# Patient Record
Sex: Female | Born: 1935 | Race: White | Hispanic: No | Marital: Single | Smoking: Never smoker
Health system: Southern US, Community
[De-identification: ages and names within clinical notes are randomized; demographics above are authoritative.]

## PROBLEM LIST (undated history)

## (undated) DIAGNOSIS — M810 Age-related osteoporosis without current pathological fracture: Secondary | ICD-10-CM

## (undated) HISTORY — PX: OTHER SURGICAL HISTORY: SHX169

## (undated) HISTORY — PX: ABDOMINAL SURGERY: SHX537

---

## 2017-06-25 ENCOUNTER — Inpatient Hospital Stay (HOSPITAL_COMMUNITY): Payer: Medicare Other

## 2017-06-25 ENCOUNTER — Inpatient Hospital Stay (HOSPITAL_COMMUNITY)
Admission: EM | Admit: 2017-06-25 | Discharge: 2017-06-28 | DRG: 480 | Disposition: A | Discharge status: Skilled Nursing Facility | Payer: Medicare Other | Attending: Internal Medicine | Admitting: Internal Medicine

## 2017-06-25 ENCOUNTER — Encounter (HOSPITAL_COMMUNITY): Payer: Self-pay

## 2017-06-25 ENCOUNTER — Other Ambulatory Visit: Payer: Self-pay

## 2017-06-25 ENCOUNTER — Emergency Department (HOSPITAL_COMMUNITY): Payer: Medicare Other

## 2017-06-25 ENCOUNTER — Inpatient Hospital Stay (HOSPITAL_COMMUNITY): Payer: Medicare Other | Admitting: Certified Registered"

## 2017-06-25 ENCOUNTER — Encounter (HOSPITAL_COMMUNITY)
Admission: EM | Disposition: A | Discharge status: Skilled Nursing Facility | Payer: Self-pay | Source: Home / Self Care | Attending: Internal Medicine

## 2017-06-25 DIAGNOSIS — D696 Thrombocytopenia, unspecified: Secondary | ICD-10-CM | POA: Diagnosis present

## 2017-06-25 DIAGNOSIS — W010XXA Fall on same level from slipping, tripping and stumbling without subsequent striking against object, initial encounter: Secondary | ICD-10-CM | POA: Diagnosis present

## 2017-06-25 DIAGNOSIS — D72829 Elevated white blood cell count, unspecified: Secondary | ICD-10-CM | POA: Diagnosis present

## 2017-06-25 DIAGNOSIS — E43 Unspecified severe protein-calorie malnutrition: Secondary | ICD-10-CM | POA: Diagnosis present

## 2017-06-25 DIAGNOSIS — S72009A Fracture of unspecified part of neck of unspecified femur, initial encounter for closed fracture: Secondary | ICD-10-CM | POA: Diagnosis present

## 2017-06-25 DIAGNOSIS — S72001A Fracture of unspecified part of neck of right femur, initial encounter for closed fracture: Secondary | ICD-10-CM | POA: Diagnosis not present

## 2017-06-25 DIAGNOSIS — S72141A Displaced intertrochanteric fracture of right femur, initial encounter for closed fracture: Principal | ICD-10-CM | POA: Diagnosis present

## 2017-06-25 DIAGNOSIS — Z79899 Other long term (current) drug therapy: Secondary | ICD-10-CM

## 2017-06-25 DIAGNOSIS — Z09 Encounter for follow-up examination after completed treatment for conditions other than malignant neoplasm: Secondary | ICD-10-CM

## 2017-06-25 DIAGNOSIS — D62 Acute posthemorrhagic anemia: Secondary | ICD-10-CM | POA: Diagnosis present

## 2017-06-25 DIAGNOSIS — Y92008 Other place in unspecified non-institutional (private) residence as the place of occurrence of the external cause: Secondary | ICD-10-CM | POA: Diagnosis not present

## 2017-06-25 DIAGNOSIS — Z681 Body mass index (BMI) 19 or less, adult: Secondary | ICD-10-CM

## 2017-06-25 DIAGNOSIS — Z8781 Personal history of (healed) traumatic fracture: Secondary | ICD-10-CM

## 2017-06-25 HISTORY — PX: INTRAMEDULLARY (IM) NAIL INTERTROCHANTERIC: SHX5875

## 2017-06-25 LAB — COMPREHENSIVE METABOLIC PANEL
ALBUMIN: 3.8 g/dL (ref 3.5–5.0)
ALT: 21 U/L (ref 14–54)
AST: 28 U/L (ref 15–41)
Alkaline Phosphatase: 49 U/L (ref 38–126)
Anion gap: 8 (ref 5–15)
BILIRUBIN TOTAL: 0.9 mg/dL (ref 0.3–1.2)
BUN: 30 mg/dL — ABNORMAL HIGH (ref 6–20)
CHLORIDE: 104 mmol/L (ref 101–111)
CO2: 26 mmol/L (ref 22–32)
Calcium: 8.9 mg/dL (ref 8.9–10.3)
Creatinine, Ser: 0.66 mg/dL (ref 0.44–1.00)
GFR calc Af Amer: >60 mL/min (ref >60)
GLUCOSE: 150 mg/dL — AB (ref 65–99)
POTASSIUM: 3.7 mmol/L (ref 3.5–5.1)
Sodium: 138 mmol/L (ref 135–145)
TOTAL PROTEIN: 6.1 g/dL — AB (ref 6.5–8.1)

## 2017-06-25 LAB — CBC WITH DIFFERENTIAL/PLATELET
BASOS ABS: 0 10*3/uL (ref 0.0–0.1)
Basophils Relative: 0 %
Eosinophils Absolute: 0 10*3/uL (ref 0.0–0.7)
Eosinophils Relative: 0 %
HEMATOCRIT: 35 % — AB (ref 36.0–46.0)
HEMOGLOBIN: 11.6 g/dL — AB (ref 12.0–15.0)
LYMPHS PCT: 4 %
Lymphs Abs: 0.6 10*3/uL — ABNORMAL LOW (ref 0.7–4.0)
MCH: 31.1 pg (ref 26.0–34.0)
MCHC: 33.1 g/dL (ref 30.0–36.0)
MCV: 93.8 fL (ref 78.0–100.0)
Monocytes Absolute: 0.7 10*3/uL (ref 0.1–1.0)
Monocytes Relative: 5 %
NEUTROS ABS: 13.4 10*3/uL — AB (ref 1.7–7.7)
Neutrophils Relative %: 91 %
Platelets: 137 10*3/uL — ABNORMAL LOW (ref 150–400)
RBC: 3.73 MIL/uL — AB (ref 3.87–5.11)
RDW: 12.3 % (ref 11.5–15.5)
WBC: 14.8 10*3/uL — AB (ref 4.0–10.5)

## 2017-06-25 LAB — IRON AND TIBC
IRON: 79 ug/dL (ref 28–170)
Saturation Ratios: 23 % (ref 10.4–31.8)
TIBC: 349 ug/dL (ref 250–450)
UIBC: 270 ug/dL

## 2017-06-25 LAB — FERRITIN: FERRITIN: 148 ng/mL (ref 11–307)

## 2017-06-25 SURGERY — FIXATION, FRACTURE, INTERTROCHANTERIC, WITH INTRAMEDULLARY ROD
Anesthesia: Spinal | Laterality: Right

## 2017-06-25 MED ORDER — ONDANSETRON HCL 4 MG/2ML IJ SOLN
INTRAMUSCULAR | Status: AC
  Filled 2017-06-25: qty 2

## 2017-06-25 MED ORDER — CEFAZOLIN SODIUM-DEXTROSE 2-4 GM/100ML-% IV SOLN
2.0000 g | Freq: Four times a day (QID) | INTRAVENOUS | Status: AC
  Administered 2017-06-26 (×2): 2 g via INTRAVENOUS
  Filled 2017-06-25 (×2): qty 100

## 2017-06-25 MED ORDER — ENOXAPARIN SODIUM 40 MG/0.4ML ~~LOC~~ SOLN: 40.0000 mg | SUBCUTANEOUS | Status: DC

## 2017-06-25 MED ORDER — FENTANYL CITRATE (PF) 100 MCG/2ML IJ SOLN
INTRAMUSCULAR | Status: DC | PRN
  Administered 2017-06-25 (×2): 25 ug via INTRAVENOUS
  Administered 2017-06-25: 50 ug via INTRAVENOUS

## 2017-06-25 MED ORDER — ONDANSETRON HCL 4 MG/2ML IJ SOLN: 4.0000 mg | Freq: Once | INTRAMUSCULAR | Status: DC | PRN

## 2017-06-25 MED ORDER — SODIUM CHLORIDE 0.9 % IR SOLN
Status: DC | PRN
  Administered 2017-06-25: 1000 mL

## 2017-06-25 MED ORDER — ENOXAPARIN SODIUM 40 MG/0.4ML ~~LOC~~ SOLN
40.0000 mg | SUBCUTANEOUS | Status: DC
  Administered 2017-06-26 – 2017-06-28 (×3): 40 mg via SUBCUTANEOUS
  Filled 2017-06-25 (×3): qty 0.4

## 2017-06-25 MED ORDER — DOCUSATE SODIUM 100 MG PO CAPS
100.0000 mg | ORAL_CAPSULE | Freq: Two times a day (BID) | ORAL | Status: DC
  Administered 2017-06-26 – 2017-06-27 (×3): 100 mg via ORAL
  Filled 2017-06-25 (×5): qty 1

## 2017-06-25 MED ORDER — CEFAZOLIN SODIUM-DEXTROSE 2-4 GM/100ML-% IV SOLN
2.0000 g | INTRAVENOUS | Status: AC
  Administered 2017-06-25: 2 g via INTRAVENOUS

## 2017-06-25 MED ORDER — MENTHOL 3 MG MT LOZG: 1.0000 | LOZENGE | OROMUCOSAL | Status: DC | PRN

## 2017-06-25 MED ORDER — POVIDONE-IODINE 10 % EX SWAB: 2.0000 "application " | Freq: Once | CUTANEOUS | Status: DC

## 2017-06-25 MED ORDER — MEPERIDINE HCL 50 MG/ML IJ SOLN: 6.2500 mg | INTRAMUSCULAR | Status: DC | PRN

## 2017-06-25 MED ORDER — SODIUM CHLORIDE 0.9 % IV BOLUS
500.0000 mL | Freq: Once | INTRAVENOUS | Status: AC
  Administered 2017-06-25: 500 mL via INTRAVENOUS

## 2017-06-25 MED ORDER — FENTANYL CITRATE (PF) 100 MCG/2ML IJ SOLN
50.0000 ug | Freq: Once | INTRAMUSCULAR | Status: AC
  Administered 2017-06-25: 50 ug via INTRAVENOUS
  Filled 2017-06-25: qty 2

## 2017-06-25 MED ORDER — HYDROCODONE-ACETAMINOPHEN 5-325 MG PO TABS
1.0000 | ORAL_TABLET | Freq: Four times a day (QID) | ORAL | Status: DC | PRN
  Administered 2017-06-26: 1 via ORAL
  Filled 2017-06-25: qty 1

## 2017-06-25 MED ORDER — ACETAMINOPHEN 325 MG PO TABS
325.0000 mg | ORAL_TABLET | Freq: Four times a day (QID) | ORAL | Status: DC | PRN
  Administered 2017-06-28 (×2): 650 mg via ORAL
  Filled 2017-06-25 (×2): qty 2

## 2017-06-25 MED ORDER — CHLORHEXIDINE GLUCONATE 4 % EX LIQD
60.0000 mL | Freq: Once | CUTANEOUS | Status: DC
  Filled 2017-06-25: qty 60

## 2017-06-25 MED ORDER — METOCLOPRAMIDE HCL 5 MG PO TABS: 5.0000 mg | ORAL_TABLET | Freq: Three times a day (TID) | ORAL | Status: DC | PRN

## 2017-06-25 MED ORDER — SODIUM CHLORIDE 0.9 % IV SOLN
INTRAVENOUS | Status: DC | PRN
  Administered 2017-06-25: 20:00:00 via INTRAVENOUS

## 2017-06-25 MED ORDER — CEFAZOLIN SODIUM-DEXTROSE 2-4 GM/100ML-% IV SOLN: 2.0000 g | Freq: Four times a day (QID) | INTRAVENOUS | Status: DC

## 2017-06-25 MED ORDER — FENTANYL CITRATE (PF) 100 MCG/2ML IJ SOLN
25.0000 ug | INTRAMUSCULAR | Status: DC | PRN
  Administered 2017-06-25 (×2): 25 ug via INTRAVENOUS

## 2017-06-25 MED ORDER — ONDANSETRON HCL 4 MG/2ML IJ SOLN: 4.0000 mg | Freq: Four times a day (QID) | INTRAMUSCULAR | Status: DC | PRN

## 2017-06-25 MED ORDER — PROPOFOL 10 MG/ML IV BOLUS
INTRAVENOUS | Status: AC
  Filled 2017-06-25: qty 20

## 2017-06-25 MED ORDER — PROPOFOL 500 MG/50ML IV EMUL
INTRAVENOUS | Status: DC | PRN
  Administered 2017-06-25: 50 ug/kg/min via INTRAVENOUS

## 2017-06-25 MED ORDER — PHENYLEPHRINE 40 MCG/ML (10ML) SYRINGE FOR IV PUSH (FOR BLOOD PRESSURE SUPPORT)
PREFILLED_SYRINGE | INTRAVENOUS | Status: DC | PRN
  Administered 2017-06-25: 40 ug via INTRAVENOUS
  Administered 2017-06-25: 80 ug via INTRAVENOUS
  Administered 2017-06-25: 40 ug via INTRAVENOUS

## 2017-06-25 MED ORDER — LIDOCAINE 2% (20 MG/ML) 5 ML SYRINGE
INTRAMUSCULAR | Status: DC | PRN
  Administered 2017-06-25: 100 mg via INTRAVENOUS

## 2017-06-25 MED ORDER — POLYETHYLENE GLYCOL 3350 17 G PO PACK: 17.0000 g | PACK | Freq: Every day | ORAL | Status: DC | PRN

## 2017-06-25 MED ORDER — FENTANYL CITRATE (PF) 100 MCG/2ML IJ SOLN
INTRAMUSCULAR | Status: AC
Start: 2017-06-25 — End: ?
  Filled 2017-06-25: qty 2

## 2017-06-25 MED ORDER — CEFAZOLIN SODIUM-DEXTROSE 2-4 GM/100ML-% IV SOLN
INTRAVENOUS | Status: AC
  Filled 2017-06-25: qty 100

## 2017-06-25 MED ORDER — ONDANSETRON HCL 4 MG/2ML IJ SOLN
INTRAMUSCULAR | Status: DC | PRN
  Administered 2017-06-25: 4 mg via INTRAVENOUS

## 2017-06-25 MED ORDER — PHENOL 1.4 % MT LIQD
1.0000 | OROMUCOSAL | Status: DC | PRN
  Filled 2017-06-25: qty 177

## 2017-06-25 MED ORDER — METOCLOPRAMIDE HCL 5 MG/ML IJ SOLN: 5.0000 mg | Freq: Three times a day (TID) | INTRAMUSCULAR | Status: DC | PRN

## 2017-06-25 MED ORDER — BUPIVACAINE IN DEXTROSE 0.75-8.25 % IT SOLN
INTRATHECAL | Status: DC | PRN
  Administered 2017-06-25: 1.5 mL via INTRATHECAL

## 2017-06-25 MED ORDER — FENTANYL CITRATE (PF) 100 MCG/2ML IJ SOLN
INTRAMUSCULAR | Status: AC
  Filled 2017-06-25: qty 2

## 2017-06-25 MED ORDER — PROPOFOL 10 MG/ML IV BOLUS
INTRAVENOUS | Status: AC
Start: 2017-06-25 — End: ?
  Filled 2017-06-25: qty 20

## 2017-06-25 MED ORDER — ONDANSETRON HCL 4 MG PO TABS: 4.0000 mg | ORAL_TABLET | Freq: Four times a day (QID) | ORAL | Status: DC | PRN

## 2017-06-25 MED ORDER — MORPHINE SULFATE (PF) 2 MG/ML IV SOLN
0.5000 mg | INTRAVENOUS | Status: DC | PRN
Start: 2017-06-25 — End: 2017-06-28

## 2017-06-25 MED ORDER — PROPOFOL 10 MG/ML IV BOLUS
INTRAVENOUS | Status: DC | PRN
  Administered 2017-06-25: 10 mg via INTRAVENOUS
  Administered 2017-06-25: 30 mg via INTRAVENOUS

## 2017-06-25 MED ORDER — SODIUM CHLORIDE 0.9 % IV SOLN
INTRAVENOUS | Status: DC | PRN
  Administered 2017-06-25: 100 mL via INTRAVENOUS

## 2017-06-25 SURGICAL SUPPLY — 34 items
BAG ZIPLOCK 12X15 (MISCELLANEOUS) IMPLANT
BIT DRILL CANN LG 4.3MM (BIT) ×1 IMPLANT
CHLORAPREP W/TINT 26ML (MISCELLANEOUS) ×2 IMPLANT
COVER PERINEAL POST (MISCELLANEOUS) ×2 IMPLANT
COVER SURGICAL LIGHT HANDLE (MISCELLANEOUS) ×2 IMPLANT
DERMABOND ADVANCED (GAUZE/BANDAGES/DRESSINGS) ×1
DERMABOND ADVANCED .7 DNX12 (GAUZE/BANDAGES/DRESSINGS) ×1 IMPLANT
DRAPE C-ARM 42X120 X-RAY (DRAPES) ×2 IMPLANT
DRAPE C-ARMOR (DRAPES) ×2 IMPLANT
DRAPE SHEET LG 3/4 BI-LAMINATE (DRAPES) ×2 IMPLANT
DRAPE STERI IOBAN 125X83 (DRAPES) ×2 IMPLANT
DRAPE U-SHAPE 47X51 STRL (DRAPES) ×4 IMPLANT
DRILL BIT CANN LG 4.3MM (BIT) ×2
DRSG MEPILEX BORDER 4X4 (GAUZE/BANDAGES/DRESSINGS) ×2 IMPLANT
DRSG MEPILEX BORDER 4X8 (GAUZE/BANDAGES/DRESSINGS) ×2 IMPLANT
ELECT BLADE TIP CTD 4 INCH (ELECTRODE) IMPLANT
FACESHIELD WRAPAROUND (MASK) ×2 IMPLANT
GAUZE SPONGE 4X4 12PLY STRL (GAUZE/BANDAGES/DRESSINGS) ×2 IMPLANT
GLOVE BIO SURGEON STRL SZ8.5 (GLOVE) ×4 IMPLANT
GLOVE BIOGEL PI IND STRL 8.5 (GLOVE) ×1 IMPLANT
GLOVE BIOGEL PI INDICATOR 8.5 (GLOVE) ×1
GOWN SPEC L3 XXLG W/TWL (GOWN DISPOSABLE) ×2 IMPLANT
GUIDEPIN 3.2X17.5 THRD DISP (PIN) ×2 IMPLANT
HIP FRA NAIL LAG SCREW 10.5X90 (Orthopedic Implant) ×2 IMPLANT
KIT BASIN OR (CUSTOM PROCEDURE TRAY) ×2 IMPLANT
MANIFOLD NEPTUNE II (INSTRUMENTS) ×2 IMPLANT
MARKER SKIN DUAL TIP RULER LAB (MISCELLANEOUS) ×2 IMPLANT
NAIL HIP FRACT 130D 11X180 (Screw) ×2 IMPLANT
PACK TOTAL JOINT WL LF (CUSTOM PROCEDURE TRAY) ×2 IMPLANT
SCREW BONE CORTICAL 5.0X3 (Screw) ×2 IMPLANT
SCREW LAG HIP FRA NAIL 10.5X90 (Orthopedic Implant) ×1 IMPLANT
SUT MNCRL AB 3-0 PS2 18 (SUTURE) ×2 IMPLANT
SUT VIC AB 1 CT1 36 (SUTURE) ×2 IMPLANT
YANKAUER SUCT BULB TIP NO VENT (SUCTIONS) IMPLANT

## 2017-06-25 NOTE — ED Notes (Signed)
Orthopedic MD at bedside with patient and family. 

## 2017-06-25 NOTE — Anesthesia Postprocedure Evaluation (Signed)
Anesthesia Post Note  Patient: Toya Smothersileen Mae Needles  Procedure(s) Performed: INTRAMEDULLARY (IM) NAIL INTERTROCHANTRIC (Right )     Patient location during evaluation: PACU Anesthesia Type: Spinal Level of consciousness: oriented and awake and alert Pain management: pain level controlled Vital Signs Assessment: post-procedure vital signs reviewed and stable Respiratory status: spontaneous breathing, respiratory function stable and nonlabored ventilation Cardiovascular status: blood pressure returned to baseline and stable Postop Assessment: no headache, no backache, no apparent nausea or vomiting, patient able to bend at knees and spinal receding Anesthetic complications: no    Last Vitals:  Vitals:   06/25/17 2200 06/25/17 2218  BP: (!) 129/54 134/60  Pulse: 68 72  Resp:  16  Temp: 36.7 C (!) 36.3 C  SpO2: 99% 100%    Last Pain:  Vitals:   06/25/17 2200  TempSrc:   PainSc: Asleep                 Jesseka Drinkard A.

## 2017-06-25 NOTE — Op Note (Signed)
OPERATIVE REPORT  SURGEON: Samson FredericBrian Kerryn Tennant, MD   ASSISTANT: Staff.  PREOPERATIVE DIAGNOSIS: Right intertrochanteric femur fracture.   POSTOPERATIVE DIAGNOSIS: Right intertrochanteric femur fracture.   PROCEDURE: Intramedullary fixation, Right femur.   IMPLANTS: Biomet Affixus Hip Fracture Nail, 11 by 180 mm, 130 degrees. 10.5 x 90 mm Hip Fracture Nail Lag Screw. 5 x 34 mm distal interlocking screw 1.  ANESTHESIA:  Spinal  ESTIMATED BLOOD LOSS:-50 mL    ANTIBIOTICS: 2 g Ancef.  DRAINS: None.  COMPLICATIONS: None.   CONDITION: PACU - hemodynamically stable.Marland Kitchen.   BRIEF CLINICAL NOTE: Christine Jennings is a 84 y.o. female who presented with an intertrochanteric femur fracture. The patient was admitted to the hospitalist service and underwent perioperative risk stratification and medical optimization. The risks, benefits, and alternatives to the procedure were explained, and the patient elected to proceed.  PROCEDURE IN DETAIL: Surgical site was marked by myself. The patient was taken to the operating room and anesthesia was induced on the bed. [facility] and the nonoperative lower extremity was scissored underneath the operative side. The fracture was reduced with traction, internal rotation, and adduction. The hip was prepped and draped in the normal sterile surgical fashion. Timeout was called verifying side and site of surgery. Preop antibiotics were given with 60 minutes of beginning the procedure.  Fluoroscopy was used to define the patient's anatomy. A 4 cm incision was made just proximal to the tip of the greater trochanter. The awl was used to obtain the standard starting point for a trochanteric entry nail under fluoroscopic control. The guidepin was placed. The entry reamer was used to open the proximal femur.  On the back table, the nail was assembled onto the jig. The nail was placed into the femur without any difficulty. Through a  separate stab incision, the cannula was placed down to the bone in preparation for the cephalomedullary device. A guidepin was placed into the femoral head using AP and lateral fluoroscopy views. The pin was measured, and then reaming was performed to the appropriate depth. The lag screw was inserted to the appropriate depth. The fracture was compressed through the jig. The setscrew was tightened and then loosened one quarter turn. A separate stab incision was created, and the distal interlocking screw was placed using standard AO technique. The jig was removed. Final AP and lateral fluoroscopy views were obtained to confirm fracture reduction and hardware placement. Tip apex distance was appropriate. There was no chondral penetration.  The wounds were copiously irrigated with saline. The wound was closed in layers with #1 Vicryl for the fascia, 2-0 Monocryl for the deep dermal layer, and 3-0 Monocryl subcuticular stitch. Glue was applied to the skin. Once the glue was fully hardened, sterile dressing was applied. The patient was then awakened from anesthesia and taken to the PACU in stable condition. Sponge needle and instrument counts were correct at the end of the case 2. There were no known complications.  We will readmit the patient to the hospitalist. Weightbearing status will be weightbearing as tolerated with a walker. We will begin Lovenox for DVT prophylaxis. The patient will work with physical therapy and undergo disposition planning.

## 2017-06-25 NOTE — Consult Note (Signed)
ORTHOPAEDIC CONSULTATION  REQUESTING PHYSICIAN: Park, Bartholome Billarolyn J, MD  PCP:  Patient, No Pcp Per  Chief Complaint: Right hip pain, inability to weight-bear.  HPI: Christine Jennings is a 84 y.o. female who complains of right hip pain and inability to weight-bear following a ground-level fall in her driveway earlier today.  She does live alone.  She uses no assistive devices.  She was [facility], where x-rays revealed a displaced right intertrochanteric femur fracture.  Orthopedic consultation was placed for management of the fracture.  She was seen by the hospitalist for preoperative risk stratification and medical optimization, admission.  She denies other injuries.  Of note, she has grown sons that live out of state.  History reviewed. No pertinent past medical history. Past Surgical History:  Procedure Laterality Date  . ABDOMINAL SURGERY     Cyst Removal   . Left Breast Surgery Left    Social History   Socioeconomic History  . Marital status: Single    Spouse name: Not on file  . Number of children: Not on file  . Years of education: Not on file  . Highest education level: Not on file  Occupational History  . Not on file  Social Needs  . Financial resource strain: Not on file  . Food insecurity:    Worry: Not on file    Inability: Not on file  . Transportation needs:    Medical: Not on file    Non-medical: Not on file  Tobacco Use  . Smoking status: Never Smoker  . Smokeless tobacco: Never Used  Substance and Sexual Activity  . Alcohol use: Never    Frequency: Never  . Drug use: Never  . Sexual activity: Not Currently  Lifestyle  . Physical activity:    Days per week: Not on file    Minutes per session: Not on file  . Stress: Not on file  Relationships  . Social connections:    Talks on phone: Not on file    Gets together: Not on file    Attends religious service: Not on file    Active member of club or  organization: Not on file    Attends meetings of clubs or organizations: Not on file    Relationship status: Not on file  Other Topics Concern  . Not on file  Social History Narrative  . Not on file   History reviewed. No pertinent family history. No Known Allergies Prior to Admission medications   Medication Sig Start Date End Date Taking? Authorizing Provider  Ascorbic Acid (VITAMIN C PO) Take 1 tablet by mouth daily.   Yes [provider]  Cholecalciferol (VITAMIN D PO) Take 1 tablet by mouth daily.   Yes [provider]   Dg Chest 1 View  Result Date: 06/25/2017 CLINICAL DATA:  Fall today. EXAM: CHEST  1 VIEW COMPARISON:  None. FINDINGS: The heart size and mediastinal contours are within normal limits. Both lungs are clear. No pneumothorax or pleural effusion is noted. The visualized skeletal structures are unremarkable. IMPRESSION: No acute cardiopulmonary abnormality seen. Electronically Signed   By: Lupita RaiderJames  Green Jr, M.D.   On: 06/25/2017 17:52   Dg Knee Right Port  Result Date: 06/25/2017 CLINICAL DATA:  Right knee pain following a fall today. EXAM: PORTABLE RIGHT KNEE - 1-2 VIEW COMPARISON:  None. FINDINGS: Similar oblique portable AP and lateral views of the right knee demonstrate diffuse osteopenia. No fracture, dislocation or effusion  seen. IMPRESSION: No fracture or effusion. Electronically Signed   By: Beckie Salts M.D.   On: 06/25/2017 19:03   Dg Hip Unilat W Or Wo Pelvis 2-3 Views Right  Result Date: 06/25/2017 CLINICAL DATA:  Right hip pain after fall. EXAM: DG HIP (WITH OR WITHOUT PELVIS) 2-3V RIGHT COMPARISON:  None. FINDINGS: Moderately angulated fracture is seen involving the intertrochanteric region of the proximal right femur. Femoral head is situated within the acetabulum. IMPRESSION: Moderately angulated intertrochanteric fracture of the proximal right femur. Electronically Signed   By: Lupita Raider, M.D.   On: 06/25/2017 17:53    Positive ROS:  All other systems have been reviewed and were otherwise negative with the exception of those mentioned in the HPI and as above.  Physical Exam: General: Alert, no acute distress Cardiovascular: No pedal edema Respiratory: No cyanosis, no use of accessory musculature GI: No organomegaly, abdomen is soft and non-tender Skin: No lesions in the area of chief complaint Neurologic: Sensation intact distally Psychiatric: Patient is competent for consent with normal mood and affect Lymphatic: No axillary or cervical lymphadenopathy  MUSCULOSKELETAL: Examination of the right hip reveals no skin wounds or lesions.  She is shortened and externally rotated.  She has hypertrophic nails on her feet.  She has palpable pulses.  She has intact motor function dorsiflexion, plantarflexion, and great toe extension.  Strength is limited by hip pain.  Assessment: Displaced right intertrochanteric femur fracture  Plan: I discussed the findings with the patient and her neighbor.  I recommended intramedullary fixation of her right intertrochanteric femur fracture.  We discussed the risks, benefits, and alternatives.  Please see statement of risk.  We will plan for surgery tonight.  All questions solicited and answered.  The risks, benefits, and alternatives were discussed with the patient. There are risks associated with the surgery including, but not limited to, problems with anesthesia (death), infection, differences in leg length/angulation/rotation, fracture of bones, loosening or failure of implants, malunion, nonunion, hematoma (blood accumulation) which may require surgical drainage, blood clots, pulmonary embolism, nerve injury (foot drop), and blood vessel injury. The patient understands these risks and elects to proceed.   Jonette Pesa, MD Cell [phone]    06/25/2017 7:23 PM

## 2017-06-25 NOTE — ED Notes (Signed)
Bed: WA10 Expected date: 06/25/17 Expected time: 3:57 PM Means of arrival: Ambulance Comments: Fall

## 2017-06-25 NOTE — ED Notes (Signed)
Called OR and talked to helen RN

## 2017-06-25 NOTE — Transfer of Care (Signed)
Immediate Anesthesia Transfer of Care Note  Patient: Christine Jennings  Procedure(s) Performed: INTRAMEDULLARY (IM) NAIL INTERTROCHANTRIC (Right )  Patient Location: PACU  Anesthesia Type:Spinal  Level of Consciousness: awake, alert  and oriented  Airway & Oxygen Therapy: Patient Spontanous Breathing and Patient connected to face mask oxygen  Post-op Assessment: Report given to RN and Post -op Vital signs reviewed and stable  Post vital signs: Reviewed and stable  Last Vitals:  Vitals Value Taken Time  BP 128/64 06/25/2017  9:30 PM  Temp 36.8 C 06/25/2017  9:30 PM  Pulse 76 06/25/2017  9:30 PM  Resp 25 06/25/2017  9:30 PM  SpO2 100 % 06/25/2017  9:30 PM    Last Pain:  Vitals:   06/25/17 1620  TempSrc: Oral         Complications: No apparent anesthesia complications

## 2017-06-25 NOTE — ED Notes (Signed)
ED Provider at bedside. 

## 2017-06-25 NOTE — H&P (Signed)
History and Physical  Christine Jennings ZOX:[id number] DOB: 03-19-1927 DOA: 06/25/2017 1604   PCP: none  Chief Complaint: mechanical fall and hip fracture  HPI: Christine Jennings is a 84 y.o. female with no significant medical history other than remote abdominal cyst removal presented after falling in her driveway. She tripped over a large hickory nut and fell on her left side -- was found by her neighbor after about 2 hours. Patient reports she was not able to stand due to R leg weakness. No syncope or seizure activity or LOC. Denies head injury. Neighbor reports that patient was found lying on the driveway in a "twisted" position. Since the fallhas pain at R hip with movement. Of note, patient lives alone and has two adult sons that are out-of-state.   Review of Systems:  - no fevers/chills - no cough - no chest pain, dyspnea on exertion - no edema, PND, orthopnea - no nausea/vomiting; no tarry, melanotic or bloody stools - no dysuria, increased urinary frequency - no weight changes  Rest of systems reviewed are negative, except as per above history.   ED course:  HR 93; T 36.7; BP 149/72; RR 18; SO2 99% on RA; Received NS 500 cc bolus x 1. R lower extremity XR revealed closed fracture of proximal R femur.   History reviewed. No pertinent past medical history. Past Surgical History:  Procedure Laterality Date  . ABDOMINAL SURGERY     Cyst Removal   . Left Breast Surgery Left    Social History:  reports that she has never smoked. She has never used smokeless tobacco. She reports that she does not drink alcohol or use drugs.  No Known Allergies  History reviewed. No pertinent family history.   Prior to Admission medications   Not on File    Temp:  [98 F (36.7 C)] 98 F (36.7 C) (06/22 1620) Pulse Rate:  [90-97] 90 (06/22 1830) Resp:  [14-21] 21 (06/22 1830) BP: (132-149)/(66-81) 132/72 (06/22 1830) SpO2:  [97 %-99 %] 99 % (06/22 1830) Weight:  [49.9 kg (110 lb)]  49.9 kg (110 lb) (06/22 1616)  PHYSICAL EXAM:  BP 132/72   Pulse 90   Temp 98 F (36.7 C) (Oral)   Resp (!) 21   Ht 5\' 4"  (1.626 m)   Wt 49.9 kg (110 lb)   SpO2 99%   BMI 18.88 kg/m   GEN thin elderly caucasian female; lying flat in bed comfortably  HEENT NCAT EOM PERRL; clear oropharynx, no cervical LAD; moist mucus membranes  JVP estimated 5 cm H2O above RA; no HJR ; no carotid bruits b/l ;  CV regular normal rate; normal S1 and S2; no m/r/g or S3/S4; PMI non displaced; no parasternal heave  RESP CTA b/l; breathing unlabored and symmetric  ABD soft NT ND +normoactive BS  EXT warm throughout b/l; no peripheral edema b/l  PULSES  DP and radials intact b/l  SKIN/MSK no rashes or lesions R lower extremity: foot is everted and limb is shortened compared to L NEURO/PSYCH no focal deficits   LABS ON ADMISSION:  Basic Metabolic Panel: Recent Labs  Lab 06/25/17 1633  NA 138  K 3.7  CL 104  CO2 26  GLUCOSE 150*  BUN 30*  CREATININE 0.66  CALCIUM 8.9   CBC: Recent Labs  Lab 06/25/17 1633  WBC 14.8*  NEUTROABS 13.4*  HGB 11.6*  HCT 35.0*  MCV 93.8  PLT 137*   Liver Function Tests: Recent Labs  Lab 06/25/17 1633  AST 28  ALT 21  ALKPHOS 49  BILITOT 0.9  PROT 6.1*  ALBUMIN 3.8   No results for input(s): LIPASE, AMYLASE in the last 168 hours. No results for input(s): AMMONIA in the last 168 hours. Coagulation:  No results found for: INR, PROTIME No results found for: PTT Lactic Acid, Venous:  No results found for: LATICACIDVEN Cardiac Enzymes: No results for input(s): CKTOTAL, CKMB, CKMBINDEX, TROPONINI in the last 168 hours.  BNP (last 3 results) No results for input(s): PROBNP in the last 8760 hours. CBG: No results for input(s): GLUCAP in the last 168 hours.  Radiological Exams on Admission: Dg Chest 1 View  Result Date: 06/25/2017 CLINICAL DATA:  Fall today. EXAM: CHEST  1 VIEW COMPARISON:  None. FINDINGS: The heart size and mediastinal contours  are within normal limits. Both lungs are clear. No pneumothorax or pleural effusion is noted. The visualized skeletal structures are unremarkable. IMPRESSION: No acute cardiopulmonary abnormality seen. Electronically Signed   By: Lupita Raider, M.D.   On: 06/25/2017 17:52   Dg Hip Unilat W Or Wo Pelvis 2-3 Views Right  Result Date: 06/25/2017 CLINICAL DATA:  Right hip pain after fall. EXAM: DG HIP (WITH OR WITHOUT PELVIS) 2-3V RIGHT COMPARISON:  None. FINDINGS: Moderately angulated fracture is seen involving the intertrochanteric region of the proximal right femur. Femoral head is situated within the acetabulum. IMPRESSION: Moderately angulated intertrochanteric fracture of the proximal right femur. Electronically Signed   By: Lupita Raider, M.D.   On: 06/25/2017 17:53    EKG: Independently reviewed. NSR with normal ST baseline  Assessment/Plan [name] is a 84 y.o. female with no significant past medical history who presents after mechanical fall -- sustained R proximal femur fracture. Plan for OR 6/22 per orthopedics.    Active Problems:   Hip fracture (HCC)   # R hip fracture - OR on 6/22 as per orthopedics - pain control prn PO hydrocodone - PT/OT ordered for next day POD #1 - admit to orthopedics unit  # Anemia normocytic. Unknown baseline. Likely anemia of inflammation vs IDA - iron studies pending  # Leukocytosis- likely inflammatory in setting of hip fracture. Normal CXR and afebrile - monitor daily CBC - pre-op prophylactic cefazolin x 2 doses  DVT Prophylaxis:  lovenox Code Status:  Full Code Family Communication: neighbor Cherene Julian) at bedside; patient has not yet called her adult sons who live out of state  Disposition Plan: admit to med/surg floor post op  Extended Emergency Contact Information Primary Emergency Contact: Cherene Julian Mobile Phone: [phone] Relation: Other  Time spent: > 35 minutes  Ike Bene, MD [facility] Pager [phone]  If 7PM-7AM, please contact night-coverage [email or web address] Password [email or web address] 06/25/2017, 6:49 PM

## 2017-06-25 NOTE — Anesthesia Preprocedure Evaluation (Signed)
Anesthesia Evaluation  Patient identified by MRN, date of birth, ID band Patient awake    Reviewed: Allergy & Precautions, NPO status , Patient's Chart, lab work & pertinent test results  Airway Mallampati: II  TM Distance: >3 FB Neck ROM: Full    Dental  (+) Teeth Intact   Pulmonary neg pulmonary ROS,    Pulmonary exam normal breath sounds clear to auscultation       Cardiovascular negative cardio ROS Normal cardiovascular exam Rhythm:Regular Rate:Normal     Neuro/Psych negative neurological ROS  negative psych ROS   GI/Hepatic negative GI ROS, Neg liver ROS,   Endo/Other  Hyperglycemia  Renal/GU negative Renal ROS  negative genitourinary   Musculoskeletal Intertrochanteric right hip Fx   Abdominal   Peds  Hematology negative hematology ROS (+)   Anesthesia Other Findings   Reproductive/Obstetrics                             Anesthesia Physical Anesthesia Plan  ASA: II  Anesthesia Plan: Spinal   Post-op Pain Management:    Induction:   PONV Risk Score and Plan: 3 and Ondansetron, Propofol infusion and Treatment may vary due to age or medical condition  Airway Management Planned: Natural Airway, Nasal Cannula and Simple Face Mask  Additional Equipment:   Intra-op Plan:   Post-operative Plan:   Informed Consent: I have reviewed the patients History and Physical, chart, labs and discussed the procedure including the risks, benefits and alternatives for the proposed anesthesia with the patient or authorized representative who has indicated his/her understanding and acceptance.   Dental advisory given  Plan Discussed with: CRNA and Surgeon  Anesthesia Plan Comments:         Anesthesia Quick Evaluation

## 2017-06-25 NOTE — Discharge Instructions (Signed)
 Dr. Evella Kasal Adult Hip & Knee Specialist [facility] [address] [place], [place] [address] [phone]   POSTOPERATIVE DIRECTIONS    Hip Rehabilitation, Guidelines Following Surgery   WEIGHT BEARING Weight bearing as tolerated with assist device (walker, cane, etc) as directed, use it as long as suggested by your surgeon or therapist, typically at least 4-6 weeks.   HOME CARE INSTRUCTIONS  Remove items at home which could result in a fall. This includes throw rugs or furniture in walking pathways.  Continue medications as instructed at time of discharge.  You may have some home medications which will be placed on hold until you complete the course of blood thinner medication.  4 days after discharge, you may start showering. No tub baths or soaking your incisions. Do not put on socks or shoes without following the instructions of your caregivers.   Sit on chairs with arms. Use the chair arms to help push yourself up when arising.  Arrange for the use of a toilet seat elevator so you are not sitting low.   Walk with walker as instructed.  You may resume a sexual relationship in one month or when given the OK by your caregiver.  Use walker as long as suggested by your caregivers.  Avoid periods of inactivity such as sitting longer than an hour when not asleep. This helps prevent blood clots.  You may return to work once you are cleared by your surgeon.  Do not drive a car for 6 weeks or until released by your surgeon.  Do not drive while taking narcotics.  Wear elastic stockings for two weeks following surgery during the day but you may remove then at night.  Make sure you keep all of your appointments after your operation with all of your doctors and caregivers. You should call the office at the above phone number and make an appointment for approximately two weeks after the date of your surgery. Please pick up a stool softener and laxative  for home use as long as you are requiring pain medications.  ICE to the affected hip every three hours for 30 minutes at a time and then as needed for pain and swelling. Continue to use ice on the hip for pain and swelling from surgery. You may notice swelling that will progress down to the foot and ankle.  This is normal after surgery.  Elevate the leg when you are not up walking on it.   It is important for you to complete the blood thinner medication as prescribed by your doctor.  Continue to use the breathing machine which will help keep your temperature down.  It is common for your temperature to cycle up and down following surgery, especially at night when you are not up moving around and exerting yourself.  The breathing machine keeps your lungs expanded and your temperature down.  RANGE OF MOTION AND STRENGTHENING EXERCISES  These exercises are designed to help you keep full movement of your hip joint. Follow your caregiver's or physical therapist's instructions. Perform all exercises about fifteen times, three times per day or as directed. Exercise both hips, even if you have had only one joint replacement. These exercises can be done on a training (exercise) mat, on the floor, on a table or on a bed. Use whatever works the best and is most comfortable for you. Use music or television while you are exercising so that the exercises are a pleasant break in your day. This   will make your life better with the exercises acting as a break in routine you can look forward to.  Lying on your back, slowly slide your foot toward your buttocks, raising your knee up off the floor. Then slowly slide your foot back down until your leg is straight again.  Lying on your back spread your legs as far apart as you can without causing discomfort.  Lying on your side, raise your upper leg and foot straight up from the floor as far as is comfortable. Slowly lower the leg and repeat.  Lying on your back, tighten up the  muscle in the front of your thigh (quadriceps muscles). You can do this by keeping your leg straight and trying to raise your heel off the floor. This helps strengthen the largest muscle supporting your knee.  Lying on your back, tighten up the muscles of your buttocks both with the legs straight and with the knee bent at a comfortable angle while keeping your heel on the floor.   SKILLED REHAB INSTRUCTIONS: If the patient is transferred to a skilled rehab facility following release from the hospital, a list of the current medications will be sent to the facility for the patient to continue.  When discharged from the skilled rehab facility, please have the facility set up the patient's Home Health Physical Therapy prior to being released. Also, the skilled facility will be responsible for providing the patient with their medications at time of release from the facility to include their pain medication and their blood thinner medication. If the patient is still at the rehab facility at time of the two week follow up appointment, the skilled rehab facility will also need to assist the patient in arranging follow up appointment in our office and any transportation needs.  MAKE SURE YOU:  Understand these instructions.  Will watch your condition.  Will get help right away if you are not doing well or get worse.  Pick up stool softner and laxative for home use following surgery while on pain medications. Daily dry dressing changes as needed. In 4 days, you may remove your dressings and begin taking showers - no tub baths or soaking the incisions. Continue to use ice for pain and swelling after surgery. Do not use any lotions or creams on the incision until instructed by your surgeon.   

## 2017-06-25 NOTE — ED Triage Notes (Signed)
Pt comes from home. Pt arrived via [facility]. Pt fell in driveway and was in driveway for about 45 minutes and is complaining of right thigh pain. Pt did not hit head and has no other complaints. Pt did not allow EMS to start IV for pain. Pt is AOx4 and Ambulatory at baseline.

## 2017-06-25 NOTE — ED Notes (Signed)
Patient had pantyhose on, we removed them, but needed to cut them. She said it was okay for me to throw them away since we had to cut them from the hip down.

## 2017-06-25 NOTE — ED Provider Notes (Signed)
[facility]-EMERGENCY DEPT Provider Note   CSN: [id number] Arrival date & time: 06/25/17  1558     History   Chief Complaint Chief Complaint  Patient presents with  . Fall    HPI Daniqua Campoy Barsamian is a 84 y.o. female.  The history is provided by the patient. No language interpreter was used.  Fall    Lucile Hillmann Schumm is a 84 y.o. female who presents to the Emergency Department complaining of fall. She was walking in the driveway and slipped on a hickory nut and fell onto her left hip. She reports severe right hip pain. She did not hit her head or lose consciousness. She laid in the yard for two hours until a neighbor came by for assistance. She denies any medical problems and takes no medications. She has not been seen by a doctor for many years. Symptoms are severe and constant. History reviewed. No pertinent past medical history.  Patient Active Problem List   Diagnosis Date Noted  . Hip fracture (HCC) 06/25/2017  . Closed displaced intertrochanteric fracture of right femur (HCC) 06/25/2017    Past Surgical History:  Procedure Laterality Date  . ABDOMINAL SURGERY     Cyst Removal   . Left Breast Surgery Left      OB History   None      Home Medications    Prior to Admission medications   Medication Sig Start Date End Date Taking? Authorizing Provider  Ascorbic Acid (VITAMIN C PO) Take 1 tablet by mouth daily.   Yes [provider]  Cholecalciferol (VITAMIN D PO) Take 1 tablet by mouth daily.   Yes [provider]    Family History History reviewed. No pertinent family history.  Social History Social History   Tobacco Use  . Smoking status: Never Smoker  . Smokeless tobacco: Never Used  Substance Use Topics  . Alcohol use: Never    Frequency: Never  . Drug use: Never     Allergies   Patient has no known allergies.   Review of Systems Review of Systems  All other systems reviewed and are  negative.    Physical Exam Updated Vital Signs BP 132/69 (BP Location: Left Arm)   Pulse 73   Temp 98 F (36.7 C) (Oral)   Resp 15   Ht 5\' 4"  (1.626 m)   Wt 49.9 kg (110 lb)   SpO2 100%   BMI 18.88 kg/m   Physical Exam  Constitutional: She is oriented to person, place, and time. She appears well-developed.  thin  HENT:  Head: Normocephalic and atraumatic.  Cardiovascular: Normal rate and regular rhythm.  No murmur heard. Pulmonary/Chest: Effort normal and breath sounds normal. No respiratory distress.  Abdominal: Soft. There is no tenderness. There is no rebound and no guarding.  Musculoskeletal:  2+ DP pulses bilaterally. There is tenderness to palpation over the right upper thigh and hip. Right lower extremity is externally rotated and shortened.  Neurological: She is alert and oriented to person, place, and time.  Skin: Skin is warm and dry.  Psychiatric: She has a normal mood and affect. Her behavior is normal.  Nursing note and vitals reviewed.    ED Treatments / Results  Labs (all labs ordered are listed, but only abnormal results are displayed) Labs Reviewed  COMPREHENSIVE METABOLIC PANEL - Abnormal; Notable for the following components:      Result Value   Glucose, Bld 150 (*)    BUN 30 (*)  Total Protein 6.1 (*)    All other components within normal limits  CBC WITH DIFFERENTIAL/PLATELET - Abnormal; Notable for the following components:   WBC 14.8 (*)    RBC 3.73 (*)    Hemoglobin 11.6 (*)    HCT 35.0 (*)    Platelets 137 (*)    Neutro Abs 13.4 (*)    Lymphs Abs 0.6 (*)    All other components within normal limits  IRON AND TIBC  FERRITIN  URINALYSIS, ROUTINE W REFLEX MICROSCOPIC  CBC  BASIC METABOLIC PANEL  TYPE AND SCREEN    EKG EKG Interpretation  Date/Time:  Saturday June 25 2017 17:05:37 EDT Ventricular Rate:  93 PR Interval:    QRS Duration: 77 QT Interval:  366 QTC Calculation: 456 R Axis:   18 Text Interpretation:  Sinus  rhythm Low voltage, extremity leads Confirmed by Tilden Fossaees, Jayveon Convey [phone]([id number]) on 06/25/2017 5:14:39 PM   Radiology Dg Chest 1 View  Result Date: 06/25/2017 CLINICAL DATA:  Fall today. EXAM: CHEST  1 VIEW COMPARISON:  None. FINDINGS: The heart size and mediastinal contours are within normal limits. Both lungs are clear. No pneumothorax or pleural effusion is noted. The visualized skeletal structures are unremarkable. IMPRESSION: No acute cardiopulmonary abnormality seen. Electronically Signed   By: Lupita RaiderJames  Green Jr, M.D.   On: 06/25/2017 17:52   Dg Knee Right Port  Result Date: 06/25/2017 CLINICAL DATA:  Right knee pain following a fall today. EXAM: PORTABLE RIGHT KNEE - 1-2 VIEW COMPARISON:  None. FINDINGS: Similar oblique portable AP and lateral views of the right knee demonstrate diffuse osteopenia. No fracture, dislocation or effusion seen. IMPRESSION: No fracture or effusion. Electronically Signed   By: Beckie SaltsSteven  Reid M.D.   On: 06/25/2017 19:03   Dg C-arm 1-60 Min-no Report  Result Date: 06/25/2017 Fluoroscopy was utilized by the requesting physician.  No radiographic interpretation.   Dg Hip Unilat W Or Wo Pelvis 2-3 Views Right  Result Date: 06/25/2017 CLINICAL DATA:  Right hip pain after fall. EXAM: DG HIP (WITH OR WITHOUT PELVIS) 2-3V RIGHT COMPARISON:  None. FINDINGS: Moderately angulated fracture is seen involving the intertrochanteric region of the proximal right femur. Femoral head is situated within the acetabulum. IMPRESSION: Moderately angulated intertrochanteric fracture of the proximal right femur. Electronically Signed   By: Lupita RaiderJames  Green Jr, M.D.   On: 06/25/2017 17:53    Procedures Procedures (including critical care time)  Medications Ordered in ED Medications  HYDROcodone-acetaminophen (NORCO/VICODIN) 5-325 MG per tablet 1-2 tablet ( Oral MAR Unhold 06/25/17 2215)  morphine 2 MG/ML injection 0.5 mg ( Intravenous MAR Unhold 06/25/17 2215)  polyethylene glycol (MIRALAX /  GLYCOLAX) packet 17 g ( Oral MAR Unhold 06/25/17 2215)  docusate sodium (COLACE) capsule 100 mg (100 mg Oral Not Given 06/26/17 0001)  ondansetron (ZOFRAN) tablet 4 mg (has no administration in time range)    Or  ondansetron (ZOFRAN) injection 4 mg (has no administration in time range)  metoCLOPramide (REGLAN) tablet 5-10 mg (has no administration in time range)    Or  metoCLOPramide (REGLAN) injection 5-10 mg (has no administration in time range)  menthol-cetylpyridinium (CEPACOL) lozenge 3 mg (has no administration in time range)    Or  phenol (CHLORASEPTIC) mouth spray 1 spray (has no administration in time range)  enoxaparin (LOVENOX) injection 40 mg (has no administration in time range)  ceFAZolin (ANCEF) IVPB 2g/100 mL premix (has no administration in time range)  acetaminophen (TYLENOL) tablet 325-650 mg (has no administration in time range)  fentaNYL (SUBLIMAZE) 100 MCG/2ML injection (has no administration in time range)  0.9 %  sodium chloride infusion (has no administration in time range)  sodium chloride 0.9 % bolus 500 mL (0 mLs Intravenous Stopped 06/25/17 1827)  fentaNYL (SUBLIMAZE) injection 50 mcg (50 mcg Intravenous Given 06/25/17 1707)  ceFAZolin (ANCEF) IVPB 2g/100 mL premix (2 g Intravenous Given 06/25/17 2000)  ceFAZolin (ANCEF) 2-4 GM/100ML-% IVPB (  Override pull for Anesthesia 06/25/17 2000)     Initial Impression / Assessment and Plan / ED Course  I have reviewed the triage vital signs and the nursing notes.  Pertinent labs & imaging results that were available during my care of the patient were reviewed by me and considered in my medical decision making (see chart for details).    Patient from home for evaluation of injuries following a mechanical fall. She has a right injured stroke femur fracture. She is neurovascular intact on examination. Discussed with Dr. Linna Caprice with orthopedics who will see the patient and consult. Hospitalist consulted for admission. Patient  and friend updated of findings of studies recommendation for admission and they are in agreement with treatment plan.  Final Clinical Impressions(s) / ED Diagnoses   Final diagnoses:  Closed comminuted intertrochanteric fracture of proximal end of right femur (HCC)  S/P right hip fracture  Postop check    ED Discharge Orders    None       Tilden Fossa, MD 06/26/17 0041

## 2017-06-25 NOTE — Anesthesia Procedure Notes (Addendum)
Spinal  Patient location during procedure: OR End time: 06/25/2017 7:57 PM Staffing Anesthesiologist: Josephine Igo, MD Resident/CRNA: Noralyn Pick D, CRNA Performed: anesthesiologist and resident/CRNA  Preanesthetic Checklist Completed: patient identified, site marked, surgical consent, pre-op evaluation, timeout performed, IV checked, risks and benefits discussed and monitors and equipment checked Spinal Block Patient position: right lateral decubitus Prep: Betadine Patient monitoring: heart rate, continuous pulse ox, blood pressure and cardiac monitor Approach: midline Location: L3-4 Injection technique: single-shot Needle Needle type: Spinocan  Needle gauge: 22 G Needle length: 9 cm Additional Notes Expiration date of kit checked and confirmed. Patient tolerated procedure well, without complications.

## 2017-06-26 DIAGNOSIS — D72829 Elevated white blood cell count, unspecified: Secondary | ICD-10-CM

## 2017-06-26 DIAGNOSIS — S72141A Displaced intertrochanteric fracture of right femur, initial encounter for closed fracture: Principal | ICD-10-CM

## 2017-06-26 DIAGNOSIS — D696 Thrombocytopenia, unspecified: Secondary | ICD-10-CM

## 2017-06-26 DIAGNOSIS — D62 Acute posthemorrhagic anemia: Secondary | ICD-10-CM

## 2017-06-26 LAB — BASIC METABOLIC PANEL
Anion gap: 6 (ref 5–15)
BUN: 26 mg/dL — AB (ref 6–20)
CALCIUM: 8.1 mg/dL — AB (ref 8.9–10.3)
CO2: 28 mmol/L (ref 22–32)
Chloride: 106 mmol/L (ref 101–111)
Creatinine, Ser: 0.78 mg/dL (ref 0.44–1.00)
GFR calc Af Amer: >60 mL/min (ref >60)
GLUCOSE: 147 mg/dL — AB (ref 65–99)
POTASSIUM: 4.6 mmol/L (ref 3.5–5.1)
Sodium: 140 mmol/L (ref 135–145)

## 2017-06-26 LAB — CBC
HCT: 25.4 % — ABNORMAL LOW (ref 36.0–46.0)
Hemoglobin: 8.5 g/dL — ABNORMAL LOW (ref 12.0–15.0)
MCH: 31.5 pg (ref 26.0–34.0)
MCHC: 33.5 g/dL (ref 30.0–36.0)
MCV: 94.1 fL (ref 78.0–100.0)
PLATELETS: 118 10*3/uL — AB (ref 150–400)
RBC: 2.7 MIL/uL — ABNORMAL LOW (ref 3.87–5.11)
RDW: 12.5 % (ref 11.5–15.5)
WBC: 8.6 10*3/uL (ref 4.0–10.5)

## 2017-06-26 LAB — ABO/RH: ABO/RH(D): O NEG

## 2017-06-26 NOTE — Progress Notes (Signed)
   Subjective:  Patient reports pain as mild.  No complaints this am.  States she just woke up, but doing well.  Objective:   VITALS:   Vitals:   06/25/17 2218 06/25/17 2330 06/26/17 0224 06/26/17 0544  BP: 134/60 132/69 123/66 104/61  Pulse: 72 73 85 (!) 147  Resp: 16 15 16 15   Temp: (!) 97.4 F (36.3 C) 98 F (36.7 C) 98.2 F (36.8 C) 97.8 F (36.6 C)  TempSrc:  Oral Oral Oral  SpO2: 100% 100% 100% 100%  Weight:      Height:        Neurologically intact Neurovascular intact Sensation intact distally Intact pulses distally Dorsiflexion/Plantar flexion intact Incision: dressing C/D/I a   Lab Results  Component Value Date   WBC 8.6 06/26/2017   HGB 8.5 (L) 06/26/2017   HCT 25.4 (L) 06/26/2017   MCV 94.1 06/26/2017   PLT 118 (L) 06/26/2017   BMET    Component Value Date/Time   NA 140 06/26/2017 0508   K 4.6 06/26/2017 0508   CL 106 06/26/2017 0508   CO2 28 06/26/2017 0508   GLUCOSE 147 (H) 06/26/2017 0508   BUN 26 (H) 06/26/2017 0508   CREATININE 0.78 06/26/2017 0508   CALCIUM 8.1 (L) 06/26/2017 0508   GFRNONAA >60 06/26/2017 0508   GFRAA >60 06/26/2017 0508     Assessment/Plan: 1 Day Post-Op   Active Problems:   Hip fracture (HCC)   Closed displaced intertrochanteric fracture of right femur (HCC)   Advance diet Up with therapy WBAT RLE,  lovenox for DVT pps D/C planning pnding   Yolonda KidaJason Patrick Rogers 06/26/2017, 8:26 AM   Maryan RuedJason P Rogers, MD [phone]

## 2017-06-26 NOTE — Evaluation (Signed)
Physical Therapy Evaluation Patient Details Name: Christine Jennings MRN: [id number] DOB: [birth year] Today's Date: 06/26/2017   History of Present Illness  84 y.o. female s/p R femur intramedullary fixation after a fall in her driveway causinga  R intertrochanteric femur fx.   Clinical Impression  Pt is s/p R hip fracture with ORIF  resulting in the deficits listed below (see PT Problem List).  Pt will benefit from acute PT to increase their independence and safety with mobility to allow discharge to the venue listed below. Pt was very independent and lives alone at home PTA. Feel she would benefit from ST-SNF to help with that transition to home safely.      Follow Up Recommendations SNF    Equipment Recommendations  Rolling walker with 5" wheels    Recommendations for Other Services       Precautions / Restrictions Precautions Precautions: Fall Restrictions Weight Bearing Restrictions: Yes RLE Weight Bearing: Weight bearing as tolerated      Mobility  Bed Mobility Overal bed mobility: Needs Assistance Bed Mobility: Supine to Sit;Sit to Supine     Supine to sit: Mod assist;HOB elevated     General bed mobility comments: cues for sequecing for bed mobility, and assistance with upper body and RLE as well . Sat EOB for about  minutes with BP taken in sitting and reported slight dizziness , but improved over time.   Transfers Overall transfer level: Needs assistance Equipment used: Rolling walker (2 wheeled) Transfers: Sit to/from Stand Sit to Stand: Mod assist         General transfer comment: cues for hand placement , and assist to rise . slight diziness upon rising   Ambulation/Gait Ambulation/Gait assistance: +2 safety/equipment;+2 physical assistance;Mod assist(due to hypotensisve event with drop in BPs during ambulation, followed closesly with recliner) Gait Distance (Feet): 5 Feet Assistive device: Rolling walker (2 wheeled) Gait Pattern/deviations: Step-to  pattern     General Gait Details: educated and demo RW use and step to pattern, pt was slow but able to self progress the R LE for stepping . Gait distance limited due to drop in BP.   Stairs            Wheelchair Mobility    Modified Rankin (Stroke Patients Only)       Balance Overall balance assessment: Needs assistance Sitting-balance support: Bilateral upper extremity supported;Feet supported Sitting balance-Leahy Scale: Fair     Standing balance support: Bilateral upper extremity supported;During functional activity Standing balance-Leahy Scale: Poor                               Pertinent Vitals/Pain Pain Assessment: 0-10 Pain Score: 6  Pain Location: R hip and thigh, and soreness stiffness in her neck as well. Pain Descriptors / Indicators: Aching;Grimacing;Sore(with movement) Pain Intervention(s): Limited activity within patient's tolerance;Monitored during session;Heat applied;Ice applied(heat applied to her posterior neck, and ice on her hip )    Home Living Family/patient expects to be discharged to:: Private residence Living Arrangements: Alone Available Help at Discharge: Friend(s);Available PRN/intermittently Type of Home: House Home Access: Stairs to enter Entrance Stairs-Rails: None Entrance Stairs-Number of Steps: 3 or 1+1  Home Layout: One level Home Equipment: None Additional Comments: Pt has 2 sons - 1 lives in [place] and the other in [place]. Unlikely to be able to provide assistance at discharge    Prior Function Level of Independence: Independent  Comments: Independent with all ADL, IADL including driving and yardwork.  Appears pt did not use any assistive devices or DME     Hand Dominance   Dominant Hand: Right    Extremity/Trunk Assessment   Upper Extremity Assessment Upper Extremity Assessment: Defer to OT evaluation    Lower Extremity Assessment RLE Deficits / Details: s/p R femur fixation - post op weakness and  pain expected. Was able to perform AAROM for hip flexion and hip abduction 2/5       Communication   Communication: No difficulties  Cognition Arousal/Alertness: Awake/alert Behavior During Therapy: WFL for tasks assessed/performed Overall Cognitive Status: Within Functional Limits for tasks assessed                                        General Comments      Exercises Total Joint Exercises Ankle Circles/Pumps: AROM;10 reps;Both Quad Sets: AROM;10 reps;Both;Supine Gluteal Sets: Supine;Both;10 reps;AROM Heel Slides: AAROM;Right;10 reps;Supine Hip ABduction/ADduction: AAROM;10 reps;Supine;Right   Assessment/Plan    PT Assessment Patient needs continued PT services  PT Problem List Decreased strength;Decreased activity tolerance;Decreased range of motion;Decreased mobility       PT Treatment Interventions DME instruction;Gait training;Therapeutic activities;Therapeutic exercise;Stair training;Functional mobility training    PT Goals (Current goals can be found in the Care Plan section)  Acute Rehab PT Goals Patient Stated Goal: TO get back to being active and getting out  PT Goal Formulation: With patient Time For Goal Achievement: 07/10/17 Potential to Achieve Goals: Good    Frequency Min 5X/week   Barriers to discharge        Co-evaluation               AM-PAC PT "6 Clicks" Daily Activity  Outcome Measure Difficulty turning over in bed (including adjusting bedclothes, sheets and blankets)?: Unable Difficulty moving from lying on back to sitting on the side of the bed? : Unable Difficulty sitting down on and standing up from a chair with arms (e.g., wheelchair, bedside commode, etc,.)?: Unable Help needed moving to and from a bed to chair (including a wheelchair)?: Total Help needed walking in hospital room?: Total Help needed climbing 3-5 steps with a railing? : Total 6 Click Score: 6    End of Session Equipment Utilized During  Treatment: Gait belt Activity Tolerance: Other (comment)(limited by drop in BP ) Patient left: in chair;with call bell/phone within reach;with nursing/sitter in room;with family/visitor present;with chair alarm set(nurse in room due to drop in BP, and friends in room as well ) Nurse Communication: Mobility status PT Visit Diagnosis: Muscle weakness (generalized) (M62.81);Unsteadiness on feet (R26.81)    Time: 1445-1536 PT Time Calculation (min) (ACUTE ONLY): 51 min   Charges:   PT Evaluation $PT Eval Low Complexity: 1 Low PT Treatments $Gait Training: 8-22 mins $Therapeutic Activity: 8-22 mins   PT G Codes:        Marella Bile, PT Pager: [phone] 06/26/2017   Christine Jennings, Clois Dupes 06/26/2017, 4:32 PM

## 2017-06-26 NOTE — Progress Notes (Signed)
Patient ID: Christine Jennings, female   DOB: [birth year], 84 y.o.   MRN: [id number]  PROGRESS NOTE    Christine Jennings  WUJ:[id number] DOB: July 16, 1927 DOA: 06/25/2017 PCP: Patient, No Pcp Per   Brief Narrative:  84 year old female with no significant medical history presented on 06/25/2017 with mechanical fall and was found to have right hip fracture.  Orthopedics was consulted.  Patient underwent right hip surgery on 06/25/2017.   Assessment & Plan:   Active Problems:   Hip fracture (HCC)   Closed displaced intertrochanteric fracture of right femur (HCC)   Right intertrochanteric femur fracture after a mechanical fall -Status post right femur intramedullary fixation on 06/25/2017 by orthopedics -Wound care and activity as per orthopedics recommendations.  PT/OT eval.  Incentive spirometry.  Pain management -Social worker consult for probable placement  Leukocytosis -Reactive.  Resolved   Anemia with concern for acute blood loss anemia secondary to fracture and hip surgery -Monitor hemoglobin.  Transfuse if hemoglobin is less than 7 or there is an active evidence of bleeding.  Thrombocytopenia -Questionable cause.  Monitor.  DVT prophylaxis: Lovenox Code Status: Full Family Communication: None at bedside Disposition Plan: Probable rehab  Consultants: Orthopedics  Procedures: Right femur intramedullary fixation on 06/25/2017  Antimicrobials: None   Subjective: Patient seen and examined at bedside.  She denies any overnight fever, nausea or vomiting.  Complains of mild right hip pain.  Objective: Vitals:   06/25/17 2218 06/25/17 2330 06/26/17 0224 06/26/17 0544  BP: 134/60 132/69 123/66 104/61  Pulse: 72 73 85 (!) 147  Resp: 16 15 16 15   Temp: (!) 97.4 F (36.3 C) 98 F (36.7 C) 98.2 F (36.8 C) 97.8 F (36.6 C)  TempSrc:  Oral Oral Oral  SpO2: 100% 100% 100% 100%  Weight:      Height:        Intake/Output Summary (Last 24 hours) at 06/26/2017 0954 Last data  filed at 06/26/2017 0935 Gross per 24 hour  Intake 1410.83 ml  Output 465 ml  Net 945.83 ml   Filed Weights   06/25/17 1616  Weight: 49.9 kg (110 lb)    Examination:  General exam: Appears calm and comfortable.  Elderly female lying in bed Respiratory system: Bilateral decreased breath sound at bases Cardiovascular system: S1 & S2 heard, rate controlled currently  gastrointestinal system: Abdomen is nondistended, soft and nontender. Normal bowel sounds heard. Extremities: No cyanosis, clubbing, edema    Data Reviewed: I have personally reviewed following labs and imaging studies  CBC: Recent Labs  Lab 06/25/17 1633 06/26/17 0508  WBC 14.8* 8.6  NEUTROABS 13.4*  --   HGB 11.6* 8.5*  HCT 35.0* 25.4*  MCV 93.8 94.1  PLT 137* 118*   Basic Metabolic Panel: Recent Labs  Lab 06/25/17 1633 06/26/17 0508  NA 138 140  K 3.7 4.6  CL 104 106  CO2 26 28  GLUCOSE 150* 147*  BUN 30* 26*  CREATININE 0.66 0.78  CALCIUM 8.9 8.1*   GFR: Estimated Creatinine Clearance: 38.3 mL/min (by C-G formula based on SCr of 0.78 mg/dL). Liver Function Tests: Recent Labs  Lab 06/25/17 1633  AST 28  ALT 21  ALKPHOS 49  BILITOT 0.9  PROT 6.1*  ALBUMIN 3.8   No results for input(s): LIPASE, AMYLASE in the last 168 hours. No results for input(s): AMMONIA in the last 168 hours. Coagulation Profile: No results for input(s): INR, PROTIME in the last 168 hours. Cardiac Enzymes: No results for input(s): CKTOTAL, CKMB, CKMBINDEX,  TROPONINI in the last 168 hours. BNP (last 3 results) No results for input(s): PROBNP in the last 8760 hours. HbA1C: No results for input(s): HGBA1C in the last 72 hours. CBG: No results for input(s): GLUCAP in the last 168 hours. Lipid Profile: No results for input(s): CHOL, HDL, LDLCALC, TRIG, CHOLHDL, LDLDIRECT in the last 72 hours. Thyroid Function Tests: No results for input(s): TSH, T4TOTAL, FREET4, T3FREE, THYROIDAB in the last 72 hours. Anemia  Panel: Recent Labs    06/25/17 1712  FERRITIN 148  TIBC 349  IRON 79   Sepsis Labs: No results for input(s): PROCALCITON, LATICACIDVEN in the last 168 hours.  No results found for this or any previous visit (from the past 240 hour(s)).       Radiology Studies: Dg Chest 1 View  Result Date: 06/25/2017 CLINICAL DATA:  Fall today. EXAM: CHEST  1 VIEW COMPARISON:  None. FINDINGS: The heart size and mediastinal contours are within normal limits. Both lungs are clear. No pneumothorax or pleural effusion is noted. The visualized skeletal structures are unremarkable. IMPRESSION: No acute cardiopulmonary abnormality seen. Electronically Signed   By: Lupita Raider, M.D.   On: 06/25/2017 17:52   Pelvis Portable  Result Date: 06/26/2017 CLINICAL DATA:  Postop EXAM: PORTABLE PELVIS 1-2 VIEWS COMPARISON:  06/25/2017 FINDINGS: Interval intramedullary rod and screw fixation of the right femur for intertrochanteric fracture. Anatomic alignment. Moderate gas in the soft tissues of the right hip. Pubic symphysis and rami are intact IMPRESSION: Interval intramedullary rod and screw fixation of the right femur for intertrochanteric fracture with expected surgical changes Electronically Signed   By: Jasmine Pang M.D.   On: 06/26/2017 02:40   Dg Knee Right Port  Result Date: 06/25/2017 CLINICAL DATA:  Right knee pain following a fall today. EXAM: PORTABLE RIGHT KNEE - 1-2 VIEW COMPARISON:  None. FINDINGS: Similar oblique portable AP and lateral views of the right knee demonstrate diffuse osteopenia. No fracture, dislocation or effusion seen. IMPRESSION: No fracture or effusion. Electronically Signed   By: Beckie Salts M.D.   On: 06/25/2017 19:03   Dg C-arm 1-60 Min-no Report  Result Date: 06/25/2017 Fluoroscopy was utilized by the requesting physician.  No radiographic interpretation.   Dg Hip Operative Unilat W Or W/o Pelvis Right  Result Date: 06/26/2017 CLINICAL DATA:  Hip fracture EXAM: OPERATIVE  right HIP (WITH PELVIS IF PERFORMED) 2 VIEWS TECHNIQUE: Fluoroscopic spot image(s) were submitted for interpretation post-operatively. COMPARISON:  06/25/2017 FINDINGS: Total fluoroscopy was 53 seconds. Two low resolution intraoperative spot views of the right hip. Intramedullary rod and screw fixation of the right femur for intertrochanteric fracture. IMPRESSION: Intraoperative fluoroscopic assistance provided during right hip surgery Electronically Signed   By: Jasmine Pang M.D.   On: 06/26/2017 02:39   Dg Hip Unilat W Or Wo Pelvis 2-3 Views Right  Result Date: 06/25/2017 CLINICAL DATA:  Right hip pain after fall. EXAM: DG HIP (WITH OR WITHOUT PELVIS) 2-3V RIGHT COMPARISON:  None. FINDINGS: Moderately angulated fracture is seen involving the intertrochanteric region of the proximal right femur. Femoral head is situated within the acetabulum. IMPRESSION: Moderately angulated intertrochanteric fracture of the proximal right femur. Electronically Signed   By: Lupita Raider, M.D.   On: 06/25/2017 17:53        Scheduled Meds: . docusate sodium  100 mg Oral BID  . enoxaparin (LOVENOX) injection  40 mg Subcutaneous Q24H   Continuous Infusions: . sodium chloride 100 mL (06/25/17 2230)     LOS: 1  day        Glade LloydKshitiz Lainie Daubert, MD [facility] Pager [phone]  If 7PM-7AM, please contact night-coverage [email or web address] Password [email or web address] 06/26/2017, 9:54 AM

## 2017-06-26 NOTE — Clinical Social Work Note (Signed)
Clinical Social Work Assessment  Patient Details  Name: Christine Jennings MRN: [id number] Date of Birth: Oct 03, 1927  Date of referral:  06/26/17               Reason for consult:  Facility Placement                Permission sought to share information with:    Permission granted to share information::     Name::        Agency::     Relationship::     Contact Information:     Housing/Transportation Living arrangements for the past 2 months:  Single Family Home Source of Information:  Patient Patient Interpreter Needed:  None Criminal Activity/Legal Involvement Pertinent to Current Situation/Hospitalization:  No - Comment as needed Significant Relationships:  Adult Children, Friend Lives with:  Self Do you feel safe going back to the place where you live?  Yes Need for family participation in patient care:  No (Coment)(pt reports 2 sons live out if state and are not involved in daily needs )  Care giving concerns:  Pt admitted from home where she resides alone.  Was walking in driveway when she fell due to "tripping on a hickory nut" and sustained right femur fracture.  Had surgery 06/25/17.   Social Worker assessment / plan:  CSW consulted to assist with disposition due to anticipating pt may need SNF for rehab following DC. Met with pt at bedside-initially drowsy but became alert and engaged. Reports she understands nature of rehab at SNF as friends have been in the past. Reports prior to fall, she was independent and ambulatory. States she "is still in a lot of pain" but is anticipating working with PT (states she did work with OT) and is open to SNF if recommended by therapist and Psychologist, sport and exercise.  States she would like to discuss with her friends to see if they have specific facility recommendations. Requested CSW begin referral process in order to investigate options.  Will need PASRR and FL2- will compete once pt has definitively agreed to SNF placement. Made referrals- will follow  to continue assisting with disposition.   Plan: pt open to SNF placement, requests she discuss facilities with friends and participate in PT evaluation before finalizing plans. Referrals made however will need FL2 and PASRR.  Employment status:  Retired Forensic scientist:  Medicare(AARP supplement) PT Recommendations:  Not assessed at this time Information / Referral to community resources:  [place]  Patient/Family's Response to care:  Pt appreciative  Patient/Family's Understanding of and Emotional Response to Diagnosis, Current Treatment, and Prognosis:  Pt seems to have good understanding of her treatment and potential plans. Pt states, "I live alone and understand I may need help getting back on my feet from what they are telling me." Pt states she is disappointed at having fallen, states, "This happened at the worst time. I had just bought tickets to the concert series that starts Monday. That is my favorite thing to do in the summer in [place]." CSW validated pt's disappointment and pt states she "hopes this is just a small setback, the goal is to get my strength back and be up and about soon"  Emotional Assessment Appearance:  Appears stated age Attitude/Demeanor/Rapport:  Engaged(drowsy) Affect (typically observed):  Pleasant, Adaptable Orientation:  Oriented to Self, Oriented to Place, Oriented to  Time, Oriented to Situation Alcohol / Substance use:  Not Applicable Psych involvement (Current and /or in the community):  No (Comment)  Discharge Needs  Concerns to be addressed:  Discharge Planning Concerns Readmission within the last 30 days:  No Current discharge risk:  (assessing) Barriers to Discharge:  Continued Medical Work up   [employer], LCSW 06/26/2017, 3:19 PM  [phone] weekend coverage for [phone]

## 2017-06-26 NOTE — Evaluation (Signed)
Occupational Therapy Evaluation Patient Details Name: Christine Jennings MRN: [id number] DOB: 12/24/1927 Today's Date: 06/26/2017    History of Present Illness 84 y.o. female s/p R femur intramedullary fixation after a fall in her driveway causinga  R intertrochanteric femur fx.    Clinical Impression   PTA, pt was independent with all ADL, IADL (including driving) and functional mobility. Pt currently presents with R hip/thigh pain, balance deficits, generalized weakness, and dizziness/low BP. Pt performed bed mobility with mod assist and compensatory strategies and performed 1 sit-stand transfers with mod assist for boost to stand. Upon standing ~2 minutes, pt reported severe dizziness and sitting BP found to be 74/44. Pt was immediately returned to supine position in bed with relief of symptoms upon laying. Encouraged and set up pt to eat some breakfast and drink fluids as she had not yet. Pt has limited support upon d/c as her sons live out of town and her friends will likely not be able to provide the level of physical assistance that she currently needs. Recommend SNF for short-term rehab for pt to gain independence and strength with ADL and mobility prior to returning home. OT will continue to follow pt acutely.      Follow Up Recommendations  SNF;Supervision/Assistance - 24 hour    Equipment Recommendations  Other (comment)(TBD at next venue)    Recommendations for Other Services       Precautions / Restrictions Precautions Precautions: Fall Restrictions Weight Bearing Restrictions: Yes RLE Weight Bearing: Weight bearing as tolerated      Mobility Bed Mobility Overal bed mobility: Needs Assistance Bed Mobility: Supine to Sit;Sit to Supine     Supine to sit: Mod assist;HOB elevated Sit to supine: Max assist;+2 for physical assistance(d/t low BP)   General bed mobility comments: HOB elevated 60 degrees and use of bedrails. Assist provided to fully progress RLE off bed, to  support trunk to come into sitting EOB, and to scoot one hip at a time to sit safely EOB. Upon return to bed, max +2 assist required d/t pt experiencing low BP and severe dizziness.  Transfers Overall transfer level: Needs assistance Equipment used: Rolling walker (2 wheeled) Transfers: Sit to/from Stand Sit to Stand: Mod assist;From elevated surface         General transfer comment: Elevated bed and cues for safe hand placement. Mod assist provided for boost to stand. Pt reported dizziness after 2 minutes of standing - BP 74/44 (MAP 55). Session ended and pt was fully assisted back into bed/supine position. Pt reported relief from dizziness once supine.     Balance Overall balance assessment: Needs assistance Sitting-balance support: Bilateral upper extremity supported;Feet supported Sitting balance-Leahy Scale: Fair   Postural control: Left lateral lean(To offload painful R hip) Standing balance support: Bilateral upper extremity supported;During functional activity Standing balance-Leahy Scale: Poor Standing balance comment: Reliant on BUE support to maintain balance. Left lateral lean to offload painful RLE                           ADL either performed or assessed with clinical judgement   ADL Overall ADL's : Needs assistance/impaired Eating/Feeding: Set up;Bed level   Grooming: Min guard;Sitting   Upper Body Bathing: Minimal assistance;Sitting   Lower Body Bathing: Maximal assistance;Sit to/from stand   Upper Body Dressing : Minimal assistance;Sitting   Lower Body Dressing: Maximal assistance;Sit to/from stand   Toilet Transfer: Moderate assistance;+2 for safety/equipment;Stand-pivot;BSC;RW   Toileting- Clothing Manipulation and  Hygiene: Moderate assistance;Sit to/from stand       Functional mobility during ADLs: Moderate assistance;Rolling walker General ADL Comments: Pt very pleasant and agreeable to progress with therapy today. Pt able to progress to  EOB and stand, but session was stopped d/t dizziness upon standing as a result of low BP     Vision Baseline Vision/History: Wears glasses Wears Glasses: At all times(Has 2 pairs here with her) Patient Visual Report: No change from baseline Vision Assessment?: No apparent visual deficits     Perception     Praxis      Pertinent Vitals/Pain Pain Assessment: Faces Faces Pain Scale: Hurts even more Pain Location: R hip and thigh Pain Descriptors / Indicators: Aching;Grimacing;Sore Pain Intervention(s): Limited activity within patient's tolerance;Monitored during session;Repositioned;Premedicated before session     Hand Dominance Right   Extremity/Trunk Assessment Upper Extremity Assessment Upper Extremity Assessment: Generalized weakness   Lower Extremity Assessment Lower Extremity Assessment: Generalized weakness;RLE deficits/detail RLE Deficits / Details: s/p R femur fixation - post op weakness and pain expected RLE: Unable to fully assess due to pain RLE Sensation: WNL RLE Coordination: decreased gross motor   Cervical / Trunk Assessment Cervical / Trunk Assessment: Kyphotic   Communication Communication Communication: No difficulties   Cognition Arousal/Alertness: Awake/alert Behavior During Therapy: WFL for tasks assessed/performed Overall Cognitive Status: Within Functional Limits for tasks assessed                                     General Comments  BP upon initial stand 74/44 (MAP 55) and pt symptomatic with dizziness. Immediately returned to supine position and relief of symptoms reported.    Exercises     Shoulder Instructions      Home Living Family/patient expects to be discharged to:: Private residence Living Arrangements: Alone Available Help at Discharge: Friend(s);Available PRN/intermittently Type of Home: House Home Access: Stairs to enter [employer] of Steps: 3 Entrance Stairs-Rails: None Home Layout: One level      Bathroom Shower/Tub: Chief Strategy Officer: Standard         Additional Comments: Pt has 2 sons - 1 lives in [place] and the other in [place]. Unlikely to be able to provide assistance at discharge      Prior Functioning/Environment Level of Independence: Independent        Comments: Independent with all ADL, IADL including driving. Appears pt did not use any assistive devices or DME        OT Problem List: Decreased strength;Impaired balance (sitting and/or standing);Decreased range of motion;Decreased activity tolerance;Decreased safety awareness;Decreased knowledge of use of DME or AE;Decreased knowledge of precautions;Pain;Decreased coordination      OT Treatment/Interventions: Self-care/ADL training;Therapeutic exercise;DME and/or AE instruction;Therapeutic activities;Patient/family education;Balance training    OT Goals(Current goals can be found in the care plan section) Acute Rehab OT Goals Patient Stated Goal: To get better and have less pain OT Goal Formulation: With patient Time For Goal Achievement: 07/10/17 Potential to Achieve Goals: Good ADL Goals Pt Will Perform Grooming: with min guard assist;standing Pt Will Perform Upper Body Bathing: sitting;with supervision Pt Will Perform Lower Body Bathing: with min guard assist;sit to/from stand Pt Will Transfer to Toilet: with supervision;ambulating;bedside commode Pt Will Perform Toileting - Clothing Manipulation and hygiene: with supervision;sit to/from stand Additional ADL Goal #1: Pt will perform bed mobility with supervision and DME (e.g. bedrails) as needed.  OT Frequency: Min 2X/week  Barriers to D/C: Decreased caregiver support  Pt's sons live out of town and unsure if they can provide assist       Co-evaluation              AM-PAC PT "6 Clicks" Daily Activity     Outcome Measure Help from another person eating meals?: None Help from another person taking care of personal grooming?: A  Little Help from another person toileting, which includes using toliet, bedpan, or urinal?: A Lot Help from another person bathing (including washing, rinsing, drying)?: A Lot Help from another person to put on and taking off regular upper body clothing?: None Help from another person to put on and taking off regular lower body clothing?: A Lot 6 Click Score: 17   End of Session Equipment Utilized During Treatment: Gait belt;Rolling walker;Oxygen Nurse Communication: Mobility status;Precautions;Weight bearing status  Activity Tolerance: Treatment limited secondary to medical complications (Comment)(Dizziness and low BP) Patient left: in bed;with family/visitor present;with nursing/sitter in room;with SCD's reapplied;with call bell/phone within reach  OT Visit Diagnosis: Unsteadiness on feet (R26.81);Other abnormalities of gait and mobility (R26.89);Muscle weakness (generalized) (M62.81);Dizziness and giddiness (R42)                Time: 1610-9604 OT Time Calculation (min): 38 min Charges:  OT General Charges $OT Visit: 1 Visit OT Evaluation $OT Eval Moderate Complexity: 1 Mod OT Treatments $Self Care/Home Management : 23-37 mins G-Codes:     Nils Pyle, MS, OTR/L 06/26/2017, 10:22 AM

## 2017-06-27 ENCOUNTER — Encounter (HOSPITAL_COMMUNITY): Payer: Self-pay | Admitting: Orthopedic Surgery

## 2017-06-27 LAB — BASIC METABOLIC PANEL
Anion gap: 5 (ref 5–15)
BUN: 26 mg/dL — ABNORMAL HIGH (ref 6–20)
CALCIUM: 8.2 mg/dL — AB (ref 8.9–10.3)
CO2: 27 mmol/L (ref 22–32)
Chloride: 106 mmol/L (ref 101–111)
Creatinine, Ser: 0.6 mg/dL (ref 0.44–1.00)
GFR calc Af Amer: >60 mL/min (ref >60)
GLUCOSE: 121 mg/dL — AB (ref 65–99)
Potassium: 4.4 mmol/L (ref 3.5–5.1)
SODIUM: 138 mmol/L (ref 135–145)

## 2017-06-27 LAB — CBC
HCT: 23.4 % — ABNORMAL LOW (ref 36.0–46.0)
HEMOGLOBIN: 7.8 g/dL — AB (ref 12.0–15.0)
MCH: 32 pg (ref 26.0–34.0)
MCHC: 33.3 g/dL (ref 30.0–36.0)
MCV: 95.9 fL (ref 78.0–100.0)
PLATELETS: 104 10*3/uL — AB (ref 150–400)
RBC: 2.44 MIL/uL — AB (ref 3.87–5.11)
RDW: 12.7 % (ref 11.5–15.5)
WBC: 8.5 10*3/uL (ref 4.0–10.5)

## 2017-06-27 LAB — MAGNESIUM: Magnesium: 2.1 mg/dL (ref 1.7–2.4)

## 2017-06-27 MED ORDER — HYDROCODONE-ACETAMINOPHEN 5-325 MG PO TABS: 1.0000 | ORAL_TABLET | Freq: Four times a day (QID) | ORAL | 0 refills | Status: DC | PRN

## 2017-06-27 MED ORDER — ADULT MULTIVITAMIN W/MINERALS CH
1.0000 | ORAL_TABLET | Freq: Every day | ORAL | Status: DC
  Administered 2017-06-27 – 2017-06-28 (×2): 1 via ORAL
  Filled 2017-06-27 (×2): qty 1

## 2017-06-27 MED ORDER — ENSURE ENLIVE PO LIQD
237.0000 mL | Freq: Two times a day (BID) | ORAL | Status: DC
  Administered 2017-06-27 – 2017-06-28 (×3): 237 mL via ORAL

## 2017-06-27 MED ORDER — ENOXAPARIN SODIUM 40 MG/0.4ML ~~LOC~~ SOLN: 40.0000 mg | SUBCUTANEOUS | 0 refills | Status: DC

## 2017-06-27 NOTE — Plan of Care (Signed)
Patient struggling with mobility.  3 person assist from chair to bed.  Patient seems unable to assist.

## 2017-06-27 NOTE — Progress Notes (Signed)
    Subjective:  Patient reports pain as mild to moderate.  Denies N/V/CP/SOB. No c/o.  Objective:   VITALS:   Vitals:   06/26/17 2121 06/27/17 0604 06/27/17 1218 06/27/17 1314  BP: (!) 120/49 115/61 115/74 (!) 124/57  Pulse: 83 94 83 75  Resp: 15 15  15   Temp: 98.1 F (36.7 C) 98.5 F (36.9 C)  98.2 F (36.8 C)  TempSrc: Oral Oral  Oral  SpO2: 96% 98%  100%  Weight:      Height:        NAD ABD soft Sensation intact distally Intact pulses distally Dorsiflexion/Plantar flexion intact Incision: dressing C/D/I Compartment soft   Lab Results  Component Value Date   WBC 8.5 06/27/2017   HGB 7.8 (L) 06/27/2017   HCT 23.4 (L) 06/27/2017   MCV 95.9 06/27/2017   PLT 104 (L) 06/27/2017   BMET    Component Value Date/Time   NA 138 06/27/2017 0401   K 4.4 06/27/2017 0401   CL 106 06/27/2017 0401   CO2 27 06/27/2017 0401   GLUCOSE 121 (H) 06/27/2017 0401   BUN 26 (H) 06/27/2017 0401   CREATININE 0.60 06/27/2017 0401   CALCIUM 8.2 (L) 06/27/2017 0401   GFRNONAA >60 06/27/2017 0401   GFRAA >60 06/27/2017 0401     Assessment/Plan: 2 Days Post-Op   Active Problems:   Hip fracture (HCC)   Closed displaced intertrochanteric fracture of right femur (HCC)   WBAT with walker DVT ppx: Lovenox, SCDs, TEDS PO pain control PT/OT Dispo: D/C to SNF when ok with hospitalist, rec [facility]    Kenzleigh Sedam J Jilene Spohr 06/27/2017, 2:49 PM   Samson FredericBrian Dayvian Blixt, MD Cell [phone]

## 2017-06-27 NOTE — Progress Notes (Signed)
Initial Nutrition Assessment  DOCUMENTATION CODES:   Severe malnutrition in context of social or environmental circumstances  INTERVENTION:   - Ensure Enlive po BID, each supplement provides 350 kcal and 20 grams of protein  - MVI with minerals daily  NUTRITION DIAGNOSIS:   Severe Malnutrition related to social / environmental circumstances(older age, pt lives alone) as evidenced by moderate fat depletion, severe fat depletion, moderate muscle depletion, severe muscle depletion.  GOAL:   Patient will meet greater than or equal to 90% of their needs  MONITOR:   PO intake, Supplement acceptance, I & O's, Labs, Weight trends, Skin  REASON FOR ASSESSMENT:   Consult Assessment of nutrition requirement/status  ASSESSMENT:   84 year old female who presented to the ED s/p fall in her driveway with right injured stroke femur fracture. Pt with no significant past medical history. Per intake notes, pt was very independent and lived alone PTA.  6/22 - s/p intramedullary fixation, right femur  Spoke with pt at bedside. Pt difficult to arose and fell asleep several times during the middle of conversation with RD. Pt states that she has a good appetite but is not eating as much at the hospital as she does at home because she is not "sitting up straight." Discussed with RN who reports that pt is transitioned to a sitting position for meals. Encouraged pt to press call bell if she feels she is not in a good position for eating.  It appears that pt has several specific food preferences. Discussed with RN who also noted pt's specific food preferences. RD noted multiple plastic bags of Special K cereal at pt's bedside that pt reports were brought from home by her friend. Per RN, pt ordered an empty bowl and spoon with breakfast tray so that she could eat her cereal from home. RD noted list of pt's food preferences on bathroom door that appeared to be written by her friend: skim milk, non-fat milk,  cereal, strawberries, blueberries, and pineapple. Pt stats, "they brought me whole milk, and I don't tolerate that."  Pt agreeable to receiving Ensure Enlive during current hospitalization to maximize protein and calorie intake as meal completion is 10-25%. RD noted unfinished breakfast meal tray in pt's room at time of visit with approximately 25-50% meal completion.  Pt reports typically eating 3 meals daily. Per chart review, pt lives alone. Breakfast might include whole wheat toast, fruit, and cereal with milk. Lunch may include "a sandwich with some sort of protein."  Concern that pt is not able to meet her nutritional needs in her current living situation given severe protein-calorie malnutrition and diet recall.  Pt denies losing weight. Unfortunately, no weight history in pt's chart. Unsure if pt's current weight is her baseline.  Medications reviewed and include: 100 mg Colace BID  Labs reviewed: BUN 26 (H), RBC 2.44 (L), hemoglobin 7.8 (L), HCT 23.4 (L)  UOP: 1170 ml x 24 hours  NUTRITION - FOCUSED PHYSICAL EXAM:    Most Recent Value  Orbital Region  Severe depletion  Upper Arm Region  Moderate depletion  Thoracic and Lumbar Region  Severe depletion  Buccal Region  Moderate depletion  Temple Region  Severe depletion  Clavicle Bone Region  Severe depletion  Clavicle and Acromion Bone Region  Severe depletion  Scapular Bone Region  Unable to assess  Dorsal Hand  Moderate depletion  Patellar Region  Moderate depletion  Anterior Thigh Region  Moderate depletion  Posterior Calf Region  Moderate depletion  Edema (RD Assessment)  None  Hair  Reviewed  Eyes  Reviewed  Mouth  Reviewed  Skin  Reviewed  Nails  Reviewed       Diet Order:   Diet Order           Diet regular Room service appropriate? Yes; Fluid consistency: Thin  Diet effective now          EDUCATION NEEDS:   Not appropriate for education at this time  Skin:  Skin Assessment: Skin Integrity  Issues: Skin Integrity Issues:: Incisions Incisions: closed surgical incision to R hip  Last BM:  06/24/17  Height:   Ht Readings from Last 1 Encounters:  06/25/17 5\' 4"  (1.626 m)    Weight:   Wt Readings from Last 1 Encounters:  06/25/17 110 lb (49.9 kg)    Ideal Body Weight:  54.5 kg  BMI:  Body mass index is 18.88 kg/m.  Estimated Nutritional Needs:   Kcal:  1400-1600 kcal/day  Protein:  70-85 grams/day  Fluid:  >/= 1.4 L/day    Christine Jennings Christine Dysert, MS, RD, LDN Pager: [phone] Weekend/After Hours: [phone]

## 2017-06-27 NOTE — Progress Notes (Addendum)
Physical Therapy Treatment Patient Details Name: Christine Jennings MRN: [id number] DOB: Dec 22, 1927 Today's Date: 06/27/2017    History of Present Illness 84 y.o. female s/p R femur intramedullary fixation after a fall in her driveway causinga  R intertrochanteric femur fx.     PT Comments    Assisted patient to EOB and transfer to chair. Patient needed max assist with all activities and experienced extreme dizziness due to orthostatic hypotension. Placed patient in recliner with feet elevated and notified nursing.      Follow Up Recommendations  SNF     Equipment Recommendations       Recommendations for Other Services       Precautions / Restrictions Precautions Precautions: Fall Restrictions Weight Bearing Restrictions: No RLE Weight Bearing: Weight bearing as tolerated    Mobility  Bed Mobility Overal bed mobility: Needs Assistance Bed Mobility: Supine to Sit;Sit to Supine     Supine to sit: HOB elevated;Max assist;+2 for physical assistance     General bed mobility comments: gradual segmental movement of LE's; bed pad used to scoot hips; assist needed with upper body; sat on EOB with slight dizziness while vitals were taken; symptoms gradually improved.  Transfers Overall transfer level: Needs assistance Equipment used: Rolling walker (2 wheeled) Transfers: Sit to/from Stand Sit to Stand: +2 physical assistance;Max assist         General transfer comment: VC's needed for hand placement and body position relative to walker  Ambulation/Gait             General Gait Details: Unable to perform gait due to orthostatic hypotension with a BP of 50/26; pt. max complaints of dizziness and quickly assisted to recliner; RN called to room   Stairs             Wheelchair Mobility    Modified Rankin (Stroke Patients Only)       Balance                                            Cognition Arousal/Alertness:  Awake/alert Behavior During Therapy: WFL for tasks assessed/performed Overall Cognitive Status: Within Functional Limits for tasks assessed                                        Exercises      General Comments        Pertinent Vitals/Pain Pain Assessment: Faces Faces Pain Scale: Hurts even more Pain Location: R hip and thigh Pain Descriptors / Indicators: Grimacing;Sore;Guarding;Aching Pain Intervention(s): Limited activity within patient's tolerance;Repositioned;Ice applied;Monitored during session    Home Living                      Prior Function            PT Goals (current goals can now be found in the care plan section) Progress towards PT goals: Progressing toward goals    Frequency    Min 5X/week      PT Plan Current plan remains appropriate    Co-evaluation              AM-PAC PT "6 Clicks" Daily Activity  Outcome Measure  Difficulty turning over in bed (including adjusting bedclothes, sheets and blankets)?: Unable Difficulty moving from lying on back to  sitting on the side of the bed? : Unable Difficulty sitting down on and standing up from a chair with arms (e.g., wheelchair, bedside commode, etc,.)?: Unable Help needed moving to and from a bed to chair (including a wheelchair)?: Total Help needed walking in hospital room?: Total Help needed climbing 3-5 steps with a railing? : Total 6 Click Score: 6    End of Session Equipment Utilized During Treatment: Gait belt Activity Tolerance: Other (comment)(Orthostatic Hypotension) Patient left: in chair;with call bell/phone within reach;with chair alarm set;with nursing/sitter in room Nurse Communication: Mobility status;Other (comment)(Orthostatic Hypotension) PT Visit Diagnosis: Muscle weakness (generalized) (M62.81);Unsteadiness on feet (R26.81)     Time:  - 12:16 - 12:50    Charges:    2 ta                   G Codes:         Randa Lynn, SPTA 06/27/2017,  2:30 PM  direct supervision during session and agree with above PN Felecia Shelling  PTA [facility]  Acute  Rehab Pager      [phone]

## 2017-06-27 NOTE — Progress Notes (Signed)
Patient ID: Christine Jennings, female   DOB: 04-08-1927, 84 y.o.   MRN: [id number]  PROGRESS NOTE    Christine Jennings  WUJ:[id number] DOB: 1927/04/02 DOA: 06/25/2017 PCP: Patient, No Pcp Per   Brief Narrative:  84 year old female with no significant medical history presented on 06/25/2017 with mechanical fall and was found to have right hip fracture.  Orthopedics was consulted.  Patient underwent right hip surgery on 06/25/2017.   Assessment & Plan:   Active Problems:   Hip fracture (HCC)   Closed displaced intertrochanteric fracture of right femur (HCC)   Right intertrochanteric femur fracture after a mechanical fall -Status post right femur intramedullary fixation on 06/25/2017 by orthopedics -Wound care and activity as per orthopedics recommendations.  Incentive spirometry.  Pain management -Continue PT/OT eval.  Social worker following for placement. -Fall precautions  Leukocytosis -Reactive.  Resolved   Anemia with concern for acute blood loss anemia secondary to fracture and hip surgery -Monitor hemoglobin.  Hemoglobin 7.8 today.  Transfuse if hemoglobin is less than 7 or there is an active evidence of bleeding.  Thrombocytopenia -Questionable cause.  Monitor.  DVT prophylaxis: Lovenox Code Status: Full Family Communication: None at bedside Disposition Plan: Probable rehab once bed is available  Consultants: Orthopedics  Procedures: Right femur intramedullary fixation on 06/25/2017  Antimicrobials: None   Subjective: Patient seen and examined at bedside.  She still complains of right hip pain.  No overnight fever, nausea, vomiting. Objective: Vitals:   06/26/17 1358 06/26/17 1613 06/26/17 2121 06/27/17 0604  BP: (!) 98/56  (!) 120/49 115/61  Pulse: 75  83 94  Resp: 16  15 15   Temp: 98.1 F (36.7 C)  98.1 F (36.7 C) 98.5 F (36.9 C)  TempSrc: Oral  Oral Oral  SpO2: 100% 100% 96% 98%  Weight:      Height:        Intake/Output Summary (Last 24 hours)  at 06/27/2017 0953 Last data filed at 06/27/2017 0611 Gross per 24 hour  Intake 605 ml  Output 1170 ml  Net -565 ml   Filed Weights   06/25/17 1616  Weight: 49.9 kg (110 lb)    Examination:  General exam: Appears calm and comfortable.  Elderly female lying in bed.  No acute distress Respiratory system: Bilateral decreased breath sound at bases, no wheezing Cardiovascular system: S1 & S2 heard, rate controlled currently  gastrointestinal system: Abdomen is nondistended, soft and nontender. Normal bowel sounds heard. Extremities: No cyanosis,  edema    Data Reviewed: I have personally reviewed following labs and imaging studies  CBC: Recent Labs  Lab 06/25/17 1633 06/26/17 0508 06/27/17 0401  WBC 14.8* 8.6 8.5  NEUTROABS 13.4*  --   --   HGB 11.6* 8.5* 7.8*  HCT 35.0* 25.4* 23.4*  MCV 93.8 94.1 95.9  PLT 137* 118* 104*   Basic Metabolic Panel: Recent Labs  Lab 06/25/17 1633 06/26/17 0508 06/27/17 0401  NA 138 140 138  K 3.7 4.6 4.4  CL 104 106 106  CO2 26 28 27   GLUCOSE 150* 147* 121*  BUN 30* 26* 26*  CREATININE 0.66 0.78 0.60  CALCIUM 8.9 8.1* 8.2*  MG  --   --  2.1   GFR: Estimated Creatinine Clearance: 38.3 mL/min (by C-G formula based on SCr of 0.6 mg/dL). Liver Function Tests: Recent Labs  Lab 06/25/17 1633  AST 28  ALT 21  ALKPHOS 49  BILITOT 0.9  PROT 6.1*  ALBUMIN 3.8   No results for input(s):  LIPASE, AMYLASE in the last 168 hours. No results for input(s): AMMONIA in the last 168 hours. Coagulation Profile: No results for input(s): INR, PROTIME in the last 168 hours. Cardiac Enzymes: No results for input(s): CKTOTAL, CKMB, CKMBINDEX, TROPONINI in the last 168 hours. BNP (last 3 results) No results for input(s): PROBNP in the last 8760 hours. HbA1C: No results for input(s): HGBA1C in the last 72 hours. CBG: No results for input(s): GLUCAP in the last 168 hours. Lipid Profile: No results for input(s): CHOL, HDL, LDLCALC, TRIG, CHOLHDL,  LDLDIRECT in the last 72 hours. Thyroid Function Tests: No results for input(s): TSH, T4TOTAL, FREET4, T3FREE, THYROIDAB in the last 72 hours. Anemia Panel: Recent Labs    06/25/17 1712  FERRITIN 148  TIBC 349  IRON 79   Sepsis Labs: No results for input(s): PROCALCITON, LATICACIDVEN in the last 168 hours.  No results found for this or any previous visit (from the past 240 hour(s)).       Radiology Studies: Dg Chest 1 View  Result Date: 06/25/2017 CLINICAL DATA:  Fall today. EXAM: CHEST  1 VIEW COMPARISON:  None. FINDINGS: The heart size and mediastinal contours are within normal limits. Both lungs are clear. No pneumothorax or pleural effusion is noted. The visualized skeletal structures are unremarkable. IMPRESSION: No acute cardiopulmonary abnormality seen. Electronically Signed   By: Lupita RaiderJames  Green Jr, M.D.   On: 06/25/2017 17:52   Pelvis Portable  Result Date: 06/26/2017 CLINICAL DATA:  Postop EXAM: PORTABLE PELVIS 1-2 VIEWS COMPARISON:  06/25/2017 FINDINGS: Interval intramedullary rod and screw fixation of the right femur for intertrochanteric fracture. Anatomic alignment. Moderate gas in the soft tissues of the right hip. Pubic symphysis and rami are intact IMPRESSION: Interval intramedullary rod and screw fixation of the right femur for intertrochanteric fracture with expected surgical changes Electronically Signed   By: Jasmine PangKim  Fujinaga M.D.   On: 06/26/2017 02:40   Dg Knee Right Port  Result Date: 06/25/2017 CLINICAL DATA:  Right knee pain following a fall today. EXAM: PORTABLE RIGHT KNEE - 1-2 VIEW COMPARISON:  None. FINDINGS: Similar oblique portable AP and lateral views of the right knee demonstrate diffuse osteopenia. No fracture, dislocation or effusion seen. IMPRESSION: No fracture or effusion. Electronically Signed   By: Beckie SaltsSteven  Reid M.D.   On: 06/25/2017 19:03   Dg C-arm 1-60 Min-no Report  Result Date: 06/25/2017 Fluoroscopy was utilized by the requesting physician.   No radiographic interpretation.   Dg Hip Operative Unilat W Or W/o Pelvis Right  Result Date: 06/26/2017 CLINICAL DATA:  Hip fracture EXAM: OPERATIVE right HIP (WITH PELVIS IF PERFORMED) 2 VIEWS TECHNIQUE: Fluoroscopic spot image(s) were submitted for interpretation post-operatively. COMPARISON:  06/25/2017 FINDINGS: Total fluoroscopy was 53 seconds. Two low resolution intraoperative spot views of the right hip. Intramedullary rod and screw fixation of the right femur for intertrochanteric fracture. IMPRESSION: Intraoperative fluoroscopic assistance provided during right hip surgery Electronically Signed   By: Jasmine PangKim  Fujinaga M.D.   On: 06/26/2017 02:39   Dg Hip Unilat W Or Wo Pelvis 2-3 Views Right  Result Date: 06/25/2017 CLINICAL DATA:  Right hip pain after fall. EXAM: DG HIP (WITH OR WITHOUT PELVIS) 2-3V RIGHT COMPARISON:  None. FINDINGS: Moderately angulated fracture is seen involving the intertrochanteric region of the proximal right femur. Femoral head is situated within the acetabulum. IMPRESSION: Moderately angulated intertrochanteric fracture of the proximal right femur. Electronically Signed   By: Lupita RaiderJames  Green Jr, M.D.   On: 06/25/2017 17:53  Scheduled Meds: . docusate sodium  100 mg Oral BID  . enoxaparin (LOVENOX) injection  40 mg Subcutaneous Q24H   Continuous Infusions: . sodium chloride Stopped (06/27/17 0405)     LOS: 2 days        Glade Lloyd, MD [facility] Pager [phone]  If 7PM-7AM, please contact night-coverage [email or web address] Password [facility] 06/27/2017, 9:53 AM

## 2017-06-28 DIAGNOSIS — E43 Unspecified severe protein-calorie malnutrition: Secondary | ICD-10-CM

## 2017-06-28 LAB — TYPE AND SCREEN
ABO/RH(D): O NEG
Antibody Screen: NEGATIVE
Unit division: 0

## 2017-06-28 LAB — CBC
HEMATOCRIT: 20.9 % — AB (ref 36.0–46.0)
Hemoglobin: 7.2 g/dL — ABNORMAL LOW (ref 12.0–15.0)
MCH: 32.6 pg (ref 26.0–34.0)
MCHC: 34.4 g/dL (ref 30.0–36.0)
MCV: 94.6 fL (ref 78.0–100.0)
Platelets: 105 10*3/uL — ABNORMAL LOW (ref 150–400)
RBC: 2.21 MIL/uL — AB (ref 3.87–5.11)
RDW: 12.7 % (ref 11.5–15.5)
WBC: 7.5 10*3/uL (ref 4.0–10.5)

## 2017-06-28 LAB — BASIC METABOLIC PANEL
ANION GAP: 4 — AB (ref 5–15)
BUN: 19 mg/dL (ref 8–23)
CO2: 29 mmol/L (ref 22–32)
Calcium: 8.2 mg/dL — ABNORMAL LOW (ref 8.9–10.3)
Chloride: 104 mmol/L (ref 98–111)
Creatinine, Ser: 0.53 mg/dL (ref 0.44–1.00)
GFR calc Af Amer: >60 mL/min (ref >60)
GFR calc non Af Amer: >60 mL/min (ref >60)
GLUCOSE: 112 mg/dL — AB (ref 70–99)
POTASSIUM: 4.3 mmol/L (ref 3.5–5.1)
Sodium: 137 mmol/L (ref 135–145)

## 2017-06-28 LAB — PREPARE RBC (CROSSMATCH)

## 2017-06-28 LAB — HEMOGLOBIN AND HEMATOCRIT, BLOOD
HCT: 26.8 % — ABNORMAL LOW (ref 36.0–46.0)
Hemoglobin: 9 g/dL — ABNORMAL LOW (ref 12.0–15.0)

## 2017-06-28 LAB — MAGNESIUM: Magnesium: 2 mg/dL (ref 1.7–2.4)

## 2017-06-28 LAB — BPAM RBC
Blood Product Expiration Date: 201907192359
Unit Type and Rh: 9500

## 2017-06-28 MED ORDER — DOCUSATE SODIUM 100 MG PO CAPS: 100.0000 mg | ORAL_CAPSULE | Freq: Two times a day (BID) | ORAL | 0 refills | Status: DC

## 2017-06-28 MED ORDER — POLYETHYLENE GLYCOL 3350 17 G PO PACK: 17.0000 g | PACK | Freq: Every day | ORAL | 0 refills | Status: AC | PRN

## 2017-06-28 MED ORDER — ADULT MULTIVITAMIN W/MINERALS CH: 1.0000 | ORAL_TABLET | Freq: Every day | ORAL | 0 refills | Status: DC

## 2017-06-28 MED ORDER — ENSURE ENLIVE PO LIQD: 237.0000 mL | Freq: Two times a day (BID) | ORAL | 12 refills | Status: DC

## 2017-06-28 NOTE — Discharge Summary (Addendum)
Physician Discharge Summary  Christine Jennings ZOX:[id number] DOB: 1927/04/13 DOA: 06/25/2017  PCP: Patient, No Pcp Per  Admit date: 06/25/2017 Discharge date: 06/28/2017  Admitted From: Home Disposition: Nursing home  Recommendations for Outpatient Follow-up:  1. Follow up with PCP in 1 week with repeat CBC/BMP 2. Follow-up with orthopedics as an outpatient 3. Activity as per orthopedic/PT recommendations 4. Wound care as per orthopedics recommendations 5. Fall precautions   Home Health: No Equipment/Devices: None  Discharge Condition: Stable CODE STATUS: Full Diet recommendation: Heart Healthy   Brief/Interim Summary: 84 year old female with no significant medical history presented on 06/25/2017 with mechanical fall and was found to have right hip fracture.  Orthopedics was consulted.  Patient underwent right hip surgery on 06/25/2017.  Her hemoglobin is 7.2 today, she was slightly dizzy yesterday, she will be transfused 1 unit of packed red cells today.  She will be discharged to nursing home once bed is available.    Discharge Diagnoses:  Active Problems:   Hip fracture (HCC)   Closed displaced intertrochanteric fracture of right femur (HCC)   Right intertrochanteric femur fracture after a mechanical fall -Status post right femur intramedullary fixation on 06/25/2017 by orthopedics -Wound care and activity as per orthopedics recommendations.  Incentive spirometry.  Pain management -Continue PT/OT and nursing home.  Discharge to nursing home once bed is available. -Fall precautions -Continue Lovenox for DVT prophylaxis as per orthopedics.  Outpatient follow-up with orthopedics  Leukocytosis -Reactive.  Resolved   Anemia with concern for acute blood loss anemia secondary to fracture and hip surgery -Monitor hemoglobin.  Hemoglobin 7.2 today.    Patient was symptomatic with dizziness yesterday.  Will transfuse 1 unit of packed red cells today.  Discharge patient after  transfusion.  Outpatient follow-up of CBC  Thrombocytopenia -Questionable cause.  Monitor.  Severe malnutrition -Follow nutrition recommendations.    Discharge Instructions  Discharge Instructions    Call MD for:  difficulty breathing, headache or visual disturbances   Complete by:  As directed    Call MD for:  extreme fatigue   Complete by:  As directed    Call MD for:  hives   Complete by:  As directed    Call MD for:  persistant dizziness or light-headedness   Complete by:  As directed    Call MD for:  persistant nausea and vomiting   Complete by:  As directed    Call MD for:  redness, tenderness, or signs of infection (pain, swelling, redness, odor or green/yellow discharge around incision site)   Complete by:  As directed    Call MD for:  severe uncontrolled pain   Complete by:  As directed    Call MD for:  temperature >100.4   Complete by:  As directed    Diet - low sodium heart healthy   Complete by:  As directed    Discharge instructions   Complete by:  As directed    Activity as per orthopedic/PT recommendations Wound care as per orthopedics recommendations Fall precautions   Increase activity slowly   Complete by:  As directed      Allergies as of 06/28/2017   No Known Allergies     Medication List    TAKE these medications   docusate sodium 100 MG capsule Commonly known as:  COLACE Take 1 capsule (100 mg total) by mouth 2 (two) times daily.   enoxaparin 40 MG/0.4ML injection Commonly known as:  LOVENOX Inject 0.4 mLs (40 mg total) into the skin  daily.   feeding supplement (ENSURE ENLIVE) Liqd Take 237 mLs by mouth 2 (two) times daily between meals.   HYDROcodone-acetaminophen 5-325 MG tablet Commonly known as:  NORCO/VICODIN Take 1-2 tablets by mouth every 6 (six) hours as needed (prn hip pain).   multivitamin with minerals Tabs tablet Take 1 tablet by mouth daily. Start taking on:  06/29/2017   polyethylene glycol packet Commonly known as:   MIRALAX / GLYCOLAX Take 17 g by mouth daily as needed for mild constipation.   VITAMIN C PO Take 1 tablet by mouth daily.   VITAMIN D PO Take 1 tablet by mouth daily.      Follow-up Information    Swinteck, Arlys John, MD. Schedule an appointment as soon as possible for a visit in 2 weeks.   Specialty:  Orthopedic Surgery Why:  For wound re-check Contact information: [address] [place] [address] [phone]        PCP. Schedule an appointment as soon as possible for a visit in 1 week(s).   Why:  With repeat CBC/BMP         No Known Allergies  Consultations:  Orthopedics   Procedures/Studies: Dg Chest 1 View  Result Date: 06/25/2017 CLINICAL DATA:  Fall today. EXAM: CHEST  1 VIEW COMPARISON:  None. FINDINGS: The heart size and mediastinal contours are within normal limits. Both lungs are clear. No pneumothorax or pleural effusion is noted. The visualized skeletal structures are unremarkable. IMPRESSION: No acute cardiopulmonary abnormality seen. Electronically Signed   By: Lupita Raider, M.D.   On: 06/25/2017 17:52   Pelvis Portable  Result Date: 06/26/2017 CLINICAL DATA:  Postop EXAM: PORTABLE PELVIS 1-2 VIEWS COMPARISON:  06/25/2017 FINDINGS: Interval intramedullary rod and screw fixation of the right femur for intertrochanteric fracture. Anatomic alignment. Moderate gas in the soft tissues of the right hip. Pubic symphysis and rami are intact IMPRESSION: Interval intramedullary rod and screw fixation of the right femur for intertrochanteric fracture with expected surgical changes Electronically Signed   By: Jasmine Pang M.D.   On: 06/26/2017 02:40   Dg Knee Right Port  Result Date: 06/25/2017 CLINICAL DATA:  Right knee pain following a fall today. EXAM: PORTABLE RIGHT KNEE - 1-2 VIEW COMPARISON:  None. FINDINGS: Similar oblique portable AP and lateral views of the right knee demonstrate diffuse osteopenia. No fracture, dislocation or effusion  seen. IMPRESSION: No fracture or effusion. Electronically Signed   By: Beckie Salts M.D.   On: 06/25/2017 19:03   Dg C-arm 1-60 Min-no Report  Result Date: 06/25/2017 Fluoroscopy was utilized by the requesting physician.  No radiographic interpretation.   Dg Hip Operative Unilat W Or W/o Pelvis Right  Result Date: 06/26/2017 CLINICAL DATA:  Hip fracture EXAM: OPERATIVE right HIP (WITH PELVIS IF PERFORMED) 2 VIEWS TECHNIQUE: Fluoroscopic spot image(s) were submitted for interpretation post-operatively. COMPARISON:  06/25/2017 FINDINGS: Total fluoroscopy was 53 seconds. Two low resolution intraoperative spot views of the right hip. Intramedullary rod and screw fixation of the right femur for intertrochanteric fracture. IMPRESSION: Intraoperative fluoroscopic assistance provided during right hip surgery Electronically Signed   By: Jasmine Pang M.D.   On: 06/26/2017 02:39   Dg Hip Unilat W Or Wo Pelvis 2-3 Views Right  Result Date: 06/25/2017 CLINICAL DATA:  Right hip pain after fall. EXAM: DG HIP (WITH OR WITHOUT PELVIS) 2-3V RIGHT COMPARISON:  None. FINDINGS: Moderately angulated fracture is seen involving the intertrochanteric region of the proximal right femur. Femoral head is situated within the acetabulum.  IMPRESSION: Moderately angulated intertrochanteric fracture of the proximal right femur. Electronically Signed   By: Lupita RaiderJames  Green Jr, M.D.   On: 06/25/2017 17:53    Right femur intramedullary fixation on 06/25/2017   Subjective: Patient seen and examined at bedside.  She complains of mild right hip pain.  She was able to sit on chair from bed today with help.  No overnight fever, nausea or vomiting.  No black or bloody stools.  Discharge Exam: Vitals:   06/27/17 2216 06/28/17 0539  BP: 117/61 127/62  Pulse: 91 98  Resp: 15 16  Temp: 97.7 F (36.5 C) 98.4 F (36.9 C)  SpO2: 97% 97%   Vitals:   06/27/17 1218 06/27/17 1314 06/27/17 2216 06/28/17 0539  BP: 115/74 (!) 124/57 117/61  127/62  Pulse: 83 75 91 98  Resp:  15 15 16   Temp:  98.2 F (36.8 C) 97.7 F (36.5 C) 98.4 F (36.9 C)  TempSrc:  Oral Oral Oral  SpO2:  100% 97% 97%  Weight:      Height:        General: Pt is alert, awake, not in acute distress Cardiovascular: Rate controlled, S1/S2 + Respiratory: Bilateral decreased breath sounds at bases Abdominal: Soft, NT, ND, bowel sounds + Extremities: no edema, no cyanosis    The results of significant diagnostics from this hospitalization (including imaging, microbiology, ancillary and laboratory) are listed below for reference.     Microbiology: No results found for this or any previous visit (from the past 240 hour(s)).   Labs: BNP (last 3 results) No results for input(s): BNP in the last 8760 hours. Basic Metabolic Panel: Recent Labs  Lab 06/25/17 1633 06/26/17 0508 06/27/17 0401 06/28/17 0544  NA 138 140 138 137  K 3.7 4.6 4.4 4.3  CL 104 106 106 104  CO2 26 28 27 29   GLUCOSE 150* 147* 121* 112*  BUN 30* 26* 26* 19  CREATININE 0.66 0.78 0.60 0.53  CALCIUM 8.9 8.1* 8.2* 8.2*  MG  --   --  2.1 2.0   Liver Function Tests: Recent Labs  Lab 06/25/17 1633  AST 28  ALT 21  ALKPHOS 49  BILITOT 0.9  PROT 6.1*  ALBUMIN 3.8   No results for input(s): LIPASE, AMYLASE in the last 168 hours. No results for input(s): AMMONIA in the last 168 hours. CBC: Recent Labs  Lab 06/25/17 1633 06/26/17 0508 06/27/17 0401 06/28/17 0544  WBC 14.8* 8.6 8.5 7.5  NEUTROABS 13.4*  --   --   --   HGB 11.6* 8.5* 7.8* 7.2*  HCT 35.0* 25.4* 23.4* 20.9*  MCV 93.8 94.1 95.9 94.6  PLT 137* 118* 104* 105*   Cardiac Enzymes: No results for input(s): CKTOTAL, CKMB, CKMBINDEX, TROPONINI in the last 168 hours. BNP: Invalid input(s): POCBNP CBG: No results for input(s): GLUCAP in the last 168 hours. D-Dimer No results for input(s): DDIMER in the last 72 hours. Hgb A1c No results for input(s): HGBA1C in the last 72 hours. Lipid Profile No results  for input(s): CHOL, HDL, LDLCALC, TRIG, CHOLHDL, LDLDIRECT in the last 72 hours. Thyroid function studies No results for input(s): TSH, T4TOTAL, T3FREE, THYROIDAB in the last 72 hours.  Invalid input(s): FREET3 Anemia work up Recent Labs    06/25/17 1712  FERRITIN 148  TIBC 349  IRON 79   Urinalysis No results found for: COLORURINE, APPEARANCEUR, LABSPEC, PHURINE, GLUCOSEU, HGBUR, BILIRUBINUR, KETONESUR, PROTEINUR, UROBILINOGEN, NITRITE, LEUKOCYTESUR Sepsis Labs Invalid input(s): PROCALCITONIN,  WBC,  LACTICIDVEN Microbiology  No results found for this or any previous visit (from the past 240 hour(s)).   Time coordinating discharge: Over 30 minutes  SIGNED:   Glade Lloyd, MD  [facility] 06/28/2017, 9:25 AM Pager: [phone]  If 7PM-7AM, please contact night-coverage [email or web address] Password [email or web address]

## 2017-06-28 NOTE — Progress Notes (Signed)
Report given to Misty StanleyLisa, Charity fundraiserN at [facility]

## 2017-06-28 NOTE — NC FL2 (Signed)
[place] MEDICAID FL2 LEVEL OF CARE SCREENING TOOL     IDENTIFICATION  Patient Name: Christine Smothersileen Mae Jennings Birthdate: [birth year] Sex: female Admission Date (Current Location): 06/25/2017  [facility] [place]   Facility and Address:  [facility],  [address], [place] [id number]      Provider Number: [id number]  Attending Physician Name and Address:  Glade LloydAlekh, Kshitiz, MD  Relative Name and Phone Number:       Current Level of Care: Hospital Recommended Level of Care: Skilled Nursing Facility Prior Approval Number:    Date Approved/Denied:   PASRR Number: [id number]  Discharge Plan: SNF    Current Diagnoses: Patient Active Problem List   Diagnosis Date Noted  . Hip fracture (HCC) 06/25/2017  . Closed displaced intertrochanteric fracture of right femur (HCC) 06/25/2017    Orientation RESPIRATION BLADDER Height & Weight     Self, Time, Situation, Place  Normal Continent Weight: 110 lb (49.9 kg) Height:  5\' 4"  (162.6 cm)  BEHAVIORAL SYMPTOMS/MOOD NEUROLOGICAL BOWEL NUTRITION STATUS      Continent Diet(Regular)  AMBULATORY STATUS COMMUNICATION OF NEEDS Skin   Extensive Assist Verbally Surgical wounds                       Personal Care Assistance Level of Assistance  Bathing, Feeding, Dressing Bathing Assistance: Limited assistance Feeding assistance: Independent Dressing Assistance: Limited assistance     Functional Limitations Info  Sight, Hearing, Speech Sight Info: Adequate Hearing Info: Adequate Speech Info: Adequate    SPECIAL CARE FACTORS FREQUENCY  PT (By licensed PT), OT (By licensed OT)     PT Frequency: 5x/week OT Frequency: 5x/week            Contractures Contractures Info: Not present    Additional Factors Info  Code Status, Allergies Code Status Info: Fullcode Allergies Info: Allergies: No Known Allergies           Current Medications (06/28/2017):  This is the current hospital active  medication list Current Facility-Administered Medications  Medication Dose Route Frequency Provider Last Rate Last Dose  . 0.9 %  sodium chloride infusion   Intravenous Continuous PRN Samson FredericSwinteck, Brian, MD   Stopped at 06/27/17 0405  . acetaminophen (TYLENOL) tablet 325-650 mg  325-650 mg Oral Q6H PRN Samson FredericSwinteck, Brian, MD   650 mg at 06/28/17 0547  . docusate sodium (COLACE) capsule 100 mg  100 mg Oral BID Samson FredericSwinteck, Brian, MD   100 mg at 06/27/17 2105  . enoxaparin (LOVENOX) injection 40 mg  40 mg Subcutaneous Q24H Samson FredericSwinteck, Brian, MD   40 mg at 06/27/17 0904  . feeding supplement (ENSURE ENLIVE) (ENSURE ENLIVE) liquid 237 mL  237 mL Oral BID BM Hanley BenAlekh, Kshitiz, MD   237 mL at 06/28/17 0849  . HYDROcodone-acetaminophen (NORCO/VICODIN) 5-325 MG per tablet 1-2 tablet  1-2 tablet Oral Q6H PRN Samson FredericSwinteck, Brian, MD   1 tablet at 06/26/17 0222  . menthol-cetylpyridinium (CEPACOL) lozenge 3 mg  1 lozenge Oral PRN Swinteck, Arlys JohnBrian, MD       Or  . phenol (CHLORASEPTIC) mouth spray 1 spray  1 spray Mouth/Throat PRN Swinteck, Arlys JohnBrian, MD      . metoCLOPramide (REGLAN) tablet 5-10 mg  5-10 mg Oral Q8H PRN Swinteck, Arlys JohnBrian, MD       Or  . metoCLOPramide (REGLAN) injection 5-10 mg  5-10 mg Intravenous Q8H PRN Swinteck, Arlys JohnBrian, MD      . morphine 2 MG/ML injection 0.5 mg  0.5  mg Intravenous Q2H PRN Swinteck, Arlys John, MD      . multivitamin with minerals tablet 1 tablet  1 tablet Oral Daily Glade Lloyd, MD   1 tablet at 06/27/17 1404  . ondansetron (ZOFRAN) tablet 4 mg  4 mg Oral Q6H PRN Swinteck, Arlys John, MD       Or  . ondansetron (ZOFRAN) injection 4 mg  4 mg Intravenous Q6H PRN Swinteck, Arlys John, MD      . polyethylene glycol (MIRALAX / GLYCOLAX) packet 17 g  17 g Oral Daily PRN Samson Frederic, MD         Discharge Medications: Please see discharge summary for a list of discharge medications.  Relevant Imaging Results:  Relevant Lab Results:   Additional Information [phone]  Christine Coots,  LCSW

## 2017-06-28 NOTE — Care Management Important Message (Signed)
Important Message  Patient Details  Name: Christine Jennings MRN: [id number] Date of Birth: 1927-02-20   Medicare Important Message Given:  Yes    Caren MacadamFuller, Nancylee Gaines 06/28/2017, 11:06 AMImportant Message  Patient Details  Name: Christine Jennings MRN: [id number] Date of Birth: 1927-02-20   Medicare Important Message Given:  Yes    Caren MacadamFuller, Teonna Coonan 06/28/2017, 11:06 AM

## 2017-06-28 NOTE — Clinical Social Work Placement (Addendum)
Attending Surgeon recommends [facility], Unfortunately, there is no bed availability. The patient is agreeable to [name] for [facility].  Patient will receive a unit of blood before discharge/ Facility liaison informed. She reports the patient will need to be in the facility before 6:00pm CSW arranged for [facility] to pick up patient at 5:00pm.   Patient minister Reine Justaul Davis will complete paperwork on her behalf at 4:00pm    CLINICAL SOCIAL WORK PLACEMENT  NOTE  Date:  06/28/2017  Patient Details  Name: Christine Smothersileen Mae Jennings MRN: [id number] Date of Birth: 17-Mar-1927  Clinical Social Work is seeking post-discharge placement for this patient at the Skilled  Nursing Facility level of care (*CSW will initial, date and re-position this form in  chart as items are completed):  Yes   Patient/family provided with [place] Clinical Social Work Department's list of facilities offering this level of care within the geographic area requested by the patient (or if unable, by the patient's family).  Yes   Patient/family informed of their freedom to choose among providers that offer the needed level of care, that participate in Medicare, Medicaid or managed care program needed by the patient, have an available bed and are willing to accept the patient.  Yes   Patient/family informed of [facility] ownership interest in [facility] and [facility], as well as of the fact that they are under no obligation to receive care at these facilities.  PASRR submitted to EDS on 06/28/17     PASRR number received on 06/28/17     Existing PASRR number confirmed on       FL2 transmitted to all facilities in geographic area requested by pt/family on 06/26/17     FL2 transmitted to all facilities within larger geographic area on 06/26/17     Patient informed that his/her managed care company has contracts with or will negotiate with certain facilities, including the following:  [name]     Yes    Patient/family informed of bed offers received.  [name]     Physician [facility] and [name]   (no bed availability at this time) [name] on 06/28/17.  Patient to be transferred to facility by [facility]     Patient family notified on 06/28/17 of transfer.  Name of family member notified:  Hilliard ClarkFriend-Linda, Minister-Davis [phone], Friend-Wendy      PHYSICIAN Please prepare priority discharge summary, including medications     Additional Comment:    _______________________________________________ Clearance CootsNicole A Abagail Limb, LCSW 06/28/2017, 9:16 AM

## 2017-06-28 NOTE — Progress Notes (Signed)
Occupational Therapy Treatment Patient Details Name: Christine Jennings MRN: [id number] DOB: November 11, 1927 Today's Date: 06/28/2017    History of present illness 84 y.o. female s/p R femur intramedullary fixation after a fall in her driveway causinga  R intertrochanteric femur fx.    OT comments  Pt with improvements in mobility today; able to perform bed mobility with mod assist and stand pivot transfer with min assist. No c/o dizziness during session but pt with difficulty weight bearing on RLE to take a step with L foot--able to scoot L foot for pivot transfer. D/c plan remains appropriate. Will continue to follow acutely.    Follow Up Recommendations  SNF;Supervision/Assistance - 24 hour    Equipment Recommendations  Other (comment)(TBD at next venue)    Recommendations for Other Services      Precautions / Restrictions Precautions Precautions: Fall Restrictions Weight Bearing Restrictions: Yes RLE Weight Bearing: Weight bearing as tolerated       Mobility Bed Mobility Overal bed mobility: Needs Assistance Bed Mobility: Supine to Sit     Supine to sit: Mod assist;HOB elevated     General bed mobility comments: Assist for RLE to EOB and light assist for trunk elevation from supine to sitting. Increased time with verbal cues for technique. HOB elevated, no use of bed rails.  Transfers Overall transfer level: Needs assistance Equipment used: Rolling walker (2 wheeled) Transfers: Sit to/from [employer] Sit to Stand: Min assist Stand pivot transfers: Min assist       General transfer comment: Min assist to boost up from EOB and for balance in standing. Cues for hand placement and technique. Pt with difficulty weight shifting to R side and unable to lift L foot off ground to take a step. Pt able to scoot L foot for pivot with min assist for standing balance    Balance Overall balance assessment: Needs assistance Sitting-balance support: Feet  supported Sitting balance-Leahy Scale: Good     Standing balance support: Bilateral upper extremity supported Standing balance-Leahy Scale: Poor Standing balance comment: RW for support throughout                           ADL either performed or assessed with clinical judgement   ADL Overall ADL's : Needs assistance/impaired     Grooming: Set up;Sitting;Wash/dry face               Lower Body Dressing: Maximal assistance Lower Body Dressing Details (indicate cue type and reason): to adjust socks in sitting Toilet Transfer: Minimal assistance;Stand-pivot;RW Toilet Transfer Details (indicate cue type and reason): Simulated by stand pivot EOB to chair, increased time and effort, cues for sequencing. Pt with difficulty stepping with L leg and weight bearing on R         Functional mobility during ADLs: Minimal assistance;Rolling walker(for stand pivot only) General ADL Comments: No c/o dizziness this session.     Vision       Perception     Praxis      Cognition Arousal/Alertness: Awake/alert Behavior During Therapy: WFL for tasks assessed/performed Overall Cognitive Status: Within Functional Limits for tasks assessed                                          Exercises     Shoulder Instructions       General Comments  Pertinent Vitals/ Pain       Pain Assessment: Faces Faces Pain Scale: Hurts little more Pain Location: R hip with movement Pain Descriptors / Indicators: Discomfort;Grimacing Pain Intervention(s): Monitored during session;Limited activity within patient's tolerance;Repositioned;Ice applied  Home Living                                          Prior Functioning/Environment              Frequency  Min 2X/week        Progress Toward Goals  OT Goals(current goals can now be found in the care plan section)  Progress towards OT goals: Progressing toward goals  Acute Rehab OT  Goals Patient Stated Goal: TO get back to being active and getting out  OT Goal Formulation: With patient  Plan Discharge plan remains appropriate    Co-evaluation                 AM-PAC PT "6 Clicks" Daily Activity     Outcome Measure   Help from another person eating meals?: None Help from another person taking care of personal grooming?: A Little Help from another person toileting, which includes using toliet, bedpan, or urinal?: A Lot Help from another person bathing (including washing, rinsing, drying)?: A Lot Help from another person to put on and taking off regular upper body clothing?: None Help from another person to put on and taking off regular lower body clothing?: A Lot 6 Click Score: 17    End of Session Equipment Utilized During Treatment: Gait belt;Rolling walker  OT Visit Diagnosis: Unsteadiness on feet (R26.81);Other abnormalities of gait and mobility (R26.89);Muscle weakness (generalized) (M62.81);Dizziness and giddiness (R42)   Activity Tolerance Patient tolerated treatment well   Patient Left in chair;with call bell/phone within reach;with chair alarm set   Nurse Communication Mobility status;Other (comment)(removed purewick)        Time: 1610-9604: 0831-0850 OT Time Calculation (min): 19 min  Charges: OT General Charges $OT Visit: 1 Visit OT Treatments $Self Care/Home Management : 8-22 mins  Nyelah Emmerich A. Brett Albinooffey, M.S., OTR/L Acute Rehab Department: [phone]   Gaye AlkenBailey A Delrick Dehart 06/28/2017, 8:56 AM

## 2017-06-29 LAB — BPAM RBC
Blood Product Expiration Date: 201907192359
ISSUE DATE / TIME: 201906251131
UNIT TYPE AND RH: 9500

## 2017-06-29 LAB — TYPE AND SCREEN
ABO/RH(D): O NEG
ANTIBODY SCREEN: NEGATIVE
Unit division: 0

## 2017-09-01 ENCOUNTER — Encounter: Payer: Self-pay | Admitting: Podiatry

## 2017-09-01 ENCOUNTER — Ambulatory Visit (INDEPENDENT_AMBULATORY_CARE_PROVIDER_SITE_OTHER): Payer: Medicare Other | Admitting: Podiatry

## 2017-09-01 VITALS — BP 148/72 | HR 70

## 2017-09-01 DIAGNOSIS — M79609 Pain in unspecified limb: Secondary | ICD-10-CM

## 2017-09-01 DIAGNOSIS — M79676 Pain in unspecified toe(s): Secondary | ICD-10-CM

## 2017-09-01 DIAGNOSIS — B351 Tinea unguium: Secondary | ICD-10-CM | POA: Diagnosis not present

## 2017-09-01 DIAGNOSIS — R600 Localized edema: Secondary | ICD-10-CM

## 2017-09-01 MED ORDER — KETOCONAZOLE 2 % EX CREA: TOPICAL_CREAM | CUTANEOUS | 0 refills | Status: DC

## 2017-09-01 NOTE — Progress Notes (Signed)
  Subjective:  Patient ID: Christine Jennings, female    DOB: 1927/04/04,  MRN: [id number]  Chief Complaint  Patient presents with  . Nail Problem    bilateral 1st and 2nd toenails very thick and discolored    84 y.o. female presents with the above complaint. Reports thickened toenails that she cannot care for herself. States the nails cause her pain. Worsened since she had surgery on her leg. Also complains of swelling to both legs but greater on right. On baby aspirin but no other anti-coagulants. Denies other pedal issues.  Review of Systems: Negative except as noted in the HPI. Denies N/V/F/Ch.  History reviewed. No pertinent past medical history.  Current Outpatient Medications:  .  Ascorbic Acid (VITAMIN C PO), Take 1 tablet by mouth daily., Disp: , Rfl:  .  Cholecalciferol (VITAMIN D PO), Take 1 tablet by mouth daily., Disp: , Rfl:  .  docusate sodium (COLACE) 100 MG capsule, Take 1 capsule (100 mg total) by mouth 2 (two) times daily., Disp: 20 capsule, Rfl: 0 .  enoxaparin (LOVENOX) 40 MG/0.4ML injection, Inject 0.4 mLs (40 mg total) into the skin daily., Disp: 30 Syringe, Rfl: 0 .  feeding supplement, ENSURE ENLIVE, (ENSURE ENLIVE) LIQD, Take 237 mLs by mouth 2 (two) times daily between meals., Disp: 237 mL, Rfl: 12 .  HYDROcodone-acetaminophen (NORCO/VICODIN) 5-325 MG tablet, Take 1-2 tablets by mouth every 6 (six) hours as needed (prn hip pain)., Disp: 30 tablet, Rfl: 0 .  ketoconazole (NIZORAL) 2 % cream, Apply 1 fingertip amount to each foot daily., Disp: 30 g, Rfl: 0 .  Multiple Vitamin (MULTIVITAMIN WITH MINERALS) TABS tablet, Take 1 tablet by mouth daily., Disp: 30 tablet, Rfl: 0 .  polyethylene glycol (MIRALAX / GLYCOLAX) packet, Take 17 g by mouth daily as needed for mild constipation., Disp: 14 each, Rfl: 0  Social History   Tobacco Use  Smoking Status Never Smoker  Smokeless Tobacco Never Used    No Known Allergies Objective:   Vitals:   09/01/17 1103  BP: (!)  148/72  Pulse: 70   There is no height or weight on file to calculate BMI. Constitutional Well developed. Well nourished.  Vascular Dorsalis pedis pulses palpable bilaterally. Posterior tibial pulses palpable bilaterally. Capillary refill normal to all digits.  No cyanosis or clubbing noted. Pedal hair growth normal. Edema both lower extremities.  Neurologic Normal speech. Oriented to person, place, and time. Epicritic sensation to light touch grossly present bilaterally.  Dermatologic Nails elongated, thickened, dystrophic, painful, yellow discoloration. No open wounds. No skin lesions.  Orthopedic: Normal joint ROM without pain or crepitus bilaterally. No visible deformities. No bony tenderness.   Radiographs: None Assessment:   1. Pain due to onychomycosis of nail   2. Pain around toenail   3. Localized edema    Plan:  Patient was evaluated and treated and all questions answered.  Onychomycosis with Pain -Nails debrided secondary to pain. -Educated on self-care -Rx Ketoconazole to be applied daily. -Advised she does not meet criteria for routine care.  Localized Edema -Continue compression stockings.  Return if symptoms worsen or fail to improve.

## 2017-09-06 ENCOUNTER — Other Ambulatory Visit: Payer: Self-pay | Admitting: Family Medicine

## 2017-09-06 DIAGNOSIS — E2839 Other primary ovarian failure: Secondary | ICD-10-CM

## 2017-11-01 ENCOUNTER — Ambulatory Visit
Admission: RE | Admit: 2017-11-01 | Discharge: 2017-11-01 | Disposition: A | Discharge status: Home / Self Care | Payer: Medicare Other | Source: Ambulatory Visit | Attending: Family Medicine | Admitting: Family Medicine

## 2017-11-01 DIAGNOSIS — E2839 Other primary ovarian failure: Secondary | ICD-10-CM

## 2018-01-09 ENCOUNTER — Other Ambulatory Visit (HOSPITAL_COMMUNITY): Payer: Self-pay | Admitting: Family Medicine

## 2018-01-09 ENCOUNTER — Ambulatory Visit (HOSPITAL_COMMUNITY)
Admission: RE | Admit: 2018-01-09 | Discharge: 2018-01-09 | Disposition: A | Discharge status: Home / Self Care | Payer: Medicare Other | Source: Ambulatory Visit | Attending: Cardiovascular Disease | Admitting: Cardiovascular Disease

## 2018-01-09 DIAGNOSIS — M7989 Other specified soft tissue disorders: Principal | ICD-10-CM

## 2018-01-09 DIAGNOSIS — M79662 Pain in left lower leg: Secondary | ICD-10-CM

## 2018-01-24 ENCOUNTER — Other Ambulatory Visit: Payer: Self-pay | Admitting: Family Medicine

## 2018-01-24 ENCOUNTER — Other Ambulatory Visit (HOSPITAL_COMMUNITY): Payer: Self-pay | Admitting: Family Medicine

## 2018-01-24 DIAGNOSIS — R600 Localized edema: Secondary | ICD-10-CM

## 2018-01-27 ENCOUNTER — Ambulatory Visit (HOSPITAL_COMMUNITY): Payer: Medicare Other | Attending: Cardiovascular Disease

## 2018-01-27 ENCOUNTER — Other Ambulatory Visit: Payer: Self-pay

## 2018-01-27 DIAGNOSIS — R600 Localized edema: Secondary | ICD-10-CM | POA: Diagnosis present

## 2018-06-29 ENCOUNTER — Inpatient Hospital Stay (HOSPITAL_COMMUNITY)
Admission: EM | Admit: 2018-06-29 | Discharge: 2018-07-04 | DRG: 481 | Disposition: A | Discharge status: Skilled Nursing Facility | Payer: Medicare Other | Attending: Internal Medicine | Admitting: Internal Medicine

## 2018-06-29 ENCOUNTER — Emergency Department (HOSPITAL_COMMUNITY): Payer: Medicare Other

## 2018-06-29 ENCOUNTER — Other Ambulatory Visit: Payer: Self-pay

## 2018-06-29 ENCOUNTER — Encounter (HOSPITAL_COMMUNITY): Payer: Self-pay

## 2018-06-29 DIAGNOSIS — S72145A Nondisplaced intertrochanteric fracture of left femur, initial encounter for closed fracture: Secondary | ICD-10-CM

## 2018-06-29 DIAGNOSIS — Z8781 Personal history of (healed) traumatic fracture: Secondary | ICD-10-CM | POA: Diagnosis not present

## 2018-06-29 DIAGNOSIS — Z8249 Family history of ischemic heart disease and other diseases of the circulatory system: Secondary | ICD-10-CM

## 2018-06-29 DIAGNOSIS — Z79891 Long term (current) use of opiate analgesic: Secondary | ICD-10-CM

## 2018-06-29 DIAGNOSIS — Y92002 Bathroom of unspecified non-institutional (private) residence single-family (private) house as the place of occurrence of the external cause: Secondary | ICD-10-CM

## 2018-06-29 DIAGNOSIS — R739 Hyperglycemia, unspecified: Secondary | ICD-10-CM | POA: Diagnosis present

## 2018-06-29 DIAGNOSIS — F05 Delirium due to known physiological condition: Secondary | ICD-10-CM | POA: Diagnosis not present

## 2018-06-29 DIAGNOSIS — R0902 Hypoxemia: Secondary | ICD-10-CM | POA: Diagnosis present

## 2018-06-29 DIAGNOSIS — T40605A Adverse effect of unspecified narcotics, initial encounter: Secondary | ICD-10-CM | POA: Diagnosis not present

## 2018-06-29 DIAGNOSIS — S72142D Displaced intertrochanteric fracture of left femur, subsequent encounter for closed fracture with routine healing: Secondary | ICD-10-CM | POA: Diagnosis not present

## 2018-06-29 DIAGNOSIS — Z1159 Encounter for screening for other viral diseases: Secondary | ICD-10-CM | POA: Diagnosis not present

## 2018-06-29 DIAGNOSIS — S72142A Displaced intertrochanteric fracture of left femur, initial encounter for closed fracture: Secondary | ICD-10-CM | POA: Diagnosis present

## 2018-06-29 DIAGNOSIS — H9190 Unspecified hearing loss, unspecified ear: Secondary | ICD-10-CM | POA: Diagnosis present

## 2018-06-29 DIAGNOSIS — J439 Emphysema, unspecified: Secondary | ICD-10-CM | POA: Diagnosis present

## 2018-06-29 DIAGNOSIS — W010XXA Fall on same level from slipping, tripping and stumbling without subsequent striking against object, initial encounter: Secondary | ICD-10-CM | POA: Diagnosis present

## 2018-06-29 DIAGNOSIS — W19XXXA Unspecified fall, initial encounter: Secondary | ICD-10-CM

## 2018-06-29 DIAGNOSIS — D649 Anemia, unspecified: Secondary | ICD-10-CM | POA: Diagnosis not present

## 2018-06-29 DIAGNOSIS — Z419 Encounter for procedure for purposes other than remedying health state, unspecified: Secondary | ICD-10-CM

## 2018-06-29 DIAGNOSIS — Z79899 Other long term (current) drug therapy: Secondary | ICD-10-CM

## 2018-06-29 DIAGNOSIS — D638 Anemia in other chronic diseases classified elsewhere: Secondary | ICD-10-CM | POA: Diagnosis present

## 2018-06-29 DIAGNOSIS — M810 Age-related osteoporosis without current pathological fracture: Secondary | ICD-10-CM | POA: Diagnosis present

## 2018-06-29 DIAGNOSIS — D62 Acute posthemorrhagic anemia: Secondary | ICD-10-CM | POA: Diagnosis not present

## 2018-06-29 DIAGNOSIS — Y92239 Unspecified place in hospital as the place of occurrence of the external cause: Secondary | ICD-10-CM | POA: Diagnosis not present

## 2018-06-29 DIAGNOSIS — Z7901 Long term (current) use of anticoagulants: Secondary | ICD-10-CM | POA: Diagnosis not present

## 2018-06-29 DIAGNOSIS — S72002A Fracture of unspecified part of neck of left femur, initial encounter for closed fracture: Secondary | ICD-10-CM

## 2018-06-29 HISTORY — DX: Age-related osteoporosis without current pathological fracture: M81.0

## 2018-06-29 LAB — COMPREHENSIVE METABOLIC PANEL
ALT: 24 U/L (ref 0–44)
AST: 24 U/L (ref 15–41)
Albumin: 3.5 g/dL (ref 3.5–5.0)
Alkaline Phosphatase: 50 U/L (ref 38–126)
Anion gap: 6 (ref 5–15)
BUN: 28 mg/dL — ABNORMAL HIGH (ref 8–23)
CO2: 29 mmol/L (ref 22–32)
Calcium: 9.1 mg/dL (ref 8.9–10.3)
Chloride: 105 mmol/L (ref 98–111)
Creatinine, Ser: 0.66 mg/dL (ref 0.44–1.00)
GFR calc Af Amer: >60 mL/min (ref >60)
GFR calc non Af Amer: >60 mL/min (ref >60)
Glucose, Bld: 170 mg/dL — ABNORMAL HIGH (ref 70–99)
Potassium: 4.5 mmol/L (ref 3.5–5.1)
Sodium: 140 mmol/L (ref 135–145)
Total Bilirubin: 0.6 mg/dL (ref 0.3–1.2)
Total Protein: 5.8 g/dL — ABNORMAL LOW (ref 6.5–8.1)

## 2018-06-29 LAB — CBC WITH DIFFERENTIAL/PLATELET
Abs Immature Granulocytes: 0.16 10*3/uL — ABNORMAL HIGH (ref 0.00–0.07)
Basophils Absolute: 0 10*3/uL (ref 0.0–0.1)
Basophils Relative: 0 %
Eosinophils Absolute: 0.1 10*3/uL (ref 0.0–0.5)
Eosinophils Relative: 0 %
HCT: 35.7 % — ABNORMAL LOW (ref 36.0–46.0)
Hemoglobin: 11.6 g/dL — ABNORMAL LOW (ref 12.0–15.0)
Immature Granulocytes: 1 %
Lymphocytes Relative: 6 %
Lymphs Abs: 1.2 10*3/uL (ref 0.7–4.0)
MCH: 31.8 pg (ref 26.0–34.0)
MCHC: 32.5 g/dL (ref 30.0–36.0)
MCV: 97.8 fL (ref 80.0–100.0)
Monocytes Absolute: 0.9 10*3/uL (ref 0.1–1.0)
Monocytes Relative: 5 %
Neutro Abs: 16.4 10*3/uL — ABNORMAL HIGH (ref 1.7–7.7)
Neutrophils Relative %: 88 %
Platelets: 163 10*3/uL (ref 150–400)
RBC: 3.65 MIL/uL — ABNORMAL LOW (ref 3.87–5.11)
RDW: 12.1 % (ref 11.5–15.5)
WBC: 18.8 10*3/uL — ABNORMAL HIGH (ref 4.0–10.5)
nRBC: 0 % (ref 0.0–0.2)

## 2018-06-29 LAB — SARS CORONAVIRUS 2 BY RT PCR (HOSPITAL ORDER, PERFORMED IN ~~LOC~~ HOSPITAL LAB): SARS Coronavirus 2: NEGATIVE

## 2018-06-29 LAB — PROTIME-INR
INR: 1 (ref 0.8–1.2)
Prothrombin Time: 13.3 seconds (ref 11.4–15.2)

## 2018-06-29 MED ORDER — ACETAMINOPHEN 650 MG RE SUPP: 650.0000 mg | Freq: Four times a day (QID) | RECTAL | Status: DC | PRN

## 2018-06-29 MED ORDER — ENOXAPARIN SODIUM 40 MG/0.4ML ~~LOC~~ SOLN: 40.0000 mg | SUBCUTANEOUS | Status: DC

## 2018-06-29 MED ORDER — HYDROMORPHONE HCL 1 MG/ML IJ SOLN
0.5000 mg | Freq: Once | INTRAMUSCULAR | Status: AC
  Administered 2018-06-29: 0.5 mg via INTRAVENOUS
  Filled 2018-06-29: qty 1

## 2018-06-29 MED ORDER — ONDANSETRON HCL 4 MG/2ML IJ SOLN
4.0000 mg | Freq: Once | INTRAMUSCULAR | Status: AC
  Administered 2018-06-29: 4 mg via INTRAVENOUS
  Filled 2018-06-29: qty 2

## 2018-06-29 MED ORDER — HYDROMORPHONE HCL 1 MG/ML IJ SOLN
0.5000 mg | INTRAMUSCULAR | Status: DC | PRN
  Administered 2018-06-30: 0.5 mg via INTRAVENOUS
  Filled 2018-06-29: qty 1

## 2018-06-29 MED ORDER — FENTANYL CITRATE (PF) 100 MCG/2ML IJ SOLN
25.0000 ug | Freq: Once | INTRAMUSCULAR | Status: AC
  Administered 2018-06-29: 25 ug via INTRAVENOUS
  Filled 2018-06-29: qty 2

## 2018-06-29 MED ORDER — HYDROMORPHONE HCL 1 MG/ML IJ SOLN
0.5000 mg | Freq: Once | INTRAMUSCULAR | Status: AC
  Administered 2018-06-29: 23:00:00 0.5 mg via INTRAVENOUS
  Filled 2018-06-29: qty 1

## 2018-06-29 MED ORDER — ACETAMINOPHEN 325 MG PO TABS: 650.0000 mg | ORAL_TABLET | Freq: Four times a day (QID) | ORAL | Status: DC | PRN

## 2018-06-29 MED ORDER — ONDANSETRON HCL 4 MG/2ML IJ SOLN: 4.0000 mg | Freq: Four times a day (QID) | INTRAMUSCULAR | Status: DC | PRN

## 2018-06-29 MED ORDER — SODIUM CHLORIDE 0.9 % IV SOLN
INTRAVENOUS | Status: DC
  Administered 2018-06-29: 21:00:00 via INTRAVENOUS

## 2018-06-29 MED ORDER — SODIUM CHLORIDE 0.9 % IV SOLN
INTRAVENOUS | Status: DC
  Administered 2018-06-29: 20:00:00 via INTRAVENOUS

## 2018-06-29 NOTE — ED Notes (Signed)
Bed: WA17 Expected date:  Expected time:  Means of arrival:  Comments: Fracture

## 2018-06-29 NOTE — ED Notes (Signed)
Spoke with Care Coordinator for pt, Christine Jennings, she is aware of pt condition and plan of care and would like to be updated as possible.   Christine Jennings [phone]  Son - Jaymi Tinner [phone]

## 2018-06-29 NOTE — ED Triage Notes (Signed)
Patient BIB from home with complaints of fall. Patient reports she was ambulating in the bathroom, lost her footing and fell backwards. Patient complaining of left thigh pain. Patient has shortening and rotation noted in triage. Patient has history of right femur fx. Patient reports 8/10 pain. 9mcg IV Fentanyl administered by EMS en route. 18g Left AC PIV placed by EMS.  EMS VS: 88% RA, 145/70, 69SR, 18RR, 97.28F

## 2018-06-29 NOTE — ED Notes (Signed)
[facility] contacted and paperwork printed  

## 2018-06-29 NOTE — ED Notes (Signed)
[facility] here to transport patient to [facility] 

## 2018-06-29 NOTE — ED Notes (Signed)
Per [facility], patient complaining 9/10 pain-no orders noted for pain meds/Dr. Maudie Mercury paged

## 2018-06-29 NOTE — ED Notes (Signed)
ED TO INPATIENT HANDOFF REPORT  Name/Age/Gender Christine Jennings 85 y.o. female  Code Status    Code Status Orders  (From admission, onward)         Start     Ordered   06/29/18 2110  Full code  Continuous     06/29/18 2115        Code Status History    Date Active Date Inactive Code Status Order ID Comments User Context   06/25/2017 1846 06/28/2017 2103 Full Code [id number]  Ike BenePark, Carolyn J, MD ED   Advance Care Planning Activity      Home/SNF/Other Home  Chief Complaint Fall  Level of Care/Admitting Diagnosis ED Disposition    ED Disposition Condition Comment   Admit  [facility] [[id number]]  Level of Care: Telemetry Medical [104]  Covid Evaluation: Screening Protocol (No Symptoms)  Diagnosis: Intertrochanteric fracture of left hip [facility](HCC) [[id number]]  Admitting Physician: Pearson GrippeKIM, JAMES [phone][[id number]]  Attending Physician: Pearson GrippeKIM, JAMES [phone][[id number]]  Estimated length of stay: past midnight tomorrow  Certification:: I certify this patient will need inpatient services for at least 2 midnights  PT Class (Do Not Modify): Inpatient [101]  PT Acc Code (Do Not Modify): Private [1]       Medical History History reviewed. No pertinent past medical history.  Allergies No Known Allergies  IV Location/Drains/Wounds Patient Lines/Drains/Airways Status   Active Line/Drains/Airways    Name:   Placement date:   Placement time:   Site:   Days:   Peripheral IV 06/29/18 Left Antecubital   06/29/18    -    Antecubital   less than 1   External Urinary Catheter   06/28/17    0835    -   366   Incision (Closed) 06/25/17 Hip Right   06/25/17    2102     369          Labs/Imaging Results for orders placed or performed during the hospital encounter of 06/29/18 (from the past 48 hour(s))  CBC with Differential/Platelet     Status: Abnormal   Collection Time: 06/29/18  7:54 PM  Result Value Ref Range   WBC 18.8 (H) 4.0 - 10.5 K/uL   RBC 3.65 (L) 3.87 - 5.11 MIL/uL    Hemoglobin 11.6 (L) 12.0 - 15.0 g/dL   HCT 47.835.7 (L) 29.536.0 - 62.146.0 %   MCV 97.8 80.0 - 100.0 fL   MCH 31.8 26.0 - 34.0 pg   MCHC 32.5 30.0 - 36.0 g/dL   RDW 30.812.1 65.711.5 - 84.615.5 %   Platelets 163 150 - 400 K/uL   nRBC 0.0 0.0 - 0.2 %   Neutrophils Relative % 88 %   Neutro Abs 16.4 (H) 1.7 - 7.7 K/uL   Lymphocytes Relative 6 %   Lymphs Abs 1.2 0.7 - 4.0 K/uL   Monocytes Relative 5 %   Monocytes Absolute 0.9 0.1 - 1.0 K/uL   Eosinophils Relative 0 %   Eosinophils Absolute 0.1 0.0 - 0.5 K/uL   Basophils Relative 0 %   Basophils Absolute 0.0 0.0 - 0.1 K/uL   Immature Granulocytes 1 %   Abs Immature Granulocytes 0.16 (H) 0.00 - 0.07 K/uL    Comment: Performed at [facility], [address] [place], [place] [address]  Comprehensive metabolic panel     Status: Abnormal   Collection Time: 06/29/18  7:54 PM  Result Value Ref Range   Sodium 140 135 - 145 mmol/L   Potassium  4.5 3.5 - 5.1 mmol/L   Chloride 105 98 - 111 mmol/L   CO2 29 22 - 32 mmol/L   Glucose, Bld 170 (H) 70 - 99 mg/dL   BUN 28 (H) 8 - 23 mg/dL   Creatinine, Ser 6.960.66 0.44 - 1.00 mg/dL   Calcium 9.1 8.9 - 29.510.3 mg/dL   Total Protein 5.8 (L) 6.5 - 8.1 g/dL   Albumin 3.5 3.5 - 5.0 g/dL   AST 24 15 - 41 U/L   ALT 24 0 - 44 U/L   Alkaline Phosphatase 50 38 - 126 U/L   Total Bilirubin 0.6 0.3 - 1.2 mg/dL   GFR calc non Af Amer >60 >60 mL/min   GFR calc Af Amer >60 >60 mL/min   Anion gap 6 5 - 15    Comment: Performed at [facility], [address] [place], [place] [address]  Protime-INR     Status: None   Collection Time: 06/29/18  7:54 PM  Result Value Ref Range   Prothrombin Time 13.3 11.4 - 15.2 seconds   INR 1.0 0.8 - 1.2    Comment: (NOTE) INR goal varies based on device and disease states. Performed at [facility], [address] [place], [place] [address]   SARS Coronavirus 2 (CEPHEID - Performed in [facility] hospital lab), Hosp Order     Status:  None   Collection Time: 06/29/18  8:13 PM   Specimen: Nasopharyngeal Swab  Result Value Ref Range   SARS Coronavirus 2 NEGATIVE NEGATIVE    Comment: (NOTE) If result is NEGATIVE SARS-CoV-2 target nucleic acids are NOT DETECTED. The SARS-CoV-2 RNA is generally detectable in upper and lower  respiratory specimens during the acute phase of infection. The lowest  concentration of SARS-CoV-2 viral copies this assay can detect is 250  copies / mL. A negative result does not preclude SARS-CoV-2 infection  and should not be used as the sole basis for treatment or other  patient management decisions.  A negative result may occur with  improper specimen collection / handling, submission of specimen other  than nasopharyngeal swab, presence of viral mutation(s) within the  areas targeted by this assay, and inadequate number of viral copies  (<250 copies / mL). A negative result must be combined with clinical  observations, patient history, and epidemiological information. If result is POSITIVE SARS-CoV-2 target nucleic acids are DETECTED. The SARS-CoV-2 RNA is generally detectable in upper and lower  respiratory specimens dur ing the acute phase of infection.  Positive  results are indicative of active infection with SARS-CoV-2.  Clinical  correlation with patient history and other diagnostic information is  necessary to determine patient infection status.  Positive results do  not rule out bacterial infection or co-infection with other viruses. If result is PRESUMPTIVE POSTIVE SARS-CoV-2 nucleic acids MAY BE PRESENT.   A presumptive positive result was obtained on the submitted specimen  and confirmed on repeat testing.  While 2019 novel coronavirus  (SARS-CoV-2) nucleic acids may be present in the submitted sample  additional confirmatory testing may be necessary for epidemiological  and / or clinical management purposes  to differentiate between  SARS-CoV-2 and other Sarbecovirus currently  known to infect humans.  If clinically indicated additional testing with an alternate test  methodology [phone]([id number]) is advised. The SARS-CoV-2 RNA is generally  detectable in upper and lower respiratory sp ecimens during the acute  phase of infection. The expected result is Negative. Fact Sheet for Patients:  BoilerBrush.com.cyhttps://www.fda.gov/media/136312/download  Fact Sheet for Healthcare Providers: https://pope.com/https://www.fda.gov/media/136313/download This test is not yet approved or cleared by the [place] FDA and has been authorized for detection and/or diagnosis of SARS-CoV-2 by FDA under an Emergency Use Authorization (EUA).  This EUA will remain in effect (meaning this test can be used) for the duration of the COVID-19 declaration under Section 564(b)(1) of the Act, 21 U.S.C. section 360bbb-3(b)(1), unless the authorization is terminated or revoked sooner. Performed at [facility], [address] [place], [place] [address]    Dg Chest 1 View  Result Date: 06/29/2018 CLINICAL DATA:  Pain status post fall EXAM: CHEST  1 VIEW COMPARISON:  June 25, 2017 FINDINGS: Chronic lung changes are noted bilaterally consistent with emphysema. The lungs are hyperexpanded. The heart is mildly enlarged but stable from prior study. There is a stable rim calcified structure in the left upper quadrant of the abdomen. Aortic calcifications are noted. There appear to be some calcified hilar and mediastinal lymph nodes bilaterally. IMPRESSION: 1. No definite acute cardiopulmonary process. 2. Chronic lung changes bilaterally with somewhat hyperexpanded lungs which can be seen in patients with COPD. 3. Stable peripherally calcified structure in the left upper quadrant of unknown clinical significance. This could represent a partially calcified aneurysm in the appropriate clinical setting. Consider an outpatient nonemergent follow-up CT for further evaluation of this finding. Electronically Signed   By: Katherine Mantlehristopher   Green M.D.   On: 06/29/2018 19:50   Ct Head Wo Contrast  Result Date: 06/29/2018 CLINICAL DATA:  Fall EXAM: CT HEAD WITHOUT CONTRAST TECHNIQUE: Contiguous axial images were obtained from the base of the skull through the vertex without intravenous contrast. COMPARISON:  None. FINDINGS: Brain: There is no mass, hemorrhage or extra-axial collection. There is generalized atrophy without lobar predilection. Hypodensity of the white matter is most commonly associated with chronic microvascular disease. Vascular: Atherosclerotic calcification of the internal carotid arteries at the skull base. No abnormal hyperdensity of the major intracranial arteries or dural venous sinuses. Skull: The visualized skull base, calvarium and extracranial soft tissues are normal. Sinuses/Orbits: No fluid levels or advanced mucosal thickening of the visualized paranasal sinuses. No mastoid or middle ear effusion. The orbits are normal. IMPRESSION: Chronic small vessel ischemia and generalized volume loss without acute intracranial abnormality. Electronically Signed   By: Deatra RobinsonKevin  Herman M.D.   On: 06/29/2018 19:53   Dg Hip Unilat With Pelvis 2-3 Views Left  Result Date: 06/29/2018 CLINICAL DATA:  Fall EXAM: DG HIP (WITH OR WITHOUT PELVIS) 2-3V LEFT COMPARISON:  06/25/2017 FINDINGS: Prior intramedullary rodding of the right femur for old intertrochanteric fracture. Acute left intertrochanteric fracture with mild displacement and varus angulation of distal fracture fragment. Left femoral head projects in joint. IMPRESSION: 1. Acute left intertrochanteric fracture. 2. Prior intramedullary rodding of right intertrochanteric fracture Electronically Signed   By: Jasmine PangKim  Fujinaga M.D.   On: 06/29/2018 19:33    Pending Labs Unresulted Labs (From admission, onward)    Start     Ordered   07/06/18 0500  Creatinine, serum  (enoxaparin (LOVENOX)    CrCl >/= 30 ml/min)  Weekly,   R    Comments: while on enoxaparin therapy    06/29/18 2115    06/30/18 0500  Comprehensive metabolic panel  Tomorrow morning,   R     06/29/18 2115   06/30/18 0500  CBC  Tomorrow morning,   R     06/29/18 2115   06/30/18 0500  Hemoglobin A1c  Tomorrow morning,   R  06/29/18 2115   06/30/18 0500  Lipid panel  Tomorrow morning,   R     06/29/18 2115   06/30/18 0500  TSH  Tomorrow morning,   R     06/29/18 2115   06/30/18 0500  VITAMIN D 25 Hydroxy (Vit-D Deficiency, Fractures)  Tomorrow morning,   R     06/29/18 2115   06/30/18 0500  Vitamin B12  Tomorrow morning,   R     06/29/18 2115   06/30/18 0500  Folate RBC  Tomorrow morning,   R     06/29/18 2115   06/30/18 0500  Type and screen [place]  Once,   STAT    Comments: [place]    06/29/18 2115   06/30/18 0500  Ferritin  Tomorrow morning,   R     06/29/18 2115   06/30/18 0500  Iron and TIBC  Tomorrow morning,   R     06/29/18 2115   06/29/18 2113  Urinalysis, Routine w reflex microscopic  Once,   STAT     06/29/18 2115          Vitals/Pain Today's Vitals   06/29/18 2030 06/29/18 2100 06/29/18 2130 06/29/18 2201  BP: (!) 145/73 (!) 103/51 99/88 129/67  Pulse: 86 79 87 84  Resp: 17 (!) 22 20 16   Temp:      TempSrc:      SpO2: 100% 100% 100% 97%    Isolation Precautions No active isolations  Medications Medications  0.9 %  sodium chloride infusion ( Intravenous Stopped 06/29/18 2058)  enoxaparin (LOVENOX) injection 40 mg (has no administration in time range)  0.9 %  sodium chloride infusion ( Intravenous New Bag/Given 06/29/18 2125)  acetaminophen (TYLENOL) tablet 650 mg (has no administration in time range)    Or  acetaminophen (TYLENOL) suppository 650 mg (has no administration in time range)  ondansetron (ZOFRAN) injection 4 mg (has no administration in time range)  fentaNYL (SUBLIMAZE) injection 25 mcg (25 mcg Intravenous Given 06/29/18 1948)  HYDROmorphone (DILAUDID) injection 0.5 mg (0.5 mg Intravenous Given 06/29/18 2032)   ondansetron (ZOFRAN) injection 4 mg (4 mg Intravenous Given 06/29/18 2032)    Mobility non-ambulatory

## 2018-06-29 NOTE — H&P (Signed)
TRH H&P    Patient Demographics:    Christine Jennings, is a 85 y.o. female  MRN: [id number]  DOB - Nov 08, 1927  Admit Date - 06/29/2018  Referring MD/NP/PA: Idalia NeedleScott Zachowski  Outpatient Primary MD for the patient is Patient, No Pcp Per Samson FredericBrian Swinteck  Patient coming from:  home  Chief complaint-  Fall, left hip pain   HPI:    Christine Jennings  is a 85 y.o. female, w h/o R intertrochanteric fracture  Who had a mechanical fall.  Pt c/o left hip pain and brought to ER for evaluation.   In ED,  T 98 P 91 R 13, Bp 119/58  Pox 97% Wbc 18.8, hgb 11.6, Plt 163,  Na 140, K 4.5,  Bun 28, Creatinine 0.66  CT brain IMPRESSION: Chronic small vessel ischemia and generalized volume loss without acute intracranial abnormality.  Xray L hip IMPRESSION: 1. Acute left intertrochanteric fracture. 2. Prior intramedullary rodding of right intertrochanteric fracture  CXR IMPRESSION: 1. No definite acute cardiopulmonary process. 2. Chronic lung changes bilaterally with somewhat hyperexpanded lungs which can be seen in patients with COPD. 3. Stable peripherally calcified structure in the left upper quadrant of unknown clinical significance. This could represent a partially calcified aneurysm in the appropriate clinical setting. Consider an outpatient nonemergent follow-up CT for further evaluation of this finding.  Pt tx with fentanyl and dilaudid iv for pain in ED.   ED spoke with Dr. Linna CapriceSwinteck who requested that the patient be transferred [facility] due to lack of [facility].   Pt will be admitted for L intertrochanteric hip fracture.    Review of systems:    In addition to the HPI above,  No Fever-chills, No Headache, No changes with Vision or hearing, No problems swallowing food or Liquids, No Chest pain, Cough or Shortness of Breath, No Abdominal pain, No Nausea or Vomiting, bowel movements are  regular, No Blood in stool or Urine, No dysuria, No new skin rashes or bruises,  No new weakness, tingling, numbness in any extremity, No recent weight gain or loss, No polyuria, polydypsia or polyphagia, No significant Mental Stressors.  All other systems reviewed and are negative.    Past History of the following :    History reviewed. No pertinent past medical history.    Past Surgical History:  Procedure Laterality Date  . ABDOMINAL SURGERY     Cyst Removal   . INTRAMEDULLARY (IM) NAIL INTERTROCHANTERIC Right 06/25/2017   Procedure: INTRAMEDULLARY (IM) NAIL INTERTROCHANTRIC;  Surgeon: Samson FredericSwinteck, Brian, MD;  Location: [facility]S;  Service: Orthopedics;  Laterality: Right;  . Left Breast Surgery Left       Social History:      Social History   Tobacco Use  . Smoking status: Never Smoker  . Smokeless tobacco: Never Used  Substance Use Topics  . Alcohol use: Never    Frequency: Never       Family History :     Family History  Problem Relation Age of Onset  . Heart disease Father  Home Medications:   Prior to Admission medications   Medication Sig Start Date End Date Taking? Authorizing Provider  Ascorbic Acid (VITAMIN C PO) Take 1 tablet by mouth daily.    [provider]  Cholecalciferol (VITAMIN D PO) Take 1 tablet by mouth daily.    [provider]  docusate sodium (COLACE) 100 MG capsule Take 1 capsule (100 mg total) by mouth 2 (two) times daily. 06/28/17   Glade Lloyd, MD  enoxaparin (LOVENOX) 40 MG/0.4ML injection Inject 0.4 mLs (40 mg total) into the skin daily. 06/28/17   Swinteck, Arlys John, MD  feeding supplement, ENSURE ENLIVE, (ENSURE ENLIVE) LIQD Take 237 mLs by mouth 2 (two) times daily between meals. 06/28/17   Glade Lloyd, MD  HYDROcodone-acetaminophen (NORCO/VICODIN) 5-325 MG tablet Take 1-2 tablets by mouth every 6 (six) hours as needed (prn hip pain). 06/27/17   Swinteck, Arlys John, MD  ketoconazole (NIZORAL) 2 % cream Apply  1 fingertip amount to each foot daily. 09/01/17   Park Liter, DPM  Multiple Vitamin (MULTIVITAMIN WITH MINERALS) TABS tablet Take 1 tablet by mouth daily. 06/29/17   Glade Lloyd, MD  polyethylene glycol (MIRALAX / GLYCOLAX) packet Take 17 g by mouth daily as needed for mild constipation. 06/28/17   Glade Lloyd, MD     Allergies:    No Known Allergies   Physical Exam:   Vitals  Blood pressure (!) 119/58, pulse 86, temperature 98 F (36.7 C), temperature source Oral, resp. rate 13, SpO2 100 %.  1.  General: axoxo3  2. Psychiatric: euthymic  3. Neurologic: cn2-12 intact, reflexes 2+ symmetric, diffuse with no clonus, motor 5/5 in all 4 ext  4. HEENMT:  Anicteric, pupils 1.40mm symmetric, direct, consensual near intact Mmm Neck: no jvd  5. Respiratory : CTAB  6. Cardiovascular : rrr s1, s2, no m/g/r  7. Gastrointestinal:  Abd: soft, nt, nd, +bs  8. Skin:  Ext: no c/c/e,  No rash  9.Musculoskeletal:  Good ROM, didn't test left hip  No adenopathy    Data Review:    CBC Recent Labs  Lab 06/29/18 1954  WBC 18.8*  HGB 11.6*  HCT 35.7*  PLT 163  MCV 97.8  MCH 31.8  MCHC 32.5  RDW 12.1  LYMPHSABS 1.2  MONOABS 0.9  EOSABS 0.1  BASOSABS 0.0   ------------------------------------------------------------------------------------------------------------------  Results for orders placed or performed during the hospital encounter of 06/29/18 (from the past 48 hour(s))  CBC with Differential/Platelet     Status: Abnormal   Collection Time: 06/29/18  7:54 PM  Result Value Ref Range   WBC 18.8 (H) 4.0 - 10.5 K/uL   RBC 3.65 (L) 3.87 - 5.11 MIL/uL   Hemoglobin 11.6 (L) 12.0 - 15.0 g/dL   HCT 16.1 (L) 09.6 - 04.5 %   MCV 97.8 80.0 - 100.0 fL   MCH 31.8 26.0 - 34.0 pg   MCHC 32.5 30.0 - 36.0 g/dL   RDW 40.9 81.1 - 91.4 %   Platelets 163 150 - 400 K/uL   nRBC 0.0 0.0 - 0.2 %   Neutrophils Relative % 88 %   Neutro Abs 16.4 (H) 1.7 - 7.7 K/uL    Lymphocytes Relative 6 %   Lymphs Abs 1.2 0.7 - 4.0 K/uL   Monocytes Relative 5 %   Monocytes Absolute 0.9 0.1 - 1.0 K/uL   Eosinophils Relative 0 %   Eosinophils Absolute 0.1 0.0 - 0.5 K/uL   Basophils Relative 0 %   Basophils Absolute 0.0 0.0 -  0.1 K/uL   Immature Granulocytes 1 %   Abs Immature Granulocytes 0.16 (H) 0.00 - 0.07 K/uL    Comment: Performed at [facility], [place] [address] [place], [place] [address]  Comprehensive metabolic panel     Status: Abnormal   Collection Time: 06/29/18  7:54 PM  Result Value Ref Range   Sodium 140 135 - 145 mmol/L   Potassium 4.5 3.5 - 5.1 mmol/L   Chloride 105 98 - 111 mmol/L   CO2 29 22 - 32 mmol/L   Glucose, Bld 170 (H) 70 - 99 mg/dL   BUN 28 (H) 8 - 23 mg/dL   Creatinine, Ser 0.66 0.44 - 1.00 mg/dL   Calcium 9.1 8.9 - 10.3 mg/dL   Total Protein 5.8 (L) 6.5 - 8.1 g/dL   Albumin 3.5 3.5 - 5.0 g/dL   AST 24 15 - 41 U/L   ALT 24 0 - 44 U/L   Alkaline Phosphatase 50 38 - 126 U/L   Total Bilirubin 0.6 0.3 - 1.2 mg/dL   GFR calc non Af Amer >60 >60 mL/min   GFR calc Af Amer >60 >60 mL/min   Anion gap 6 5 - 15    Comment: Performed at [facility], [place] [address] [place], [place] [address]  Protime-INR     Status: None   Collection Time: 06/29/18  7:54 PM  Result Value Ref Range   Prothrombin Time 13.3 11.4 - 15.2 seconds   INR 1.0 0.8 - 1.2    Comment: (NOTE) INR goal varies based on device and disease states. Performed at [facility], [place] [address], [place], [place] [address]   SARS Coronavirus 2 (CEPHEID - Performed in [place] hospital lab), Hosp Order     Status: None   Collection Time: 06/29/18  8:13 PM   Specimen: Nasopharyngeal Swab  Result Value Ref Range   SARS Coronavirus 2 NEGATIVE NEGATIVE    Comment: (NOTE) If result is NEGATIVE SARS-CoV-2 target nucleic acids are NOT DETECTED. The SARS-CoV-2 RNA is generally detectable in upper and lower  respiratory  specimens during the acute phase of infection. The lowest  concentration of SARS-CoV-2 viral copies this assay can detect is 250  copies / mL. A negative result does not preclude SARS-CoV-2 infection  and should not be used as the sole basis for treatment or other  patient management decisions.  A negative result may occur with  improper specimen collection / handling, submission of specimen other  than nasopharyngeal swab, presence of viral mutation(s) within the  areas targeted by this assay, and inadequate number of viral copies  (<250 copies / mL). A negative result must be combined with clinical  observations, patient history, and epidemiological information. If result is POSITIVE SARS-CoV-2 target nucleic acids are DETECTED. The SARS-CoV-2 RNA is generally detectable in upper and lower  respiratory specimens dur ing the acute phase of infection.  Positive  results are indicative of active infection with SARS-CoV-2.  Clinical  correlation with patient history and other diagnostic information is  necessary to determine patient infection status.  Positive results do  not rule out bacterial infection or co-infection with other viruses. If result is PRESUMPTIVE POSTIVE SARS-CoV-2 nucleic acids MAY BE PRESENT.   A presumptive positive result was obtained on the submitted specimen  and confirmed on repeat testing.  While 2019 novel coronavirus  (SARS-CoV-2) nucleic acids may be present in the submitted sample  additional confirmatory testing may be necessary for  epidemiological  and / or clinical management purposes  to differentiate between  SARS-CoV-2 and other Sarbecovirus currently known to infect humans.  If clinically indicated additional testing with an alternate test  methodology [phone]([id number]) is advised. The SARS-CoV-2 RNA is generally  detectable in upper and lower respiratory sp ecimens during the acute  phase of infection. The expected result is Negative. Fact Sheet for  Patients:  BoilerBrush.com.cyhttps://www.fda.gov/media/136312/download Fact Sheet for Healthcare Providers: https://pope.com/https://www.fda.gov/media/136313/download This test is not yet approved or cleared by the [place] FDA and has been authorized for detection and/or diagnosis of SARS-CoV-2 by FDA under an Emergency Use Authorization (EUA).  This EUA will remain in effect (meaning this test can be used) for the duration of the COVID-19 declaration under Section 564(b)(1) of the Act, 21 U.S.C. section 360bbb-3(b)(1), unless the authorization is terminated or revoked sooner. Performed at [facility], [address] [place], [place] [address]     Chemistries  Recent Labs  Lab 06/29/18 1954  NA 140  K 4.5  CL 105  CO2 29  GLUCOSE 170*  BUN 28*  CREATININE 0.66  CALCIUM 9.1  AST 24  ALT 24  ALKPHOS 50  BILITOT 0.6   ------------------------------------------------------------------------------------------------------------------  ------------------------------------------------------------------------------------------------------------------ GFR: CrCl cannot be calculated (Unknown ideal weight.). Liver Function Tests: Recent Labs  Lab 06/29/18 1954  AST 24  ALT 24  ALKPHOS 50  BILITOT 0.6  PROT 5.8*  ALBUMIN 3.5   No results for input(s): LIPASE, AMYLASE in the last 168 hours. No results for input(s): AMMONIA in the last 168 hours. Coagulation Profile: Recent Labs  Lab 06/29/18 1954  INR 1.0   Cardiac Enzymes: No results for input(s): CKTOTAL, CKMB, CKMBINDEX, TROPONINI in the last 168 hours. BNP (last 3 results) No results for input(s): PROBNP in the last 8760 hours. HbA1C: No results for input(s): HGBA1C in the last 72 hours. CBG: No results for input(s): GLUCAP in the last 168 hours. Lipid Profile: No results for input(s): CHOL, HDL, LDLCALC, TRIG, CHOLHDL, LDLDIRECT in the last 72 hours. Thyroid Function Tests: No results for input(s): TSH, T4TOTAL,  FREET4, T3FREE, THYROIDAB in the last 72 hours. Anemia Panel: No results for input(s): VITAMINB12, FOLATE, FERRITIN, TIBC, IRON, RETICCTPCT in the last 72 hours.  --------------------------------------------------------------------------------------------------------------- Urine analysis: No results found for: COLORURINE, APPEARANCEUR, LABSPEC, PHURINE, GLUCOSEU, HGBUR, BILIRUBINUR, KETONESUR, PROTEINUR, UROBILINOGEN, NITRITE, LEUKOCYTESUR    Imaging Results:    Dg Chest 1 View  Result Date: 06/29/2018 CLINICAL DATA:  Pain status post fall EXAM: CHEST  1 VIEW COMPARISON:  June 25, 2017 FINDINGS: Chronic lung changes are noted bilaterally consistent with emphysema. The lungs are hyperexpanded. The heart is mildly enlarged but stable from prior study. There is a stable rim calcified structure in the left upper quadrant of the abdomen. Aortic calcifications are noted. There appear to be some calcified hilar and mediastinal lymph nodes bilaterally. IMPRESSION: 1. No definite acute cardiopulmonary process. 2. Chronic lung changes bilaterally with somewhat hyperexpanded lungs which can be seen in patients with COPD. 3. Stable peripherally calcified structure in the left upper quadrant of unknown clinical significance. This could represent a partially calcified aneurysm in the appropriate clinical setting. Consider an outpatient nonemergent follow-up CT for further evaluation of this finding. Electronically Signed   By: Katherine Mantlehristopher  Green M.D.   On: 06/29/2018 19:50   Ct Head Wo Contrast  Result Date: 06/29/2018 CLINICAL DATA:  Fall EXAM: CT HEAD WITHOUT CONTRAST TECHNIQUE: Contiguous axial images were obtained from the base of the skull  through the vertex without intravenous contrast. COMPARISON:  None. FINDINGS: Brain: There is no mass, hemorrhage or extra-axial collection. There is generalized atrophy without lobar predilection. Hypodensity of the white matter is most commonly associated with chronic  microvascular disease. Vascular: Atherosclerotic calcification of the internal carotid arteries at the skull base. No abnormal hyperdensity of the major intracranial arteries or dural venous sinuses. Skull: The visualized skull base, calvarium and extracranial soft tissues are normal. Sinuses/Orbits: No fluid levels or advanced mucosal thickening of the visualized paranasal sinuses. No mastoid or middle ear effusion. The orbits are normal. IMPRESSION: Chronic small vessel ischemia and generalized volume loss without acute intracranial abnormality. Electronically Signed   By: Deatra RobinsonKevin  Herman M.D.   On: 06/29/2018 19:53   Dg Hip Unilat With Pelvis 2-3 Views Left  Result Date: 06/29/2018 CLINICAL DATA:  Fall EXAM: DG HIP (WITH OR WITHOUT PELVIS) 2-3V LEFT COMPARISON:  06/25/2017 FINDINGS: Prior intramedullary rodding of the right femur for old intertrochanteric fracture. Acute left intertrochanteric fracture with mild displacement and varus angulation of distal fracture fragment. Left femoral head projects in joint. IMPRESSION: 1. Acute left intertrochanteric fracture. 2. Prior intramedullary rodding of right intertrochanteric fracture Electronically Signed   By: Jasmine PangKim  Fujinaga M.D.   On: 06/29/2018 19:33   nsr at 77, nl axis, nl int, no st-t changes c/w ischemia   Assessment & Plan:    Principal Problem:   Intertrochanteric fracture of left hip (HCC) Active Problems:   Anemia   Hyperglycemia  L hip intertrochanteric fracture NPO after [place] Dilaudid 0.5mg  iv q4h prn  zofran 4mg  iv q6h prn  PT/OT/ social work consults Pt is medically cleared to have surgery, benefit outweighs the risk.  I spoke with her son from [place] and updated him on her condition and that this was a similar type of injury as last year and will require similar surgery  Orthopedics consulted by ED, appreciate Dr. Linna CapriceSwinteck input  Anemia Check ferritin, iron, tibc, b12, folate Check cbc in am  Hyperglycemia Check hga1c,  lipid If Hga1c >6.5, please start on SSI  Osteoporosis Hold Fosamax for now Check vitamin D level   DVT Prophylaxis-   Lovenox  (start tomorrow) since probably going to OR in AM - SCDs   AM Labs Ordered, also please review Full Orders  Family Communication: Admission, patients condition and plan of care including tests being ordered have been discussed with the patient and her son who indicate understanding and agree with the plan and Code Status.  Code Status:  FULL CODE, informed pt son as above that pt will require surgery to fix L hip fracture and [facility], [phone] to call for updates tomorrow  Admission status: Inpatient: Based on patients clinical presentation and evaluation of above clinical data, I have made determination that patient meets Inpatient criteria at this time.   Pt will be admitted for L hip fracture and will require inpatient stay > 2 nites.   Time spent in minutes : 70   Pearson GrippeJames Sameen Leas M.D on 06/29/2018 at 11:08 PM

## 2018-06-29 NOTE — ED Notes (Signed)
Patient transported to CT 

## 2018-06-29 NOTE — ED Provider Notes (Signed)
[facility] DEPT Provider Note   CSN: [id number] Arrival date & time: 06/29/18  1832     History   Chief Complaint Chief Complaint  Patient presents with  . Fall  . Hip Pain    HPI Christine Jennings is a 85 y.o. female.     Patient brought in by EMS from home.  Patient reports that she was ambulating in the bathroom lost her footing fell backwards.  Patient complaining of pain to the left hip area.  EMS noted shortening and rotation.  Patient has a previous history of right femur fracture that was repaired by Dr. Lyla Jennings.  Patient reports a 10.  EMS gave her 50 mcg of fentanyl.  EMS also noted on room air sats were 88%.  Patient does not use home oxygen.  Patient denies hitting her head.  Denies any other complaints.  Patient does have a cervical collar in place.  Patient denies being on any blood thinners.     History reviewed. No pertinent past medical history.  Patient Active Problem List   Diagnosis Date Noted  . Protein-calorie malnutrition, severe 06/28/2017  . Hip fracture (Christine Jennings) 06/25/2017  . Closed displaced intertrochanteric fracture of right femur (Christine Jennings) 06/25/2017    Past Surgical History:  Procedure Laterality Date  . ABDOMINAL SURGERY     Cyst Removal   . INTRAMEDULLARY (IM) NAIL INTERTROCHANTERIC Right 06/25/2017   Procedure: INTRAMEDULLARY (IM) NAIL INTERTROCHANTRIC;  Surgeon: Rod Can, MD;  Location: [facility];  Service: Orthopedics;  Laterality: Right;  . Left Breast Surgery Left      OB History   No obstetric history on file.      Home Medications    Prior to Admission medications   Medication Sig Start Date End Date Taking? Authorizing Provider  Ascorbic Acid (VITAMIN C PO) Take 1 tablet by mouth daily.    [provider]  Cholecalciferol (VITAMIN D PO) Take 1 tablet by mouth daily.    [provider]  docusate sodium (COLACE) 100 MG capsule Take 1 capsule (100 mg total) by mouth 2 (two)  times daily. 06/28/17   Christine August, MD  enoxaparin (LOVENOX) 40 MG/0.4ML injection Inject 0.4 mLs (40 mg total) into the skin daily. 06/28/17   Jennings, Christine Edelman, MD  feeding supplement, ENSURE ENLIVE, (ENSURE ENLIVE) LIQD Take 237 mLs by mouth 2 (two) times daily between meals. 06/28/17   Christine August, MD  HYDROcodone-acetaminophen (NORCO/VICODIN) 5-325 MG tablet Take 1-2 tablets by mouth every 6 (six) hours as needed (prn hip pain). 06/27/17   Jennings, Christine Edelman, MD  ketoconazole (NIZORAL) 2 % cream Apply 1 fingertip amount to each foot daily. 09/01/17   Christine Jennings, DPM  Multiple Vitamin (MULTIVITAMIN WITH MINERALS) TABS tablet Take 1 tablet by mouth daily. 06/29/17   Christine August, MD  polyethylene glycol (MIRALAX / GLYCOLAX) packet Take 17 g by mouth daily as needed for mild constipation. 06/28/17   Christine August, MD    Family History History reviewed. No pertinent family history.  Social History Social History   Tobacco Use  . Smoking status: Never Smoker  . Smokeless tobacco: Never Used  Substance Use Topics  . Alcohol use: Never    Frequency: Never  . Drug use: Never     Allergies   Patient has no known allergies.   Review of Systems Review of Systems  Constitutional: Negative for chills and fever.  HENT: Negative for congestion, rhinorrhea and sore throat.   Eyes: Negative for visual  disturbance.  Respiratory: Negative for cough and shortness of breath.   Cardiovascular: Negative for chest pain and leg swelling.  Gastrointestinal: Negative for abdominal pain, diarrhea, nausea and vomiting.  Genitourinary: Negative for dysuria.  Musculoskeletal: Negative for back pain and neck pain.  Skin: Negative for rash.  Neurological: Negative for dizziness, light-headedness and headaches.  Hematological: Does not bruise/bleed easily.  Psychiatric/Behavioral: Negative for confusion.     Physical Exam Updated Vital Signs BP 132/63   Pulse 75   Temp 98 F (36.7 C) (Oral)    Resp 15   SpO2 97%   Physical Exam Vitals signs and nursing note reviewed.  Constitutional:      General: She is not in acute distress.    Appearance: She is well-developed.  HENT:     Head: Normocephalic and atraumatic.  Eyes:     Extraocular Movements: Extraocular movements intact.     Conjunctiva/sclera: Conjunctivae normal.     Pupils: Pupils are equal, round, and reactive to light.  Neck:     Musculoskeletal: Normal range of motion and neck supple. No neck rigidity.     Comments: Cervical collar was in place.  But removed patient with no posterior tenderness good range of motion. Cardiovascular:     Rate and Rhythm: Normal rate and regular rhythm.     Heart sounds: No murmur.  Pulmonary:     Effort: Pulmonary effort is normal. No respiratory distress.     Breath sounds: Normal breath sounds.  Abdominal:     General: Bowel sounds are normal.     Palpations: Abdomen is soft.     Tenderness: There is no abdominal tenderness.  Musculoskeletal: Normal range of motion.        General: Deformity present.     Comments: Patient with shortening of the left leg.  Patient does have good movement of toes sensation intact.  Dorsalis pedis pulses 1-2+.  Skin:    General: Skin is warm and dry.     Capillary Refill: Capillary refill takes less than 2 seconds.  Neurological:     General: No focal deficit present.     Mental Status: She is alert and oriented to person, place, and time.     Cranial Nerves: No cranial nerve deficit.     Sensory: No sensory deficit.     Motor: No weakness.      ED Treatments / Results  Labs (all labs ordered are listed, but only abnormal results are displayed) Labs Reviewed  CBC WITH DIFFERENTIAL/PLATELET - Abnormal; Notable for the following components:      Result Value   WBC 18.8 (*)    RBC 3.65 (*)    Hemoglobin 11.6 (*)    HCT 35.7 (*)    Neutro Abs 16.4 (*)    Abs Immature Granulocytes 0.16 (*)    All other components within normal  limits  COMPREHENSIVE METABOLIC PANEL - Abnormal; Notable for the following components:   Glucose, Bld 170 (*)    BUN 28 (*)    Total Protein 5.8 (*)    All other components within normal limits  SARS CORONAVIRUS 2 (HOSPITAL ORDER, PERFORMED IN [facility])  PROTIME-INR    EKG    Radiology Dg Chest 1 View  Result Date: 06/29/2018 CLINICAL DATA:  Pain status post fall EXAM: CHEST  1 VIEW COMPARISON:  June 25, 2017 FINDINGS: Chronic lung changes are noted bilaterally consistent with emphysema. The lungs are hyperexpanded. The heart is mildly enlarged but stable  from prior study. There is a stable rim calcified structure in the left upper quadrant of the abdomen. Aortic calcifications are noted. There appear to be some calcified hilar and mediastinal lymph nodes bilaterally. IMPRESSION: 1. No definite acute cardiopulmonary process. 2. Chronic lung changes bilaterally with somewhat hyperexpanded lungs which can be seen in patients with COPD. 3. Stable peripherally calcified structure in the left upper quadrant of unknown clinical significance. This could represent a partially calcified aneurysm in the appropriate clinical setting. Consider an outpatient nonemergent follow-up CT for further evaluation of this finding. Electronically Signed   By: Katherine Mantlehristopher  Green M.D.   On: 06/29/2018 19:50   Ct Head Wo Contrast  Result Date: 06/29/2018 CLINICAL DATA:  Fall EXAM: CT HEAD WITHOUT CONTRAST TECHNIQUE: Contiguous axial images were obtained from the base of the skull through the vertex without intravenous contrast. COMPARISON:  None. FINDINGS: Brain: There is no mass, hemorrhage or extra-axial collection. There is generalized atrophy without lobar predilection. Hypodensity of the white matter is most commonly associated with chronic microvascular disease. Vascular: Atherosclerotic calcification of the internal carotid arteries at the skull base. No abnormal hyperdensity of the major  intracranial arteries or dural venous sinuses. Skull: The visualized skull base, calvarium and extracranial soft tissues are normal. Sinuses/Orbits: No fluid levels or advanced mucosal thickening of the visualized paranasal sinuses. No mastoid or middle ear effusion. The orbits are normal. IMPRESSION: Chronic small vessel ischemia and generalized volume loss without acute intracranial abnormality. Electronically Signed   By: Deatra RobinsonKevin  Herman M.D.   On: 06/29/2018 19:53   Dg Hip Unilat With Pelvis 2-3 Views Left  Result Date: 06/29/2018 CLINICAL DATA:  Fall EXAM: DG HIP (WITH OR WITHOUT PELVIS) 2-3V LEFT COMPARISON:  06/25/2017 FINDINGS: Prior intramedullary rodding of the right femur for old intertrochanteric fracture. Acute left intertrochanteric fracture with mild displacement and varus angulation of distal fracture fragment. Left femoral head projects in joint. IMPRESSION: 1. Acute left intertrochanteric fracture. 2. Prior intramedullary rodding of right intertrochanteric fracture Electronically Signed   By: Jasmine PangKim  Fujinaga M.D.   On: 06/29/2018 19:33    Procedures Procedures (including critical care time)  Medications Ordered in ED Medications  0.9 %  sodium chloride infusion ( Intravenous New Bag/Given 06/29/18 1948)  HYDROmorphone (DILAUDID) injection 0.5 mg (has no administration in time range)  ondansetron (ZOFRAN) injection 4 mg (has no administration in time range)  fentaNYL (SUBLIMAZE) injection 25 mcg (25 mcg Intravenous Given 06/29/18 1948)     Initial Impression / Assessment and Plan / ED Course  I have reviewed the triage vital signs and the nursing notes.  Pertinent labs & imaging results that were available during my care of the patient were reviewed by me and considered in my medical decision making (see chart for details).        Patient does not use oxygen at home but does have an oxygen requirement here.  Needs 2 L of oxygen her sats in the low 90s.  Patient not in any  respiratory distress.  Chest x-ray just showed some emphysema or COPD changes no evidence of pneumonia.  Cover testing was ordered.  X-rays of her pelvis and left hip show inner troches fracture.  Contacted Dr. Linna CapriceSwinteck from orthopedics.  He will repaired in the morning.  [facility]  Head CT showed no evidence of any trauma.  Injury.  Final Clinical Impressions(s) / ED Diagnoses  Final diagnoses:  Fall, initial encounter  Hypoxia  Closed fracture of left hip, initial encounter [facility](HCC)    ED Discharge Orders    None       Vanetta MuldersZackowski, Kazden Largo, MD 06/29/18 2124

## 2018-06-30 ENCOUNTER — Encounter (HOSPITAL_COMMUNITY): Payer: Self-pay | Admitting: Anesthesiology

## 2018-06-30 ENCOUNTER — Inpatient Hospital Stay (HOSPITAL_COMMUNITY): Payer: Medicare Other

## 2018-06-30 ENCOUNTER — Inpatient Hospital Stay (HOSPITAL_COMMUNITY): Payer: Medicare Other | Admitting: Anesthesiology

## 2018-06-30 ENCOUNTER — Encounter (HOSPITAL_COMMUNITY)
Admission: EM | Disposition: A | Discharge status: Skilled Nursing Facility | Payer: Self-pay | Source: Home / Self Care | Attending: Internal Medicine

## 2018-06-30 DIAGNOSIS — S72142D Displaced intertrochanteric fracture of left femur, subsequent encounter for closed fracture with routine healing: Secondary | ICD-10-CM

## 2018-06-30 DIAGNOSIS — S72142A Displaced intertrochanteric fracture of left femur, initial encounter for closed fracture: Secondary | ICD-10-CM | POA: Diagnosis present

## 2018-06-30 HISTORY — PX: INTRAMEDULLARY (IM) NAIL INTERTROCHANTERIC: SHX5875

## 2018-06-30 LAB — URINALYSIS, ROUTINE W REFLEX MICROSCOPIC
Bilirubin Urine: NEGATIVE
Glucose, UA: NEGATIVE mg/dL
Hgb urine dipstick: NEGATIVE
Ketones, ur: NEGATIVE mg/dL
Leukocytes,Ua: NEGATIVE
Nitrite: NEGATIVE
Protein, ur: NEGATIVE mg/dL
Specific Gravity, Urine: 1.016 (ref 1.005–1.030)
pH: 5 (ref 5.0–8.0)

## 2018-06-30 LAB — CBC
HCT: 27.7 % — ABNORMAL LOW (ref 36.0–46.0)
Hemoglobin: 9 g/dL — ABNORMAL LOW (ref 12.0–15.0)
MCH: 31.1 pg (ref 26.0–34.0)
MCHC: 32.5 g/dL (ref 30.0–36.0)
MCV: 95.8 fL (ref 80.0–100.0)
Platelets: UNDETERMINED 10*3/uL (ref 150–400)
RBC: 2.89 MIL/uL — ABNORMAL LOW (ref 3.87–5.11)
RDW: 12.2 % (ref 11.5–15.5)
WBC: 11.6 10*3/uL — ABNORMAL HIGH (ref 4.0–10.5)
nRBC: 0 % (ref 0.0–0.2)

## 2018-06-30 LAB — CREATININE, SERUM
Creatinine, Ser: 0.7 mg/dL (ref 0.44–1.00)
GFR calc Af Amer: >60 mL/min (ref >60)
GFR calc non Af Amer: >60 mL/min (ref >60)

## 2018-06-30 LAB — SURGICAL PCR SCREEN
MRSA, PCR: NEGATIVE
Staphylococcus aureus: POSITIVE — AB

## 2018-06-30 SURGERY — FIXATION, FRACTURE, INTERTROCHANTERIC, WITH INTRAMEDULLARY ROD
Anesthesia: Spinal | Site: Hip | Laterality: Left

## 2018-06-30 MED ORDER — CEFAZOLIN SODIUM-DEXTROSE 2-4 GM/100ML-% IV SOLN
2.0000 g | Freq: Four times a day (QID) | INTRAVENOUS | Status: AC
  Administered 2018-06-30 (×2): 2 g via INTRAVENOUS
  Filled 2018-06-30 (×2): qty 100

## 2018-06-30 MED ORDER — DOCUSATE SODIUM 100 MG PO CAPS
100.0000 mg | ORAL_CAPSULE | Freq: Two times a day (BID) | ORAL | Status: DC
  Administered 2018-06-30 – 2018-07-04 (×9): 100 mg via ORAL
  Filled 2018-06-30 (×9): qty 1

## 2018-06-30 MED ORDER — ACETAMINOPHEN 325 MG PO TABS: 325.0000 mg | ORAL_TABLET | Freq: Four times a day (QID) | ORAL | Status: DC | PRN

## 2018-06-30 MED ORDER — CHLORHEXIDINE GLUCONATE 4 % EX LIQD: 60.0000 mL | Freq: Once | CUTANEOUS | Status: DC

## 2018-06-30 MED ORDER — ONDANSETRON HCL 4 MG/2ML IJ SOLN
INTRAMUSCULAR | Status: AC
  Filled 2018-06-30: qty 2

## 2018-06-30 MED ORDER — PHENOL 1.4 % MT LIQD: 1.0000 | OROMUCOSAL | Status: DC | PRN

## 2018-06-30 MED ORDER — FENTANYL CITRATE (PF) 100 MCG/2ML IJ SOLN
INTRAMUSCULAR | Status: AC
  Filled 2018-06-30: qty 2

## 2018-06-30 MED ORDER — POVIDONE-IODINE 10 % EX SWAB: 2.0000 "application " | Freq: Once | CUTANEOUS | Status: DC

## 2018-06-30 MED ORDER — ONDANSETRON HCL 4 MG/2ML IJ SOLN
INTRAMUSCULAR | Status: DC | PRN
  Administered 2018-06-30: 4 mg via INTRAVENOUS

## 2018-06-30 MED ORDER — METOCLOPRAMIDE HCL 5 MG PO TABS: 5.0000 mg | ORAL_TABLET | Freq: Three times a day (TID) | ORAL | Status: DC | PRN

## 2018-06-30 MED ORDER — METOCLOPRAMIDE HCL 5 MG/ML IJ SOLN: 5.0000 mg | Freq: Three times a day (TID) | INTRAMUSCULAR | Status: DC | PRN

## 2018-06-30 MED ORDER — POLYETHYLENE GLYCOL 3350 17 G PO PACK: 17.0000 g | PACK | Freq: Every day | ORAL | Status: DC | PRN

## 2018-06-30 MED ORDER — PHENYLEPHRINE 40 MCG/ML (10ML) SYRINGE FOR IV PUSH (FOR BLOOD PRESSURE SUPPORT)
PREFILLED_SYRINGE | INTRAVENOUS | Status: DC | PRN
  Administered 2018-06-30 (×5): 80 ug via INTRAVENOUS

## 2018-06-30 MED ORDER — LIDOCAINE 2% (20 MG/ML) 5 ML SYRINGE
INTRAMUSCULAR | Status: AC
  Filled 2018-06-30: qty 5

## 2018-06-30 MED ORDER — MIDAZOLAM HCL 2 MG/2ML IJ SOLN
INTRAMUSCULAR | Status: AC
  Filled 2018-06-30: qty 2

## 2018-06-30 MED ORDER — PHENYLEPHRINE 40 MCG/ML (10ML) SYRINGE FOR IV PUSH (FOR BLOOD PRESSURE SUPPORT)
PREFILLED_SYRINGE | INTRAVENOUS | Status: AC
  Filled 2018-06-30: qty 10

## 2018-06-30 MED ORDER — SENNA 8.6 MG PO TABS
1.0000 | ORAL_TABLET | Freq: Two times a day (BID) | ORAL | Status: DC
  Administered 2018-06-30 – 2018-07-04 (×9): 8.6 mg via ORAL
  Filled 2018-06-30 (×9): qty 1

## 2018-06-30 MED ORDER — PROPOFOL 10 MG/ML IV BOLUS
INTRAVENOUS | Status: AC
  Filled 2018-06-30: qty 20

## 2018-06-30 MED ORDER — CEFAZOLIN SODIUM-DEXTROSE 2-4 GM/100ML-% IV SOLN
INTRAVENOUS | Status: AC
  Filled 2018-06-30: qty 100

## 2018-06-30 MED ORDER — SODIUM CHLORIDE 0.9 % IV SOLN
INTRAVENOUS | Status: DC | PRN
  Administered 2018-06-30: 25 ug/min via INTRAVENOUS

## 2018-06-30 MED ORDER — FENTANYL CITRATE (PF) 250 MCG/5ML IJ SOLN
INTRAMUSCULAR | Status: AC
  Filled 2018-06-30: qty 5

## 2018-06-30 MED ORDER — PROPOFOL 10 MG/ML IV BOLUS
INTRAVENOUS | Status: DC | PRN
  Administered 2018-06-30: 30 mg via INTRAVENOUS

## 2018-06-30 MED ORDER — ENOXAPARIN SODIUM 40 MG/0.4ML ~~LOC~~ SOLN
40.0000 mg | SUBCUTANEOUS | Status: DC
  Administered 2018-07-01 – 2018-07-04 (×4): 40 mg via SUBCUTANEOUS
  Filled 2018-06-30 (×4): qty 0.4

## 2018-06-30 MED ORDER — CEFAZOLIN SODIUM-DEXTROSE 2-4 GM/100ML-% IV SOLN
2.0000 g | INTRAVENOUS | Status: AC
  Administered 2018-06-30: 10:00:00 2 g via INTRAVENOUS

## 2018-06-30 MED ORDER — PROPOFOL 500 MG/50ML IV EMUL
INTRAVENOUS | Status: DC | PRN
  Administered 2018-06-30: 15 ug/kg/min via INTRAVENOUS

## 2018-06-30 MED ORDER — ONDANSETRON HCL 4 MG/2ML IJ SOLN: 4.0000 mg | Freq: Four times a day (QID) | INTRAMUSCULAR | Status: DC | PRN

## 2018-06-30 MED ORDER — MORPHINE SULFATE (PF) 2 MG/ML IV SOLN
0.5000 mg | INTRAVENOUS | Status: DC | PRN
  Administered 2018-07-01: 0.5 mg via INTRAVENOUS
  Filled 2018-06-30: qty 1

## 2018-06-30 MED ORDER — LACTATED RINGERS IV SOLN
INTRAVENOUS | Status: DC | PRN
  Administered 2018-06-30: 07:00:00 via INTRAVENOUS

## 2018-06-30 MED ORDER — TRANEXAMIC ACID-NACL 1000-0.7 MG/100ML-% IV SOLN
1000.0000 mg | INTRAVENOUS | Status: AC
  Administered 2018-06-30: 1000 mg via INTRAVENOUS

## 2018-06-30 MED ORDER — ENSURE PRE-SURGERY PO LIQD
296.0000 mL | Freq: Once | ORAL | Status: DC
  Filled 2018-06-30: qty 296

## 2018-06-30 MED ORDER — ONDANSETRON HCL 4 MG PO TABS: 4.0000 mg | ORAL_TABLET | Freq: Four times a day (QID) | ORAL | Status: DC | PRN

## 2018-06-30 MED ORDER — TRANEXAMIC ACID-NACL 1000-0.7 MG/100ML-% IV SOLN
INTRAVENOUS | Status: AC
  Filled 2018-06-30: qty 100

## 2018-06-30 MED ORDER — HYDROCODONE-ACETAMINOPHEN 7.5-325 MG PO TABS
1.0000 | ORAL_TABLET | ORAL | Status: DC | PRN
  Administered 2018-07-01 – 2018-07-04 (×4): 2 via ORAL
  Filled 2018-06-30 (×4): qty 2

## 2018-06-30 MED ORDER — PROPOFOL 500 MG/50ML IV EMUL: INTRAVENOUS | Status: DC | PRN

## 2018-06-30 MED ORDER — 0.9 % SODIUM CHLORIDE (POUR BTL) OPTIME
TOPICAL | Status: DC | PRN
  Administered 2018-06-30: 1000 mL

## 2018-06-30 MED ORDER — HYDROCODONE-ACETAMINOPHEN 5-325 MG PO TABS
1.0000 | ORAL_TABLET | ORAL | Status: DC | PRN
  Administered 2018-06-30: 2 via ORAL
  Administered 2018-06-30: 1 via ORAL
  Administered 2018-07-01 – 2018-07-03 (×6): 2 via ORAL
  Filled 2018-06-30: qty 1
  Filled 2018-06-30 (×8): qty 2

## 2018-06-30 MED ORDER — FENTANYL CITRATE (PF) 100 MCG/2ML IJ SOLN
INTRAMUSCULAR | Status: DC | PRN
  Administered 2018-06-30: 12.5 ug via INTRAVENOUS
  Administered 2018-06-30 (×3): 25 ug via INTRAVENOUS
  Administered 2018-06-30: 12.5 ug via INTRAVENOUS

## 2018-06-30 MED ORDER — MENTHOL 3 MG MT LOZG: 1.0000 | LOZENGE | OROMUCOSAL | Status: DC | PRN

## 2018-06-30 SURGICAL SUPPLY — 50 items
ALCOHOL ISOPROPYL (RUBBING) (MISCELLANEOUS) ×2 IMPLANT
BIT DRILL CANN LG 4.3MM (BIT) IMPLANT
BNDG COHESIVE 4X5 TAN STRL (GAUZE/BANDAGES/DRESSINGS) IMPLANT
CHLORAPREP W/TINT 26 (MISCELLANEOUS) ×2 IMPLANT
COVER PERINEAL POST (MISCELLANEOUS) ×2 IMPLANT
COVER SURGICAL LIGHT HANDLE (MISCELLANEOUS) ×2 IMPLANT
COVER WAND RF STERILE (DRAPES) ×2 IMPLANT
DERMABOND ADVANCED (GAUZE/BANDAGES/DRESSINGS) ×1
DERMABOND ADVANCED .7 DNX12 (GAUZE/BANDAGES/DRESSINGS) ×2 IMPLANT
DRAPE C-ARM 42X72 X-RAY (DRAPES) ×2 IMPLANT
DRAPE C-ARMOR (DRAPES) ×2 IMPLANT
DRAPE HALF SHEET 40X57 (DRAPES) ×2 IMPLANT
DRAPE IMP U-DRAPE 54X76 (DRAPES) ×5 IMPLANT
DRAPE STERI IOBAN 125X83 (DRAPES) ×2 IMPLANT
DRAPE U-SHAPE 47X51 STRL (DRAPES) ×4 IMPLANT
DRAPE UNIVERSAL PACK (DRAPES) ×2 IMPLANT
DRILL BIT CANN LG 4.3MM (BIT) ×2
DRSG AQUACEL AG ADV 3.5X 4 (GAUZE/BANDAGES/DRESSINGS) ×1 IMPLANT
DRSG AQUACEL AG ADV 3.5X 6 (GAUZE/BANDAGES/DRESSINGS) ×1 IMPLANT
DRSG MEPILEX BORDER 4X4 (GAUZE/BANDAGES/DRESSINGS) ×4 IMPLANT
DRSG PAD ABDOMINAL 8X10 ST (GAUZE/BANDAGES/DRESSINGS) IMPLANT
ELECT REM PT RETURN 9FT ADLT (ELECTROSURGICAL) ×2
ELECTRODE REM PT RTRN 9FT ADLT (ELECTROSURGICAL) ×1 IMPLANT
FACESHIELD WRAPAROUND (MASK) ×2 IMPLANT
FACESHIELD WRAPAROUND OR TEAM (MASK) ×1 IMPLANT
GLOVE BIO SURGEON STRL SZ8.5 (GLOVE) ×4 IMPLANT
GLOVE BIOGEL PI IND STRL 8.5 (GLOVE) ×1 IMPLANT
GLOVE BIOGEL PI INDICATOR 8.5 (GLOVE) ×1
GOWN STRL REUS W/ TWL LRG LVL3 (GOWN DISPOSABLE) ×2 IMPLANT
GOWN STRL REUS W/TWL 2XL LVL3 (GOWN DISPOSABLE) ×2 IMPLANT
GOWN STRL REUS W/TWL LRG LVL3 (GOWN DISPOSABLE) ×2
GUIDEPIN 3.2X17.5 THRD DISP (PIN) ×1 IMPLANT
HIP FRA NAIL LAG SCREW 10.5X90 (Orthopedic Implant) ×2 IMPLANT
KIT TURNOVER KIT B (KITS) ×2 IMPLANT
MANIFOLD NEPTUNE II (INSTRUMENTS) ×2 IMPLANT
MARKER SKIN DUAL TIP RULER LAB (MISCELLANEOUS) ×2 IMPLANT
NAIL HIP FRACT 130D 11X180 (Screw) ×1 IMPLANT
NS IRRIG 1000ML POUR BTL (IV SOLUTION) ×2 IMPLANT
PACK GENERAL/GYN (CUSTOM PROCEDURE TRAY) ×2 IMPLANT
PAD ARMBOARD 7.5X6 YLW CONV (MISCELLANEOUS) ×4 IMPLANT
PADDING CAST ABS 4INX4YD NS (CAST SUPPLIES)
PADDING CAST ABS COTTON 4X4 ST (CAST SUPPLIES) IMPLANT
SCREW BONE CORTICAL 5.0X32 (Screw) ×1 IMPLANT
SCREW LAG HIP FRA NAIL 10.5X90 (Orthopedic Implant) IMPLANT
SUT MNCRL AB 3-0 PS2 27 (SUTURE) ×2 IMPLANT
SUT MON AB 2-0 CT1 27 (SUTURE) ×2 IMPLANT
SUT VIC AB 1 CT1 27 (SUTURE) ×1
SUT VIC AB 1 CT1 27XBRD ANBCTR (SUTURE) ×1 IMPLANT
TOWEL GREEN STERILE FF (TOWEL DISPOSABLE) ×2 IMPLANT
TOWEL OR 17X26 10 PK STRL BLUE (TOWEL DISPOSABLE) ×2 IMPLANT

## 2018-06-30 NOTE — Progress Notes (Signed)
OT Cancellation Note  Patient Details Name: Christine Jennings MRN: [id number] DOB: June 08, 1927   Cancelled Treatment:    Reason Eval/Treat Not Completed: Patient at procedure or test/ unavailable. Pt currently in surgery.  [place], OTR/L Acute Rehab Services Pager [phone] Office [phone]     Almon Register 06/30/2018, 7:47 AM

## 2018-06-30 NOTE — Anesthesia Procedure Notes (Signed)
Procedure Name: MAC Date/Time: 06/30/2018 9:36 AM Performed by: Kathryne Hitch, CRNA Pre-anesthesia Checklist: Patient identified, Emergency Drugs available, Suction available, Patient being monitored and Timeout performed Patient Re-evaluated:Patient Re-evaluated prior to induction Oxygen Delivery Method: Simple face mask Preoxygenation: Pre-oxygenation with 100% oxygen Dental Injury: Teeth and Oropharynx as per pre-operative assessment

## 2018-06-30 NOTE — Transfer of Care (Signed)
Immediate Anesthesia Transfer of Care Note  Patient: Christine Jennings  Procedure(s) Performed: INTRAMEDULLARY (IM) NAIL INTERTROCHANTRIC (Left Hip)  Patient Location: PACU  Anesthesia Type:General and Spinal  Level of Consciousness: awake, alert , oriented and patient cooperative  Airway & Oxygen Therapy: Patient Spontanous Breathing  Post-op Assessment: Report given to RN and Post -op Vital signs reviewed and stable  Post vital signs: Reviewed and stable  Last Vitals:  Vitals Value Taken Time  BP 150/68 06/30/18 1040  Temp    Pulse    Resp 20 06/30/18 1040  SpO2    Vitals shown include unvalidated device data.  Last Pain:  Vitals:   06/30/18 0432  TempSrc: Oral  PainSc:       Patients Stated Pain Goal: 3 (78/67/67 2094)  Complications: No apparent anesthesia complications

## 2018-06-30 NOTE — Progress Notes (Signed)
PROGRESS NOTE  Christine Jennings ZOX:[id number] DOB: 1927/12/02 DOA: 06/29/2018 PCP: Patient, No Pcp Per  Brief History   Christine Jennings Steyer  is a 85 y.o. female, w h/o R intertrochanteric fracture  Who had a mechanical fall.  Pt c/o left hip pain and brought to ER for evaluation. She was found to have suffered a left intertrochanteric fracture.   [facility] hospitalists were consulted to admit the patient for further evaluation and treatment. Orthopedic surgery was consulted to address the hip fracture. The patient went to the OR this morning for intramedullary fixation of the left femur.  Consultants  . Orthopedic surgery  Procedures  . Intramedullary fixation of the left femur.  Antibiotics   Anti-infectives (From admission, onward)   Start     Dose/Rate Route Frequency Ordered Stop   06/30/18 1530  ceFAZolin (ANCEF) IVPB 2g/100 mL premix     2 g 200 mL/hr over 30 Minutes Intravenous Every 6 hours 06/30/18 1208 07/01/18 0329   06/30/18 0830  ceFAZolin (ANCEF) IVPB 2g/100 mL premix     2 g 200 mL/hr over 30 Minutes Intravenous On call to O.R. 06/30/18 0827 06/30/18 1000   06/30/18 0715  ceFAZolin (ANCEF) 2-4 GM/100ML-% IVPB    Note to Pharmacy: Romie Minusock, Jennifer   : cabinet override      06/30/18 0715 06/30/18 0930    .  Marland Kitchen.   Subjective  The patient is seen post-operatively. She is somewhat somnolent as she has just returned from surgery. No new complaints.  Objective   Vitals:  Vitals:   06/30/18 1115 06/30/18 1226  BP: (!) 125/49 134/70  Pulse: 93 89  Resp: 20 20  Temp: 98.1 F (36.7 C) 98.1 F (36.7 C)  SpO2: 99% 100%    Exam:  Constitutional:  . The patient is somnolent, but easily arousable. No acute distress. Respiratory:  . No increased work of breathing. . No wheezes, rales, or rhonchi. No tactile fremitus. . No tactile fremitus Cardiovascular:  . Regular rate and rhythm . No murmurs, ectopy, or gallups. . No lateral PMI. No thrills. Abdomen:  . Abdomen is  soft, non-tender, non-distended. . No hernias, masses, or organomegaly. . Hypoactive bowel sounds.  Musculoskeletal:  . No cyanosis, clubbing, or edema. Peri [place]. Good capillary refill. Skin:  . No rashes, lesions, ulcers . palpation of skin: no induration or nodules Neurologic:  . I am unable to evaluate due to the patient's inability to cooperate with exam. Psychiatric:  I am unable to evaluate due to the patient's inability to cooperate with exam.   I have personally reviewed the following:   Today's Data  . Vitals, CBC   Scheduled Meds: . docusate sodium  100 mg Oral BID  . [START ON 07/01/2018] enoxaparin (LOVENOX) injection  40 mg Subcutaneous Q24H  . senna  1 tablet Oral BID   Continuous Infusions: .  ceFAZolin (ANCEF) IV Stopped (06/30/18 1525)    Principal Problem:   Intertrochanteric fracture of left hip (HCC) Active Problems:   Anemia   Hyperglycemia   Closed comminuted intertrochanteric fracture of left femur (HCC)   LOS: 1 day   A & P  L hip intertrochanteric fracture: As per orthopedic surgery. S/P intramedullary fixation. PT/OT/ social work consults.  Anemia:Check ferritin, iron, tibc, b12, folate. Check cbc in am.  Hyperglycemia: Check hga1c, lipid. If Hga1c >6.5, please start on SSI  Osteoporosis: Hold Fosamax for now. Check vitamin D level.  DVT Prophylaxis   Lovenox  (start tomorrow) .  Family  Communication: Discussed the patient in detail with her son Christine Jennings. All questions answered to the best of my ability. I have spent 45 minutes in her evaluation and care. Code Status:  FULL CODE  Kripa Foskey, DO [facility] Direct contact: see [email or web address]  7PM-7AM contact night coverage as above 06/30/2018, 4:21 PM  LOS: 1 day

## 2018-06-30 NOTE — Progress Notes (Signed)
CSW received a phone call from the patient's son, Hatice Bubel. He lives in [place]. He asked to speak with me about his mother's discharge planning. He stated that up until last year, his mother was living independently and driving around. He stated this time last year she had the same procedure on her left leg.   CSW stated that she is awaiting PT and OT evaluations to base disposition recommendation off of. He stated that he is concerned with his mother having to go to skilled nursing rehab with COVID-19. CSW stated SNF or HH could be options. CSW stated that if [facility] was the option the patient may need additional help. He stated that she does have a private aid that assists her with cooking, shopping, etc. The aid is private pay. The patient is also followed through an agency by the name of [employer] with [employer] and her case manager is Billy Fischer. Her phone number is [phone].   CSW explained to Merry Proud that once she completes her initial evaluation with the patient and she gives permission to speak with him, she would be in contact about next steps.   Domenic Schwab, MSW, [place]

## 2018-06-30 NOTE — Anesthesia Procedure Notes (Signed)
Spinal  Patient location during procedure: OR Start time: 06/30/2018 9:15 AM End time: 06/30/2018 9:22 AM Staffing Anesthesiologist: Roberts Gaudy, MD Performed: anesthesiologist  Preanesthetic Checklist Completed: patient identified, site marked, surgical consent, pre-op evaluation, timeout performed, IV checked, risks and benefits discussed and monitors and equipment checked Spinal Block Patient position: sitting Prep: DuraPrep Patient monitoring: heart rate, cardiac monitor, continuous pulse ox and blood pressure Approach: midline Location: L3-4 Injection technique: single-shot Needle Needle type: Sprotte  Needle gauge: 24 G Needle length: 9 cm Assessment Sensory level: T4 Additional Notes 22 G L3-4 midline 12 mg 0.75% Bupivacaine injected easily

## 2018-06-30 NOTE — Op Note (Signed)
OPERATIVE REPORT  SURGEON: Rod Can, MD   ASSISTANT: Staff.  PREOPERATIVE DIAGNOSIS: Left intertrochanteric femur fracture.   POSTOPERATIVE DIAGNOSIS: Left intertrochanteric femur fracture.   PROCEDURE: Intramedullary fixation, Left femur.   IMPLANTS: Biomet Affixus Hip Fracture Nail, 11 by 180 mm, 130 degrees. 10.5 x 90 mm Hip Fracture Nail Lag Screw. 5 x 32 mm distal interlocking screw 1.  ANESTHESIA:  Spinal  ESTIMATED BLOOD LOSS:-50 mL    ANTIBIOTICS: 2 g Ancef.  DRAINS: None.  COMPLICATIONS: None.   CONDITION: PACU - hemodynamically stable.Marland Kitchen   BRIEF CLINICAL NOTE: Christine Jennings is a 85 y.o. female who presented with an intertrochanteric femur fracture. The patient was admitted to the hospitalist service and underwent perioperative risk stratification and medical optimization. The risks, benefits, and alternatives to the procedure were explained, and the patient elected to proceed.  PROCEDURE IN DETAIL: Surgical site was marked by myself. The patient was taken to the operating room and anesthesia was induced on the bed. The patient was then transferred to the [facility] table and the nonoperative lower extremity was scissored underneath the operative side. The fracture was reduced with traction, internal rotation, and adduction. The hip was prepped and draped in the normal sterile surgical fashion. Timeout was called verifying side and site of surgery. Preop antibiotics were given with 60 minutes of beginning the procedure.  Fluoroscopy was used to define the patient's anatomy. A 4 cm incision was made just proximal to the tip of the greater trochanter. The awl was used to obtain the standard starting point for a trochanteric entry nail under fluoroscopic control. The guidepin was placed. The entry reamer was used to open the proximal femur.  On the back table, the nail was assembled onto the jig. The nail was placed into the femur without any difficulty. Through a separate  stab incision, the cannula was placed down to the bone in preparation for the cephalomedullary device. A guidepin was placed into the femoral head using AP and lateral fluoroscopy views. The pin was measured, and then reaming was performed to the appropriate depth. The lag screw was inserted to the appropriate depth. The fracture was compressed through the jig. The setscrew was tightened and then loosened one quarter turn. A separate stab incision was created, and the distal interlocking screw was placed using standard AO technique. The jig was removed. Final AP and lateral fluoroscopy views were obtained to confirm fracture reduction and hardware placement. Tip apex distance was appropriate. There was no chondral penetration.  The wounds were copiously irrigated with saline. The wound was closed in layers with #1 Vicryl for the fascia, 2-0 Monocryl for the deep dermal layer, and 3-0 Monocryl subcuticular stitch. Glue was applied to the skin. Once the glue was fully hardened, sterile dressing was applied. The patient was then awakened from anesthesia and taken to the PACU in stable condition. Sponge needle and instrument counts were correct at the end of the case 2. There were no known complications.  We will readmit the patient to the hospitalist. Weightbearing status will be weightbearing as tolerated with a walker. We will begin Lovenox for DVT prophylaxis. The patient will work with physical therapy and undergo disposition planning.

## 2018-06-30 NOTE — Progress Notes (Signed)
CSW acknowledges SNF consult. The patient has pending PT/OT evaluations. CSW will continue to follow and assist with disposition planning based of PT/OT recommendations.   CSW will continue to follow.   Domenic Schwab, MSW, [place]

## 2018-06-30 NOTE — Discharge Instructions (Signed)
 Dr. Kippy Gohman Adult Hip & Knee Specialist [facility] [address] [place], [place] [address] [phone]   POSTOPERATIVE DIRECTIONS    Hip Rehabilitation, Guidelines Following Surgery   WEIGHT BEARING Weight bearing as tolerated with assist device (walker, cane, etc) as directed, use it as long as suggested by your surgeon or therapist, typically at least 4-6 weeks.   HOME CARE INSTRUCTIONS  Remove items at home which could result in a fall. This includes throw rugs or furniture in walking pathways.  Continue medications as instructed at time of discharge.  You may have some home medications which will be placed on hold until you complete the course of blood thinner medication.  4 days after discharge, you may start showering. No tub baths or soaking your incisions. Do not put on socks or shoes without following the instructions of your caregivers.   Sit on chairs with arms. Use the chair arms to help push yourself up when arising.  Arrange for the use of a toilet seat elevator so you are not sitting low.   Walk with walker as instructed.  You may resume a sexual relationship in one month or when given the OK by your caregiver.  Use walker as long as suggested by your caregivers.  Avoid periods of inactivity such as sitting longer than an hour when not asleep. This helps prevent blood clots.  You may return to work once you are cleared by your surgeon.  Do not drive a car for 6 weeks or until released by your surgeon.  Do not drive while taking narcotics.  Wear elastic stockings for two weeks following surgery during the day but you may remove then at night.  Make sure you keep all of your appointments after your operation with all of your doctors and caregivers. You should call the office at the above phone number and make an appointment for approximately two weeks after the date of your surgery. Please pick up a stool softener and laxative  for home use as long as you are requiring pain medications.  ICE to the affected hip every three hours for 30 minutes at a time and then as needed for pain and swelling. Continue to use ice on the hip for pain and swelling from surgery. You may notice swelling that will progress down to the foot and ankle.  This is normal after surgery.  Elevate the leg when you are not up walking on it.   It is important for you to complete the blood thinner medication as prescribed by your doctor.  Continue to use the breathing machine which will help keep your temperature down.  It is common for your temperature to cycle up and down following surgery, especially at night when you are not up moving around and exerting yourself.  The breathing machine keeps your lungs expanded and your temperature down.  RANGE OF MOTION AND STRENGTHENING EXERCISES  These exercises are designed to help you keep full movement of your hip joint. Follow your caregiver's or physical therapist's instructions. Perform all exercises about fifteen times, three times per day or as directed. Exercise both hips, even if you have had only one joint replacement. These exercises can be done on a training (exercise) mat, on the floor, on a table or on a bed. Use whatever works the best and is most comfortable for you. Use music or television while you are exercising so that the exercises are a pleasant break in your day. This   will make your life better with the exercises acting as a break in routine you can look forward to.  Lying on your back, slowly slide your foot toward your buttocks, raising your knee up off the floor. Then slowly slide your foot back down until your leg is straight again.  Lying on your back spread your legs as far apart as you can without causing discomfort.  Lying on your side, raise your upper leg and foot straight up from the floor as far as is comfortable. Slowly lower the leg and repeat.  Lying on your back, tighten up the  muscle in the front of your thigh (quadriceps muscles). You can do this by keeping your leg straight and trying to raise your heel off the floor. This helps strengthen the largest muscle supporting your knee.  Lying on your back, tighten up the muscles of your buttocks both with the legs straight and with the knee bent at a comfortable angle while keeping your heel on the floor.   SKILLED REHAB INSTRUCTIONS: If the patient is transferred to a skilled rehab facility following release from the hospital, a list of the current medications will be sent to the facility for the patient to continue.  When discharged from the skilled rehab facility, please have the facility set up the patient's Home Health Physical Therapy prior to being released. Also, the skilled facility will be responsible for providing the patient with their medications at time of release from the facility to include their pain medication and their blood thinner medication. If the patient is still at the rehab facility at time of the two week follow up appointment, the skilled rehab facility will also need to assist the patient in arranging follow up appointment in our office and any transportation needs.  MAKE SURE YOU:  Understand these instructions.  Will watch your condition.  Will get help right away if you are not doing well or get worse.  Pick up stool softner and laxative for home use following surgery while on pain medications. Daily dry dressing changes as needed. In 4 days, you may remove your dressings and begin taking showers - no tub baths or soaking the incisions. Continue to use ice for pain and swelling after surgery. Do not use any lotions or creams on the incision until instructed by your surgeon.   

## 2018-06-30 NOTE — Plan of Care (Signed)
  Problem: Coping: Goal: Level of anxiety will decrease Outcome: Progressing   Problem: Elimination: Goal: Will not experience complications related to urinary retention Outcome: Progressing   Problem: Pain Managment: Goal: General experience of comfort will improve Outcome: Progressing   

## 2018-06-30 NOTE — Consult Note (Signed)
ORTHOPAEDIC CONSULTATION  REQUESTING PHYSICIAN: Swayze, Ava, DO  PCP:  Patient, No Pcp Per  Chief Complaint: Left hip injury  HPI: Christine Jennings is a 85 y.o. female who complains of left hip pain after a ground-level fall last night.  She landed on her left hip.  She has left hip pain and inability to weight-bear.  She was brought to the emergency department where x-rays revealed a displaced left intertrochanteric femur fracture.  She was admitted by the hospitalist service for perioperative or stratification and medical optimization.  Patient is known to me for previous right intertrochanteric femur fracture treated last summer.  She denies other injuries.  Past Medical History:  Diagnosis Date  . Osteoporosis    Past Surgical History:  Procedure Laterality Date  . ABDOMINAL SURGERY     Cyst Removal   . INTRAMEDULLARY (IM) NAIL INTERTROCHANTERIC Right 06/25/2017   Procedure: INTRAMEDULLARY (IM) NAIL INTERTROCHANTRIC;  Surgeon: Rod Can, MD;  Location: [facility];  Service: Orthopedics;  Laterality: Right;  . Left Breast Surgery Left    Social History   Socioeconomic History  . Marital status: Single    Spouse name: Not on file  . Number of children: Not on file  . Years of education: Not on file  . Highest education level: Not on file  Occupational History  . Not on file  Social Needs  . Financial resource strain: Not on file  . Food insecurity    Worry: Not on file    Inability: Not on file  . Transportation needs    Medical: Not on file    Non-medical: Not on file  Tobacco Use  . Smoking status: Never Smoker  . Smokeless tobacco: Never Used  Substance and Sexual Activity  . Alcohol use: Never    Frequency: Never  . Drug use: Never  . Sexual activity: Not Currently  Lifestyle  . Physical activity    Days per week: Not on file    Minutes per session: Not on file  . Stress: Not on file  Relationships  . Social Herbalist on phone: Not on  file    Gets together: Not on file    Attends religious service: Not on file    Active member of club or organization: Not on file    Attends meetings of clubs or organizations: Not on file    Relationship status: Not on file  Other Topics Concern  . Not on file  Social History Narrative  . Not on file   Family History  Problem Relation Age of Onset  . Heart disease Father    No Known Allergies Prior to Admission medications   Medication Sig Start Date End Date Taking? Authorizing Provider  alendronate (FOSAMAX) 70 MG tablet Take 70 mg by mouth every 7 (seven) days. 03/28/18  Yes [provider]  Ascorbic Acid (VITAMIN C PO) Take 1 tablet by mouth daily.   Yes [provider]  Cholecalciferol (VITAMIN D PO) Take 1 tablet by mouth daily.   Yes [provider]  Multiple Vitamin (MULTIVITAMIN) tablet Take 1 tablet by mouth daily.   Yes [provider]  docusate sodium (COLACE) 100 MG capsule Take 1 capsule (100 mg total) by mouth 2 (two) times daily. Patient not taking: Reported on 06/29/2018 06/28/17   Aline August, MD  enoxaparin (LOVENOX) 40 MG/0.4ML injection Inject 0.4 mLs (40 mg total) into the skin daily. Patient not taking: Reported on 06/29/2018 06/28/17  Doreather Hoxworth, Arlys JohnBrian, MD  feeding supplement, ENSURE ENLIVE, (ENSURE ENLIVE) LIQD Take 237 mLs by mouth 2 (two) times daily between meals. Patient not taking: Reported on 06/29/2018 06/28/17   Glade LloydAlekh, Kshitiz, MD  HYDROcodone-acetaminophen (NORCO/VICODIN) 5-325 MG tablet Take 1-2 tablets by mouth every 6 (six) hours as needed (prn hip pain). Patient not taking: Reported on 06/29/2018 06/27/17   Samson FredericSwinteck, Anacleto Batterman, MD  ketoconazole (NIZORAL) 2 % cream Apply 1 fingertip amount to each foot daily. Patient not taking: Reported on 06/29/2018 09/01/17   Park LiterPrice, Michael J, DPM  Multiple Vitamin (MULTIVITAMIN WITH MINERALS) TABS tablet Take 1 tablet by mouth daily. Patient not taking: Reported on 06/29/2018 06/29/17    Glade LloydAlekh, Kshitiz, MD  polyethylene glycol (MIRALAX / GLYCOLAX) packet Take 17 g by mouth daily as needed for mild constipation. Patient not taking: Reported on 06/29/2018 06/28/17   Glade LloydAlekh, Kshitiz, MD   Dg Chest 1 View  Result Date: 06/29/2018 CLINICAL DATA:  Pain status post fall EXAM: CHEST  1 VIEW COMPARISON:  June 25, 2017 FINDINGS: Chronic lung changes are noted bilaterally consistent with emphysema. The lungs are hyperexpanded. The heart is mildly enlarged but stable from prior study. There is a stable rim calcified structure in the left upper quadrant of the abdomen. Aortic calcifications are noted. There appear to be some calcified hilar and mediastinal lymph nodes bilaterally. IMPRESSION: 1. No definite acute cardiopulmonary process. 2. Chronic lung changes bilaterally with somewhat hyperexpanded lungs which can be seen in patients with COPD. 3. Stable peripherally calcified structure in the left upper quadrant of unknown clinical significance. This could represent a partially calcified aneurysm in the appropriate clinical setting. Consider an outpatient nonemergent follow-up CT for further evaluation of this finding. Electronically Signed   By: Katherine Mantlehristopher  Green M.D.   On: 06/29/2018 19:50   Ct Head Wo Contrast  Result Date: 06/29/2018 CLINICAL DATA:  Fall EXAM: CT HEAD WITHOUT CONTRAST TECHNIQUE: Contiguous axial images were obtained from the base of the skull through the vertex without intravenous contrast. COMPARISON:  None. FINDINGS: Brain: There is no mass, hemorrhage or extra-axial collection. There is generalized atrophy without lobar predilection. Hypodensity of the white matter is most commonly associated with chronic microvascular disease. Vascular: Atherosclerotic calcification of the internal carotid arteries at the skull base. No abnormal hyperdensity of the major intracranial arteries or dural venous sinuses. Skull: The visualized skull base, calvarium and extracranial soft tissues are  normal. Sinuses/Orbits: No fluid levels or advanced mucosal thickening of the visualized paranasal sinuses. No mastoid or middle ear effusion. The orbits are normal. IMPRESSION: Chronic small vessel ischemia and generalized volume loss without acute intracranial abnormality. Electronically Signed   By: Deatra RobinsonKevin  Herman M.D.   On: 06/29/2018 19:53   Dg Hip Unilat With Pelvis 2-3 Views Left  Result Date: 06/29/2018 CLINICAL DATA:  Fall EXAM: DG HIP (WITH OR WITHOUT PELVIS) 2-3V LEFT COMPARISON:  06/25/2017 FINDINGS: Prior intramedullary rodding of the right femur for old intertrochanteric fracture. Acute left intertrochanteric fracture with mild displacement and varus angulation of distal fracture fragment. Left femoral head projects in joint. IMPRESSION: 1. Acute left intertrochanteric fracture. 2. Prior intramedullary rodding of right intertrochanteric fracture Electronically Signed   By: Jasmine PangKim  Fujinaga M.D.   On: 06/29/2018 19:33    Positive ROS: All other systems have been reviewed and were otherwise negative with the exception of those mentioned in the HPI and as above.  Physical Exam: General: Alert, no acute distress Cardiovascular: No pedal edema Respiratory: No cyanosis, no use of  accessory musculature GI: No organomegaly, abdomen is soft and non-tender Skin: No lesions in the area of chief complaint Neurologic: Sensation intact distally Psychiatric: Patient is competent for consent with normal mood and affect Lymphatic: No axillary or cervical lymphadenopathy  MUSCULOSKELETAL: Examination of the left hip reveals no skin wounds or lesions.  She is shortened and externally rotated.  She is neurovascularly intact distally.  She does have bilateral lower extremity edema, mild.  Assessment: Displaced left intertrochanteric femur fracture. Osteoporosis.  Plan: I discussed the findings with the patient.  Her injury will require intramedullary fixation of the left femur.  We discussed the risk,  benefits, and alternatives.  Please see statement of risk.  Plan for surgery today.  Patient will need to hold Fosamax for 6 months.  All questions were solicited and answered.  The risks, benefits, and alternatives were discussed with the patient. There are risks associated with the surgery including, but not limited to, problems with anesthesia (death), infection, differences in leg length/angulation/rotation, fracture of bones, loosening or failure of implants, malunion, nonunion, hematoma (blood accumulation) which may require surgical drainage, blood clots, pulmonary embolism, nerve injury (foot drop), and blood vessel injury. The patient understands these risks and elects to proceed.   Jonette PesaBrian J Rashid Whitenight, MD Cell [phone]    06/30/2018 8:33 AM

## 2018-06-30 NOTE — Anesthesia Preprocedure Evaluation (Addendum)
Anesthesia Evaluation  Patient identified by MRN, date of birth, ID band Patient awake    Reviewed: Allergy & Precautions, NPO status , Patient's Chart, lab work & pertinent test results  Airway Mallampati: II  TM Distance: >3 FB Neck ROM: Full    Dental  (+) Teeth Intact, Dental Advisory Given   Pulmonary    breath sounds clear to auscultation       Cardiovascular  Rhythm:Regular Rate:Normal     Neuro/Psych    GI/Hepatic   Endo/Other    Renal/GU      Musculoskeletal   Abdominal   Peds  Hematology   Anesthesia Other Findings   Reproductive/Obstetrics                             Anesthesia Physical  Anesthesia Plan  ASA: III  Anesthesia Plan: Spinal   Post-op Pain Management:    Induction:   PONV Risk Score and Plan: Ondansetron and Propofol infusion  Airway Management Planned: Natural Airway and Simple Face Mask  Additional Equipment:   Intra-op Plan:   Post-operative Plan:   Informed Consent: I have reviewed the patients History and Physical, chart, labs and discussed the procedure including the risks, benefits and alternatives for the proposed anesthesia with the patient or authorized representative who has indicated his/her understanding and acceptance.     Dental advisory given  Plan Discussed with: CRNA and Anesthesiologist  Anesthesia Plan Comments:         Anesthesia Quick Evaluation  

## 2018-06-30 NOTE — Progress Notes (Signed)
   06/30/18 1000  Clinical Encounter Type  Visited With Other (Comment) (consult with community clergy)  Referral From Patient's clergy  Consult/Referral To Chaplain  Recommendations assessment and support around injury / life change  Stress Factors  Patient Stress Factors Health changes;Major life changes

## 2018-06-30 NOTE — Progress Notes (Signed)
This Chaplain received referral from pt's community clergy.  Pt is member at [employer].  Referred to [facility] primary chaplain for support.    [employer], Gildardo Griffes, reports pt has lived independently.  Had significant fall last year, and has been recovering and regaining independence.  This fall is setback for patient, who values independence and is facing life change.       Jerene Pitch, MDiv, [facility]

## 2018-06-30 NOTE — Anesthesia Postprocedure Evaluation (Signed)
Anesthesia Post Note  Patient: RELLA EGELSTON  Procedure(s) Performed: INTRAMEDULLARY (IM) NAIL INTERTROCHANTRIC (Left Hip)     Patient location during evaluation: PACU Anesthesia Type: Spinal Level of consciousness: oriented and awake and alert Pain management: pain level controlled Vital Signs Assessment: post-procedure vital signs reviewed and stable Respiratory status: spontaneous breathing, respiratory function stable and patient connected to nasal cannula oxygen Cardiovascular status: blood pressure returned to baseline and stable Postop Assessment: no headache, no backache and no apparent nausea or vomiting Anesthetic complications: no    Last Vitals:  Vitals:   06/30/18 1115 06/30/18 1226  BP: (!) 125/49 134/70  Pulse: 93 89  Resp: 20 20  Temp: 36.7 C 36.7 C  SpO2: 99% 100%    Last Pain:  Vitals:   06/30/18 1303  TempSrc:   PainSc: 7                  Ronae Noell COKER

## 2018-07-01 LAB — BASIC METABOLIC PANEL
Anion gap: 6 (ref 5–15)
BUN: 16 mg/dL (ref 8–23)
CO2: 27 mmol/L (ref 22–32)
Calcium: 7.9 mg/dL — ABNORMAL LOW (ref 8.9–10.3)
Chloride: 106 mmol/L (ref 98–111)
Creatinine, Ser: 0.7 mg/dL (ref 0.44–1.00)
GFR calc Af Amer: >60 mL/min (ref >60)
GFR calc non Af Amer: >60 mL/min (ref >60)
Glucose, Bld: 113 mg/dL — ABNORMAL HIGH (ref 70–99)
Potassium: 4.3 mmol/L (ref 3.5–5.1)
Sodium: 139 mmol/L (ref 135–145)

## 2018-07-01 LAB — CBC
HCT: 26.7 % — ABNORMAL LOW (ref 36.0–46.0)
Hemoglobin: 8.8 g/dL — ABNORMAL LOW (ref 12.0–15.0)
MCH: 32 pg (ref 26.0–34.0)
MCHC: 33 g/dL (ref 30.0–36.0)
MCV: 97.1 fL (ref 80.0–100.0)
Platelets: 110 10*3/uL — ABNORMAL LOW (ref 150–400)
RBC: 2.75 MIL/uL — ABNORMAL LOW (ref 3.87–5.11)
RDW: 12.3 % (ref 11.5–15.5)
WBC: 8.6 10*3/uL (ref 4.0–10.5)
nRBC: 0 % (ref 0.0–0.2)

## 2018-07-01 MED ORDER — ASPIRIN 81 MG PO CHEW: 81.0000 mg | CHEWABLE_TABLET | Freq: Two times a day (BID) | ORAL | 1 refills | Status: AC

## 2018-07-01 MED ORDER — HYDROCODONE-ACETAMINOPHEN 5-325 MG PO TABS: 1.0000 | ORAL_TABLET | ORAL | 0 refills | Status: DC | PRN

## 2018-07-01 NOTE — Evaluation (Signed)
Physical Therapy Evaluation Patient Details Name: Christine Jennings MRN: [id number] DOB: 1927-09-26 Today's Date: 07/01/2018   History of Present Illness  Pt is an 85 y.o. female admitted 06/29/18 after fall sustaining L intertrochanteric fx. S/p L femoral IM nail 6/26. PMH includes osteoporosis, s/p R femoral IM nail after fall in 06/2017.    Clinical Impression  Pt presents with an overall decrease in functional mobility secondary to above. PTA, pt lives alone and ambulatory with intermittent use of RW; has PCA assist for household tasks and ADLs as needed. Educ on precautions, positioning, and importance of mobility. Today, pt able to initiate standing with RW and maxA; modA to weight shift but unable to take complete step. Pt would benefit from continued acute PT services to maximize functional mobility and independence prior to d/c with SNF-level therapies. Pt agreeable as she had similar injury to opposite hip exactly one year ago with [facility].     Follow Up Recommendations SNF;Supervision for mobility/OOB    Equipment Recommendations  (TBD)    Recommendations for Other Services       Precautions / Restrictions Precautions Precautions: Fall Restrictions Weight Bearing Restrictions: Yes LLE Weight Bearing: Weight bearing as tolerated      Mobility  Bed Mobility Overal bed mobility: Needs Assistance Bed Mobility: Supine to Sit;Sit to Supine     Supine to sit: Max assist;HOB elevated Sit to supine: Max assist;+2 for physical assistance   General bed mobility comments: MaxA to assist trunk elevation and scoot hips to EOB. Pt frequently stating, "I can't" requiring frequent encouragement and verbal cues for task. MaxA+2 to return to supine, pt limited by fatigue and assisting very little  Transfers Overall transfer level: Needs assistance Equipment used: Rolling walker (2 wheeled) Transfers: Sit to/from Stand Sit to Stand: Max assist;From elevated surface          General transfer comment: MaxA to assist trunk elevation and cue upright posture/hip extension, heavy reliance on UE support; assist to steady RW. Fearful of falling  Ambulation/Gait Ambulation/Gait assistance: Mod assist   Assistive device: Rolling walker (2 wheeled)       General Gait Details: Attempted side steps at EOB with RW, pt able to minimally scoot each foot with modA for weight shifts and to prevent LLE buckling, but unable to take complete steps  Stairs            Wheelchair Mobility    Modified Rankin (Stroke Patients Only)       Balance Overall balance assessment: Needs assistance   Sitting balance-Leahy Scale: Fair       Standing balance-Leahy Scale: Poor Standing balance comment: Reliant on UE support and external assist to maintain standing balance                             Pertinent Vitals/Pain Pain Assessment: Faces Faces Pain Scale: Hurts whole lot Pain Location: LLE Pain Descriptors / Indicators: Moaning;Guarding;Grimacing Pain Intervention(s): Monitored during session;Limited activity within patient's tolerance;Patient requesting pain meds-RN notified    Home Living Family/patient expects to be discharged to:: Skilled nursing facility                 Additional Comments: Lives alone    Prior Function Level of Independence: Needs assistance   Gait / Transfers Assistance Needed: Ambulatory with and without RW  ADL's / Homemaking Assistance Needed: PCA assists with household tasks and driving  Comments: Pt had same  injury to opposite hip 1 year ago (06/2017) with d/c to [facility] SN[facility]     Hand Dominance        Extremity/Trunk Assessment   Upper Extremity Assessment Upper Extremity Assessment: Generalized weakness    Lower Extremity Assessment Lower Extremity Assessment: Generalized weakness;LLE deficits/detail LLE Deficits / Details: s/p L femoral IM nail; functionally <3/5 throughout, limited by  pain LLE: Unable to fully assess due to pain LLE Coordination: decreased gross motor;decreased fine motor    Cervical / Trunk Assessment Cervical / Trunk Assessment: Kyphotic  Communication   Communication: HOH  Cognition Arousal/Alertness: Awake/alert Behavior During Therapy: WFL for tasks assessed/performed Overall Cognitive Status: No family/caregiver present to determine baseline cognitive functioning Area of Impairment: Following commands;Awareness;Problem solving;Attention                   Current Attention Level: Selective   Following Commands: Follows one step commands with increased time   Awareness: Emergent Problem Solving: Slow processing;Requires verbal cues General Comments: Very distracted by pain      General Comments General comments (skin integrity, edema, etc.): SpO2 97% on 2L O2 [place] at beginning of session (pt does not wear O2 baseline); down to 71% on RA with mobility, returning to 91% on 2L O2 [place]    Exercises     Assessment/Plan    PT Assessment Patient needs continued PT services  PT Problem List Decreased strength;Decreased range of motion;Decreased activity tolerance;Decreased balance;Decreased mobility;Decreased cognition;Decreased knowledge of use of DME;Decreased knowledge of precautions;Pain       PT Treatment Interventions DME instruction;Gait training;Stair training;Functional mobility training;Therapeutic activities;Therapeutic exercise;Balance training;Patient/family education;Wheelchair mobility training    PT Goals (Current goals can be found in the Care Plan section)  Acute Rehab PT Goals Patient Stated Goal: Less pain; agreeable to SNF PT Goal Formulation: With patient Time For Goal Achievement: 07/15/18 Potential to Achieve Goals: Good    Frequency Min 3X/week   Barriers to discharge Decreased caregiver support      Co-evaluation               AM-PAC PT "6 Clicks" Mobility  Outcome Measure Help needed turning  from your back to your side while in a flat bed without using bedrails?: A Lot Help needed moving from lying on your back to sitting on the side of a flat bed without using bedrails?: A Lot Help needed moving to and from a bed to a chair (including a wheelchair)?: A Lot Help needed standing up from a chair using your arms (e.g., wheelchair or bedside chair)?: A Lot Help needed to walk in hospital room?: A Lot Help needed climbing 3-5 steps with a railing? : Total 6 Click Score: 11    End of Session Equipment Utilized During Treatment: Gait belt Activity Tolerance: Patient tolerated treatment well;Patient limited by pain Patient left: in bed;with call bell/phone within reach;with bed alarm set;with nursing/sitter in room Nurse Communication: Mobility status;Patient requests pain meds PT Visit Diagnosis: Other abnormalities of gait and mobility (R26.89);Muscle weakness (generalized) (M62.81);Pain Pain - Right/Left: Left Pain - part of body: Hip;Leg    Time: 0938-1829 PT Time Calculation (min) (ACUTE ONLY): 31 min   Charges:   PT Evaluation $PT Eval Moderate Complexity: 1 Mod PT Treatments $Therapeutic Activity: 8-22 mins   Mabeline Caras, PT, DPT Acute Rehabilitation Services  Pager [phone] Office [phone]  Derry Lory 07/01/2018, 5:28 PM

## 2018-07-01 NOTE — Progress Notes (Signed)
    Subjective:  Patient reports pain as mild to moderate.  Denies N/V/CP/SOB. No c/o.  Objective:   VITALS:   Vitals:   07/01/18 0014 07/01/18 0423 07/01/18 0423 07/01/18 0755  BP: (!) 136/58 (!) 126/57 (!) 126/57 (!) 143/71  Pulse: 72 88 88 85  Resp: 16 16 16    Temp: 98.5 F (36.9 C) 99 F (37.2 C) 99 F (37.2 C) 98 F (36.7 C)  TempSrc: Oral Oral Oral Oral  SpO2: 99% 97% 97% 100%  Weight:  53.7 kg    Height:        NAD ABD soft Sensation intact distally Intact pulses distally Dorsiflexion/Plantar flexion intact Incision: dressing C/D/I Compartment soft   Lab Results  Component Value Date   WBC 8.6 07/01/2018   HGB 8.8 (L) 07/01/2018   HCT 26.7 (L) 07/01/2018   MCV 97.1 07/01/2018   PLT 110 (L) 07/01/2018   BMET    Component Value Date/Time   NA 139 07/01/2018 0312   K 4.3 07/01/2018 0312   CL 106 07/01/2018 0312   CO2 27 07/01/2018 0312   GLUCOSE 113 (H) 07/01/2018 0312   BUN 16 07/01/2018 0312   CREATININE 0.70 07/01/2018 0312   CALCIUM 7.9 (L) 07/01/2018 0312   GFRNONAA >60 07/01/2018 0312   GFRAA >60 07/01/2018 6378     Assessment/Plan: 1 Day Post-Op   Principal Problem:   Intertrochanteric fracture of left hip (HCC) Active Problems:   Anemia   Hyperglycemia   Closed comminuted intertrochanteric fracture of left femur ([place])   WBAT with walker DVT ppx: Lovenox in house --> d/c on ASA 81 mg PO BID x 6 weeks, SCDs, TEDS PO pain control PT/OT Dispo: D/C planning   Christine Jennings 07/01/2018, 9:00 AM   Rod Can, MD Cell: [phone] [place] is now [facility]  [facility] [address], [place], [place] [address] Phone: [phone] [email or web address] Facebook  [employer]

## 2018-07-01 NOTE — Progress Notes (Signed)
PROGRESS NOTE  [name] GLO:[id number] DOB: [birth year] DOA: 06/29/2018 PCP: Patient, No Pcp Per  Brief History   Christine Jennings  is a 85 y.o. female, w h/o R intertrochanteric fracture  Who had a mechanical fall.  Pt c/o left hip pain and brought to ER for evaluation. She was found to have suffered a left intertrochanteric fracture.   [facility] were consulted to admit the patient for further evaluation and treatment. Orthopedic surgery was consulted to address the hip fracture. The patient went to the OR this morning for intramedullary fixation of the left femur.  Consultants  . Orthopedic surgery  Procedures  . Intramedullary fixation of the left femur.  Antibiotics   Anti-infectives (From admission, onward)   Start     Dose/Rate Route Frequency Ordered Stop   06/30/18 1530  ceFAZolin (ANCEF) IVPB 2g/100 mL premix     2 g 200 mL/hr over 30 Minutes Intravenous Every 6 hours 06/30/18 1208 06/30/18 2334   06/30/18 0830  ceFAZolin (ANCEF) IVPB 2g/100 mL premix     2 g 200 mL/hr over 30 Minutes Intravenous On call to O.R. 06/30/18 0827 06/30/18 1000   06/30/18 0715  ceFAZolin (ANCEF) 2-4 GM/100ML-% IVPB    Note to Pharmacy: Luciana Axe   : cabinet override      06/30/18 0715 06/30/18 0930      Subjective  The patient is resting comfortably. She is very hard of hearing. No new complaints.  Objective   Vitals:  Vitals:   07/01/18 0755 07/01/18 1359  BP: (!) 143/71 (!) 109/49  Pulse: 85 84  Resp:    Temp: 98 F (36.7 C) 98.1 F (36.7 C)  SpO2: 100% 98%   Exam:  Constitutional:  . The patient is awake and alert. No acute distress. Respiratory:  . No increased work of breathing. . No wheezes, rales, or rhonchi. No tactile fremitus. . No tactile fremitus Cardiovascular:  . Regular rate and rhythm . No murmurs, ectopy, or gallups. . No lateral PMI. No thrills. Abdomen:  . Abdomen is soft, non-tender, non-distended. . No hernias, masses, or  organomegaly. . Hypoactive bowel sounds.  Musculoskeletal:  . No cyanosis, clubbing, or edema. Kermit Balo capillary refill. Skin:  . No rashes, lesions, ulcers . palpation of skin: no induration or nodules Neurologic:  . I am unable to evaluate due to the patient's inability to cooperate with exam. Psychiatric:  I am unable to evaluate due to the patient's inability to cooperate with exam.   I have personally reviewed the following:   Today's Data  . Vitals, CBC   Scheduled Meds: . docusate sodium  100 mg Oral BID  . enoxaparin (LOVENOX) injection  40 mg Subcutaneous Q24H  . senna  1 tablet Oral BID   Continuous Infusions:   Principal Problem:   Intertrochanteric fracture of left hip (HCC) Active Problems:   Anemia   Hyperglycemia   Closed comminuted intertrochanteric fracture of left femur (HCC)   LOS: 2 days   A & P  L hip intertrochanteric fracture: As per orthopedic surgery. S/P intramedullary fixation. PT/OT/ social work consults.  Anemia:Check ferritin, iron, tibc, b12, folate. Hemoglobin is stable.  Hyperglycemia: Check hga1c, lipid. If Hga1c >6.5, please start on SSI  Osteoporosis: Hold Fosamax for now. Check vitamin D level.  DVT Prophylaxis   Lovenox. Family Communication: No family at bedside. Code Status:  FULL CODE  Demetrie Borge, DO [facility] Direct contact: see [email or web address]  7PM-7AM contact night coverage as above  07/01/2018, 4:21 PM  LOS: 1 day

## 2018-07-02 LAB — BASIC METABOLIC PANEL
Anion gap: 6 (ref 5–15)
BUN: 17 mg/dL (ref 8–23)
CO2: 27 mmol/L (ref 22–32)
Calcium: 8 mg/dL — ABNORMAL LOW (ref 8.9–10.3)
Chloride: 103 mmol/L (ref 98–111)
Creatinine, Ser: 0.69 mg/dL (ref 0.44–1.00)
GFR calc Af Amer: >60 mL/min (ref >60)
GFR calc non Af Amer: >60 mL/min (ref >60)
Glucose, Bld: 114 mg/dL — ABNORMAL HIGH (ref 70–99)
Potassium: 4 mmol/L (ref 3.5–5.1)
Sodium: 136 mmol/L (ref 135–145)

## 2018-07-02 LAB — CBC
HCT: 25.8 % — ABNORMAL LOW (ref 36.0–46.0)
Hemoglobin: 8.5 g/dL — ABNORMAL LOW (ref 12.0–15.0)
MCH: 32 pg (ref 26.0–34.0)
MCHC: 32.9 g/dL (ref 30.0–36.0)
MCV: 97 fL (ref 80.0–100.0)
Platelets: 110 10*3/uL — ABNORMAL LOW (ref 150–400)
RBC: 2.66 MIL/uL — ABNORMAL LOW (ref 3.87–5.11)
RDW: 12.1 % (ref 11.5–15.5)
WBC: 9.5 10*3/uL (ref 4.0–10.5)
nRBC: 0 % (ref 0.0–0.2)

## 2018-07-02 NOTE — Progress Notes (Signed)
Patient ID: Christine Jennings, female   DOB: Jul 08, 1927, 85 y.o.   MRN: [id number] Subjective: 2 Days Post-Op Procedure(s) (LRB): INTRAMEDULLARY (IM) NAIL INTERTROCHANTRIC (Left)    Patient reports pain as moderate. "sore'.  OOB a little yesterday.  No walking yet   Objective:   VITALS:   Vitals:   07/02/18 0200 07/02/18 0436  BP:  (!) 156/60  Pulse:  (!) 106  Resp:  19  Temp:  97.6 F (36.4 C)  SpO2: 92% 96%    Neurovascular intact Incision: dressing C/D/I  LABS Recent Labs    06/30/18 1317 07/01/18 0312 07/02/18 0252  HGB 9.0* 8.8* 8.5*  HCT 27.7* 26.7* 25.8*  WBC 11.6* 8.6 9.5  PLT PLATELET CLUMPS NOTED ON SMEAR, UNABLE TO ESTIMATE 110* 110*    Recent Labs    06/29/18 1954 06/30/18 1317 07/01/18 0312 07/02/18 0252  NA 140  --  139 136  K 4.5  --  4.3 4.0  BUN 28*  --  16 17  CREATININE 0.66 0.70 0.70 0.69  GLUCOSE 170*  --  113* 114*    Recent Labs    06/29/18 1954  INR 1.0     Assessment/Plan: 2 Days Post-Op Procedure(s) (LRB): INTRAMEDULLARY (IM) NAIL INTERTROCHANTRIC (Left)   Up with therapy  Disposition pending increased activity and assessment with PT Weight bearing and DVT prophylaxis as directed

## 2018-07-02 NOTE — Progress Notes (Addendum)
PROGRESS NOTE  [name] GEZ:[id number] DOB: 01-30-1927 DOA: 06/29/2018 PCP: Patient, No Pcp Per  Brief History   Christine Jennings  is a 85 y.o. female, w h/o R intertrochanteric fracture  Who had a mechanical fall.  Pt c/o left hip pain and brought to ER for evaluation. She was found to have suffered a left intertrochanteric fracture.   [facility] were consulted to admit the patient for further evaluation and treatment. Orthopedic surgery was consulted to address the hip fracture. The patient went to the OR this morning for intramedullary fixation of the left femur.  Consultants  . Orthopedic surgery  Procedures  . Intramedullary fixation of the left femur.  Antibiotics   Anti-infectives (From admission, onward)   Start     Dose/Rate Route Frequency Ordered Stop   06/30/18 1530  ceFAZolin (ANCEF) IVPB 2g/100 mL premix     2 g 200 mL/hr over 30 Minutes Intravenous Every 6 hours 06/30/18 1208 06/30/18 2334   06/30/18 0830  ceFAZolin (ANCEF) IVPB 2g/100 mL premix     2 g 200 mL/hr over 30 Minutes Intravenous On call to O.R. 06/30/18 0827 06/30/18 1000   06/30/18 0715  ceFAZolin (ANCEF) 2-4 GM/100ML-% IVPB    Note to Pharmacy: Luciana Axe   : cabinet override      06/30/18 0715 06/30/18 0930      Subjective  The patient is resting comfortably. She is very hard of hearing. No new complaints.  Objective   Vitals:  Vitals:   07/02/18 0436 07/02/18 1342  BP: (!) 156/60 (!) 141/64  Pulse: (!) 106 90  Resp: 19 17  Temp: 97.6 F (36.4 C) 99 F (37.2 C)  SpO2: 96% 100%   Exam:  Constitutional:  . The patient is awake and alert. No acute distress. Respiratory:  . No increased work of breathing. . No wheezes, rales, or rhonchi. No tactile fremitus. . No tactile fremitus . The patient is very kyphotic Cardiovascular:  . Regular rate and rhythm . No murmurs, ectopy, or gallups. . No lateral PMI. No thrills. Abdomen:  . Abdomen is soft, non-tender,  non-distended. . No hernias, masses, or organomegaly. . Hypoactive bowel sounds.  Musculoskeletal:  . No cyanosis, clubbing, or edema. Kermit Balo capillary refill. Skin:  . No rashes, lesions, ulcers . palpation of skin: no induration or nodules Neurologic:  . I am unable to evaluate due to the patient's inability to cooperate with exam. Psychiatric:  I am unable to evaluate due to the patient's inability to cooperate with exam.   I have personally reviewed the following:   Today's Data  . Vitals, CBC   Scheduled Meds: . docusate sodium  100 mg Oral BID  . enoxaparin (LOVENOX) injection  40 mg Subcutaneous Q24H  . senna  1 tablet Oral BID   Continuous Infusions:   Principal Problem:   Intertrochanteric fracture of left hip (HCC) Active Problems:   Anemia   Hyperglycemia   Closed comminuted intertrochanteric fracture of left femur (HCC)   LOS: 3 days   A & P  L hip intertrochanteric fracture: As per orthopedic surgery. S/P intramedullary fixation. PT/OT/ social work consults.  Anemia:Check ferritin, iron, tibc, b12, folate. Hemoglobin is stable.  Hyperglycemia: Check hga1c, lipid. If Hga1c >6.5, please start on SSI  Osteoporosis: Hold Fosamax for now. Check vitamin D level.  I have seen and examined this patient myself. I have spent 28 minutes in her evaluation and care.  DVT Prophylaxis   Lovenox. Family Communication:  No family at bedside. Code Status:  FULL CODE  Jaxzen Vanhorn, DO [facility] Direct contact: see [email or web address]  7PM-7AM contact night coverage as above 07/02/2018, 4:09 PM  LOS: 1 day

## 2018-07-02 NOTE — Plan of Care (Signed)
  Problem: Elimination: Goal: Will not experience complications related to urinary retention Outcome: Progressing   Problem: Pain Managment: Goal: General experience of comfort will improve Outcome: Progressing   Problem: Skin Integrity: Goal: Risk for impaired skin integrity will decrease Outcome: Progressing   

## 2018-07-02 NOTE — TOC Initial Note (Addendum)
Transition of Care Christine Jennings) - Initial/Assessment Note    Patient Details  Name: Christine Jennings MRN: [id number] Date of Birth: 05-03-1927  Transition of Care Christine Jennings) CM/SW Contact:    Christine Jennings, [place] Phone Number: [phone] 07/02/2018, 10:21 AM  Clinical Narrative:                  CSW consulted with patient's son Christine Jennings to inform of PT recommendation of [facility] for short term rehab at patient's time of discharge. Christine Jennings appreciative of the update from PT eval and reports patient has been to [facility] Jennings in the past but was not happy with care given there, would like other options. Christine Jennings reports patient still makes her own decisions and he is not POA yet, and requests CSW speak with patient although she is very hard of hearing. Christine Jennings also requested CSW reach out to Christine Jennings [phone]) patient's case manager with [facility] with [facility] to provide any updates to. CSW met with patient at bedside to discuss SNF recommendation, she did state she did not want to go to [facility] Jennings and gave CSW permission to send out referrals to [name] in [place] area and then let her know of bed offers. She reports she lives near [name] and would like something close to where she lives. Patient fell asleep during towards end of conversation and stopped talking, just gave slight moans or nods, CSW told patient we will follow up with her tomorrow on bed offers. CSW reached out to Christine Jennings to provide update, she reported she was running and will call CSW back.    Update 12:23 Christine Jennings called CSW back said she would like to ensure patient be faxed out to [facility] in [place] [facility], and [facility] in [facility] as patient is an active member of the Christine Jennings was able to fax referrals to Christine Jennings and Christine Jennings, [place] unable to fax to Christine Jennings as difficulty reaching staff today. Weekday CSW to follow up.   Expected Discharge Plan: Skilled  Nursing Facility Barriers to Discharge: Continued Medical Work up   Patient Goals and CMS Choice Patient states their goals for this hospitalization and ongoing recovery are:: to go to rehab then home CMS Medicare.gov Compare Post Acute Care list provided to:: Patient Choice offered to / list presented to : Patient  Expected Discharge Plan and Services Expected Discharge Plan: Christine Jennings Choice: Christine Jennings arrangements for the past 2 months: Single Family Home                                      Prior Living Arrangements/Services Living arrangements for the past 2 months: Single Family Home Lives with:: Self Patient language and need for interpreter reviewed:: Yes Do you feel safe going back to the place where you live?: No   needs short term rehab before returing home  Need for Family Participation in Patient Care: Yes (Comment) Care giver support system in place?: Yes (comment) Current home services: Homehealth aide, Other (comment)(case manager with [facility] with [facility]) Criminal Activity/Legal Involvement Pertinent to Current Situation/Hospitalization: No - Comment as needed  Activities of Daily Living Home Assistive Devices/Equipment: None ADL Screening (condition at time of admission) Patient's cognitive ability adequate to safely complete daily activities?: Yes Is the patient deaf or have difficulty hearing?: Yes Does the patient have  difficulty seeing, even when wearing glasses/contacts?: No Does the patient have difficulty concentrating, remembering, or making decisions?: No Patient able to express need for assistance with ADLs?: Yes Does the patient have difficulty dressing or bathing?: Yes Independently performs ADLs?: No Communication: Independent Dressing (OT): Needs assistance Is this a change from baseline?: Change from baseline, expected to last >3 days Grooming: Independent Feeding:  Independent Bathing: Needs assistance Is this a change from baseline?: Change from baseline, expected to last >3 days Toileting: Needs assistance Is this a change from baseline?: Change from baseline, expected to last >3days In/Out Bed: Needs assistance Is this a change from baseline?: Change from baseline, expected to last >3 days Walks in Home: Independent Does the patient have difficulty walking or climbing stairs?: Yes Weakness of Legs: Both Weakness of Arms/Hands: None  Permission Sought/Granted Permission sought to share information with : Case Manager, Family Supports, Chartered certified accountant granted to share information with : Yes, Verbal Permission Granted  Share Information with NAME: Christine Jennings  Permission granted to share info w AGENCY: SNFs  Permission granted to share info w Relationship: son  Permission granted to share info w Contact Information: [phone]  Emotional Assessment Appearance:: Appears stated age Attitude/Demeanor/Rapport: Gracious Affect (typically observed): Calm Orientation: : Oriented to Self, Oriented to Place, Oriented to  Time, Oriented to Situation Alcohol / Substance Use: Not Applicable    Admission diagnosis:  Hypoxia [R09.02] Closed fracture of left hip, initial encounter ([place]) [S72.002A] Fall, initial encounter [W19.XXXA] Patient Active Problem List   Diagnosis Date Noted  . Closed comminuted intertrochanteric fracture of left femur ([place]) 06/30/2018  . Intertrochanteric fracture of left hip ([place]) 06/29/2018  . Anemia 06/29/2018  . Hyperglycemia 06/29/2018  . Protein-calorie malnutrition, severe 06/28/2017  . Hip fracture ([place]) 06/25/2017  . Closed displaced intertrochanteric fracture of right femur ([place]) 06/25/2017   PCP:  Patient, No Pcp Per Pharmacy:   [place], [place] [address] Phone: [phone] Fax:  [phone]     Social Determinants of Health (SDOH) Interventions    Readmission Risk Interventions No flowsheet data found.

## 2018-07-02 NOTE — NC FL2 (Signed)
[place] LEVEL OF CARE SCREENING TOOL     IDENTIFICATION  Patient Name: Christine Jennings Birthdate: 1927-09-09 Sex: female Admission Date (Current Location): 06/29/2018  [facility] and [place] Number:  Herbalist and Address:  [facility], [place] [address], [place], [place] [address]      Provider Number: [id number]  Attending Physician Name and Address:  Karie Kirks, DO  Relative Name and Phone Number:  Merry Proud (son)    Current Level of Care: Hospital Recommended Level of Care: [place] Prior Approval Number:    Date Approved/Denied:   PASRR Number:    Discharge Plan: SNF    Current Diagnoses: Patient Active Problem List   Diagnosis Date Noted  . Closed comminuted intertrochanteric fracture of left femur ([place]) 06/30/2018  . Intertrochanteric fracture of left hip ([place]) 06/29/2018  . Anemia 06/29/2018  . Hyperglycemia 06/29/2018  . Protein-calorie malnutrition, severe 06/28/2017  . Hip fracture ([place]) 06/25/2017  . Closed displaced intertrochanteric fracture of right femur ([place]) 06/25/2017    Orientation RESPIRATION BLADDER Height & Weight     Self, Time, Situation, Place  O2(Nasal Cannula 2L/min) Continent, External catheter Weight: 118 lb 6.2 oz (53.7 kg) Height:  5\' 4"  (162.6 cm)  BEHAVIORAL SYMPTOMS/MOOD NEUROLOGICAL BOWEL NUTRITION STATUS      Continent Diet(see discharge summary)  AMBULATORY STATUS COMMUNICATION OF NEEDS Skin   Extensive Assist Verbally Surgical wounds(left hip closed surgical incision)                       Personal Care Assistance Level of Assistance  Bathing, Feeding, Dressing, Total care Bathing Assistance: Limited assistance Feeding assistance: Independent Dressing Assistance: Limited assistance Total Care Assistance: Maximum assistance   Functional Limitations Info  Sight, Hearing, Speech Sight Info: Adequate Hearing Info: Impaired Speech Info: Adequate    SPECIAL  CARE FACTORS FREQUENCY  PT (By licensed PT), OT (By licensed OT)     PT Frequency: min 5x weekly OT Frequency: min 5x weekly            Contractures Contractures Info: Not present    Additional Factors Info  Code Status, Allergies Code Status Info: Full Allergies Info: No Known Allergies           Current Medications (07/02/2018):  This is the current hospital active medication list Current Facility-Administered Medications  Medication Dose Route Frequency Provider Last Rate Last Dose  . acetaminophen (TYLENOL) tablet 325-650 mg  325-650 mg Oral Q6H PRN Swinteck, Aaron Edelman, MD      . docusate sodium (COLACE) capsule 100 mg  100 mg Oral BID Rod Can, MD   100 mg at 07/02/18 0941  . enoxaparin (LOVENOX) injection 40 mg  40 mg Subcutaneous Q24H Rod Can, MD   40 mg at 07/02/18 0941  . HYDROcodone-acetaminophen (NORCO) 7.5-325 MG per tablet 1-2 tablet  1-2 tablet Oral Q4H PRN Rod Can, MD   2 tablet at 07/01/18 1556  . HYDROcodone-acetaminophen (NORCO/VICODIN) 5-325 MG per tablet 1-2 tablet  1-2 tablet Oral Q4H PRN Rod Can, MD   2 tablet at 07/02/18 0435  . menthol-cetylpyridinium (CEPACOL) lozenge 3 mg  1 lozenge Oral PRN Swinteck, Aaron Edelman, MD       Or  . phenol (CHLORASEPTIC) mouth spray 1 spray  1 spray Mouth/Throat PRN Swinteck, Aaron Edelman, MD      . metoCLOPramide (REGLAN) tablet 5-10 mg  5-10 mg Oral Q8H PRN Rod Can, MD  Or  . metoCLOPramide (REGLAN) injection 5-10 mg  5-10 mg Intravenous Q8H PRN Swinteck, Arlys JohnBrian, MD      . morphine 2 MG/ML injection 0.5-1 mg  0.5-1 mg Intravenous Q2H PRN Samson FredericSwinteck, Brian, MD   0.5 mg at 07/01/18 0444  . ondansetron (ZOFRAN) tablet 4 mg  4 mg Oral Q6H PRN Swinteck, Arlys JohnBrian, MD       Or  . ondansetron (ZOFRAN) injection 4 mg  4 mg Intravenous Q6H PRN Swinteck, Arlys JohnBrian, MD      . polyethylene glycol (MIRALAX / GLYCOLAX) packet 17 g  17 g Oral Daily PRN Pearson GrippeKim, James, MD      . senna Mancel Parsons(SENOKOT) tablet 8.6 mg  1 tablet Oral  BID Samson FredericSwinteck, Brian, MD   8.6 mg at 07/02/18 [id number]     Discharge Medications: Please see discharge summary for a list of discharge medications.  Relevant Imaging Results:  Relevant Lab Results:   Additional Information SSN: [id number]  Gildardo GriffesAshley M Kawon Willcutt, LCSW

## 2018-07-02 NOTE — Plan of Care (Signed)
  Problem: Clinical Measurements: Goal: Ability to maintain clinical measurements within normal limits will improve Outcome: Progressing Goal: Will remain free from infection Outcome: Progressing   Problem: Pain Managment: Goal: General experience of comfort will improve Outcome: Progressing   Problem: Skin Integrity: Goal: Risk for impaired skin integrity will decrease Outcome: Progressing   

## 2018-07-03 ENCOUNTER — Encounter (HOSPITAL_COMMUNITY): Payer: Self-pay | Admitting: Orthopedic Surgery

## 2018-07-03 LAB — CBC
HCT: 22.8 % — ABNORMAL LOW (ref 36.0–46.0)
Hemoglobin: 7.5 g/dL — ABNORMAL LOW (ref 12.0–15.0)
MCH: 31.4 pg (ref 26.0–34.0)
MCHC: 32.9 g/dL (ref 30.0–36.0)
MCV: 95.4 fL (ref 80.0–100.0)
Platelets: 120 10*3/uL — ABNORMAL LOW (ref 150–400)
RBC: 2.39 MIL/uL — ABNORMAL LOW (ref 3.87–5.11)
RDW: 12.1 % (ref 11.5–15.5)
WBC: 6.3 10*3/uL (ref 4.0–10.5)
nRBC: 0 % (ref 0.0–0.2)

## 2018-07-03 LAB — SARS CORONAVIRUS 2 BY RT PCR (HOSPITAL ORDER, PERFORMED IN ~~LOC~~ HOSPITAL LAB): SARS Coronavirus 2: NEGATIVE

## 2018-07-03 MED ORDER — CHLORHEXIDINE GLUCONATE CLOTH 2 % EX PADS
6.0000 | MEDICATED_PAD | Freq: Every day | CUTANEOUS | Status: DC
  Administered 2018-07-03 – 2018-07-04 (×2): 6 via TOPICAL

## 2018-07-03 MED ORDER — MUPIROCIN 2 % EX OINT
1.0000 "application " | TOPICAL_OINTMENT | Freq: Two times a day (BID) | CUTANEOUS | Status: DC
  Administered 2018-07-03 – 2018-07-04 (×3): 1 via NASAL
  Filled 2018-07-03 (×2): qty 22

## 2018-07-03 NOTE — Progress Notes (Signed)
PROGRESS NOTE  Christine Jennings:[id number] DOB: 22-Oct-1927 DOA: 06/29/2018 PCP: Patient, No Pcp Per  Brief History   Christine Jennings  is a 85 y.o. female, w h/o R intertrochanteric fracture  Who had a mechanical fall.  Pt c/o left hip pain and brought to ER for evaluation. She was found to have suffered a left intertrochanteric fracture.   [facility] hospital[facility] were consulted to admit the patient for further evaluation and treatment. Orthopedic surgery was consulted to address the hip fracture. The patient went to the OR this morning for intramedullary fixation of the left femur.  Consultants  . Orthopedic surgery  Procedures  . Intramedullary fixation of the left femur.  Antibiotics   Anti-infectives (From admission, onward)   Start     Dose/Rate Route Frequency Ordered Stop   06/30/18 1530  ceFAZolin (ANCEF) IVPB 2g/100 mL premix     2 g 200 mL/hr over 30 Minutes Intravenous Every 6 hours 06/30/18 1208 06/30/18 2334   06/30/18 0830  ceFAZolin (ANCEF) IVPB 2g/100 mL premix     2 g 200 mL/hr over 30 Minutes Intravenous On call to O.R. 06/30/18 0827 06/30/18 1000   06/30/18 0715  ceFAZolin (ANCEF) 2-4 GM/100ML-% IVPB    Note to Pharmacy: Luciana Axe   : cabinet override      06/30/18 0715 06/30/18 0930      Subjective  The patient is resting comfortably. She is very hard of hearing. No new complaints.  Objective   Vitals:  Vitals:   07/03/18 0440 07/03/18 1425  BP: 127/69 (!) 128/59  Pulse: 77 74  Resp:  17  Temp: 98.6 F (37 C) 98.8 F (37.1 C)  SpO2: 98% 100%   Exam:  Constitutional:  . The patient is awake and alert. No acute distress. Respiratory:  . No increased work of breathing. . No wheezes, rales, or rhonchi. No tactile fremitus. . No tactile fremitus . The patient is very kyphotic  Cardiovascular:  . Regular rate and rhythm . No murmurs, ectopy, or gallups. . No lateral PMI. No thrills. Abdomen:  . Abdomen is soft, non-tender, non-distended.  . No hernias, masses, or organomegaly. . Hypoactive bowel sounds.  Musculoskeletal:  . No cyanosis, clubbing, or edema. Kermit Balo capillary refill. Skin:  . No rashes, lesions, ulcers . palpation of skin: no induration or nodules Neurologic:  . I am unable to evaluate due to the patient's inability to cooperate with exam. Psychiatric:  I am unable to evaluate due to the patient's inability to cooperate with exam.   I have personally reviewed the following:   Today's Data  . Vitals, CBC   Scheduled Meds: . Chlorhexidine Gluconate Cloth  6 each Topical Daily  . docusate sodium  100 mg Oral BID  . enoxaparin (LOVENOX) injection  40 mg Subcutaneous Q24H  . mupirocin ointment  1 application Nasal BID  . senna  1 tablet Oral BID   Continuous Infusions:   Principal Problem:   Intertrochanteric fracture of left hip (HCC) Active Problems:   Anemia   Hyperglycemia   Closed comminuted intertrochanteric fracture of left femur (HCC)   LOS: 4 days   A & P  L hip intertrochanteric fracture: As per orthopedic surgery. S/P intramedullary fixation. PT/OT/ social work consults. The patient is awaiting placement. She will have a COVID-19 retested today.  Anemia:Check ferritin, iron, tibc, b12, folate. Hemoglobin is stable.  Hyperglycemia: Check hga1c, lipid. If Hga1c >6.5, please start on SSI  Osteoporosis: Hold Fosamax for now. Check  vitamin D level.  I have seen and examined this patient myself. I have spent 30 minutes in her evaluation and care. I have discussed the patient in detail with her son Christine Jennings. All questions answered to the best of my ability.  DVT Prophylaxis   Lovenox. Family Communication: No family at bedside. Code Status:  FULL CODE  Kayle Correa, DO [facility] Direct contact: see [email or web address]  7PM-7AM contact night coverage as above 07/03/2018, 5:32 PM  LOS: 1 day

## 2018-07-03 NOTE — TOC Progression Note (Signed)
Transition of Care [facility]) - Progression Note    Patient Details  Name: VALYN LATCHFORD MRN: [id number] Date of Birth: 03/01/1927  Transition of Care [facility]) CM/SW [place], [place] Phone Number: 07/03/2018, 4:19 PM  Clinical Narrative:    CSW spoke with pt at bedside; pt care coordinator Anderson Malta with Aging with [facility] also present during visit via speaker phone. We discussed offer from [facility] for SNF placement. We discussed limitations for visitation and pt son will be called by [facility] to discuss testing and COVID precautions.   Pt agreeable to going; offer accepted and admissions liaison Levada Dy will contact pt son Merry Proud to discuss further questions. Additional COVID test requested and MD will place order.    Expected Discharge Plan: [place] Barriers to Discharge: Continued Medical Work up  Expected Discharge Plan and Services Expected Discharge Plan: [place] Choice: [place] arrangements for the past 2 months: Single Family Home     Social Determinants of Health (SDOH) Interventions    Readmission Risk Interventions No flowsheet data found.

## 2018-07-04 NOTE — Social Work (Signed)
Clinical Social Worker facilitated patient discharge including contacting patient family and facility to confirm patient discharge plans.  Clinical information faxed to facility and family agreeable with plan.  CSW arranged ambulance transport via [facility] to [facility] [employer] to call [phone]  with report prior to discharge.  Clinical Social Worker will sign off for now as social work intervention is no longer needed. Please consult [place] again if new need arises.  Westley Hummer, MSW, [place] Social Worker [phone]

## 2018-07-04 NOTE — TOC Transition Note (Signed)
Transition of Care [facility]) - CM/SW Discharge Note   Patient Details  Name: SHAMYA MACFADDEN MRN: [id number] Date of Birth: 08/28/1927  [facility]) CM/SW Contact:  Alexander Mt, [place] Phone Number: 07/04/2018, 4:47 PM   Clinical Narrative:    Pt seen by SLP and PT; stable for dc per MD.  Pt and pt son Merry Proud aware of discharge during [place] visit with pt in the room (pt son on speaker phone). [facility] called for next available. Pt Aging with [facility] coordinator Anderson Malta will pick up flowers and will bring pt belongings to [name]. Pt is aware of this.   RN has called report, signed scripts on chart and [facility] papers on chart.   Final next level of care: Skilled Nursing Facility Barriers to Discharge: Barriers Resolved   Patient Goals and CMS Choice Patient states their goals for this hospitalization and ongoing recovery are:: to go to rehab then home CMS Medicare.gov Compare Post Acute Care list provided to:: Patient Choice offered to / list presented to : Patient(and pt Aging with [facility] coordinator [place])  Discharge Placement   Existing PASRR number confirmed : 07/03/18          Patient chooses bed at: Other - please specify in the comment section below:([facility]) Patient to be transferred to facility by: [place] Name of family member notified: pt son Merry Proud Patient and family notified of of transfer: 07/04/18  Discharge Plan and Services     Post Acute Care Choice: [place]                               Social Determinants of Health (SDOH) Interventions     Readmission Risk Interventions No flowsheet data found.

## 2018-07-04 NOTE — Evaluation (Signed)
Clinical/Bedside Swallow Evaluation Patient Details  Name: Christine Jennings MRN: [id number] Date of Birth: June 26, 1927  Today's Date: 07/04/2018 Time: SLP Start Time (ACUTE ONLY): 1407 SLP Stop Time (ACUTE ONLY): 1427 SLP Time Calculation (min) (ACUTE ONLY): 20 min  Past Medical History:  Past Medical History:  Diagnosis Date  . Osteoporosis    Past Surgical History:  Past Surgical History:  Procedure Laterality Date  . ABDOMINAL SURGERY     Cyst Removal   . INTRAMEDULLARY (IM) NAIL INTERTROCHANTERIC Right 06/25/2017   Procedure: INTRAMEDULLARY (IM) NAIL INTERTROCHANTRIC;  Surgeon: Christine Jennings, Brian, MD;  Location: [facility] ORS;  Service: Orthopedics;  Laterality: Right;  . INTRAMEDULLARY (IM) NAIL INTERTROCHANTERIC Left 06/30/2018   Procedure: INTRAMEDULLARY (IM) NAIL INTERTROCHANTRIC;  Surgeon: Christine Jennings, Brian, MD;  Location: [facility] OR;  Service: Orthopedics;  Laterality: Left;  . Left Breast Surgery Left    HPI:  Pt is an 85 y.o. female admitted 06/29/18 after fall sustaining L intertrochanteric fx. S/p L femoral IM nail 6/26. Pt and son reported difficulty swallowing pills, prompting SLP swallow evaluation to be ordered. PMH includes osteoporosis, s/p R femoral IM nail after fall in 06/2017.    Assessment / Plan / Recommendation Clinical Impression  Pt's oropharyngeal swallow appears to be within functional limits. She reports intermittent globus sensation with pills, which is alleviated with a liquid wash. We discussed using a liquid wash versus crushing larger pills per her comfort level. She otherwise denies any subjective difficulty with any food or drink going down. Recommend continuing regular diet and thin liquids with use of liquid wash PRN. Acute SLP f/u not indicated at this time. SLP also spoke with her son, Christine Jennings PaulaJeff, via phone, and he is in agreement with plan.   SLP Visit Diagnosis: Dysphagia, unspecified (R13.10)    Aspiration Risk  Mild aspiration risk    Diet Recommendation  Regular;Thin liquid   Liquid Administration via: Cup;Straw Medication Administration: Whole meds with liquid(crush larger) Supervision: Patient able to self feed;Intermittent supervision to cue for compensatory strategies Compensations: Slow rate;Small sips/bites;Follow solids with liquid Postural Changes: Seated upright at 90 degrees;Remain upright for at least 30 minutes after po intake    Other  Recommendations Oral Care Recommendations: Oral care BID   Follow up Recommendations None      Frequency and Duration            Prognosis Prognosis for Safe Diet Advancement: Good      Swallow Study   General HPI: Pt is an 85 y.o. female admitted 06/29/18 after fall sustaining L intertrochanteric fx. S/p L femoral IM nail 6/26. Pt and son reported difficulty swallowing pills, prompting SLP swallow evaluation to be ordered. PMH includes osteoporosis, s/p R femoral IM nail after fall in 06/2017.  Type of Study: Bedside Swallow Evaluation Previous Swallow Assessment: none in chart Diet Prior to this Study: Regular;Thin liquids Temperature Spikes Noted: No Respiratory Status: Nasal cannula History of Recent Intubation: No Behavior/Cognition: Alert;Cooperative Oral Cavity Assessment: Within Functional Limits Oral Care Completed by SLP: No Oral Cavity - Dentition: Adequate natural dentition Vision: Functional for self-feeding Self-Feeding Abilities: Able to feed self Patient Positioning: Upright in chair Baseline Vocal Quality: Normal Volitional Swallow: Able to elicit    Oral/Motor/Sensory Function Overall Oral Motor/Sensory Function: Within functional limits   Ice Chips Ice chips: Not tested   Thin Liquid Thin Liquid: Within functional limits Presentation: Self Fed;Straw    Nectar Thick Nectar Thick Liquid: Not tested   Honey Thick Honey Thick Liquid: Not tested  Puree Puree: Within functional limits Presentation: Self Fed;Spoon   Solid     Solid: Within functional  limits Presentation: Christine Jennings 07/04/2018,2:49 PM  Christine Jennings, M.A. [place] Acute Environmental education officer [phone] Office [phone]

## 2018-07-04 NOTE — Progress Notes (Signed)
Physical Therapy Treatment Patient Details Name: Christine Jennings MRN: [id number] DOB: 24-May-1927 Today's Date: 07/04/2018    History of Present Illness Pt is an 85 y.o. female admitted 06/29/18 after fall sustaining L intertrochanteric fx. S/p L femoral IM nail 6/26. PMH includes osteoporosis, s/p R femoral IM nail after fall in 06/2017.    PT Comments    Patient received up in chair - requires motivation to perform standing tasks as she is limited by pain. Patient requiring Mod/Max A +2 to stand from recliner with cueing throughout for safety, posturing and weight bearing. Noted forward flexed posture and reduced weight shift to L LE. Will continue to recommend SNF at discharge.     Follow Up Recommendations  SNF;Supervision for mobility/OOB     Equipment Recommendations  Other (comment)(TBD)    Recommendations for Other Services       Precautions / Restrictions Precautions Precautions: Fall Restrictions Weight Bearing Restrictions: Yes LLE Weight Bearing: Weight bearing as tolerated    Mobility  Bed Mobility               General bed mobility comments: up in recliner  Transfers Overall transfer level: Needs assistance Equipment used: Rolling walker (2 wheeled) Transfers: Sit to/from Stand Sit to Stand: Mod assist;Max assist;+2 physical assistance         General transfer comment: Mod/Max A +2 to stand from recliner; use of bed pad to scoot hips to edge of seat as patient was unable; cueing for hand placement; standing ~2 minutes for pericare  Ambulation/Gait             General Gait Details: deferred   Stairs             Wheelchair Mobility    Modified Rankin (Stroke Patients Only)       Balance Overall balance assessment: Needs assistance Sitting-balance support: Bilateral upper extremity supported;Feet supported Sitting balance-Leahy Scale: Fair     Standing balance support: Bilateral upper extremity supported;During functional  activity Standing balance-Leahy Scale: Poor Standing balance comment: Reliant on UE support and external assist to maintain standing balance                            Cognition Arousal/Alertness: Awake/alert Behavior During Therapy: WFL for tasks assessed/performed Overall Cognitive Status: No family/caregiver present to determine baseline cognitive functioning                                 General Comments: reduced awareness of safety; increased time for processing      Exercises      General Comments        Pertinent Vitals/Pain Pain Assessment: Faces Faces Pain Scale: Hurts little more Pain Location: LLE Pain Descriptors / Indicators: Aching;Discomfort;Grimacing;Guarding Pain Intervention(s): Limited activity within patient's tolerance;Monitored during session;Repositioned    Home Living                      Prior Function            PT Goals (current goals can now be found in the care plan section) Acute Rehab PT Goals Patient Stated Goal: Less pain; agreeable to SNF PT Goal Formulation: With patient Time For Goal Achievement: 07/15/18 Potential to Achieve Goals: Good Progress towards PT goals: Progressing toward goals    Frequency    Min 3X/week      PT  Plan Current plan remains appropriate    Co-evaluation              AM-PAC PT "6 Clicks" Mobility   Outcome Measure  Help needed turning from your back to your side while in a flat bed without using bedrails?: A Lot Help needed moving from lying on your back to sitting on the side of a flat bed without using bedrails?: A Lot Help needed moving to and from a bed to a chair (including a wheelchair)?: A Lot Help needed standing up from a chair using your arms (e.g., wheelchair or bedside chair)?: A Lot Help needed to walk in hospital room?: A Lot Help needed climbing 3-5 steps with a railing? : Total 6 Click Score: 11    End of Session Equipment Utilized  During Treatment: Gait belt Activity Tolerance: Patient tolerated treatment well;Patient limited by pain Patient left: in chair;with call bell/phone within reach;with nursing/sitter in room Nurse Communication: Mobility status PT Visit Diagnosis: Other abnormalities of gait and mobility (R26.89);Muscle weakness (generalized) (M62.81);Pain Pain - Right/Left: Left Pain - part of body: Hip;Leg     Time: 4431-5400 PT Time Calculation (min) (ACUTE ONLY): 19 min  Charges:  $Therapeutic Activity: 8-22 mins                      Lanney Gins, PT, DPT Supplemental Physical Therapist 07/04/18 3:00 PM Pager: [phone] Office: [phone]

## 2018-07-04 NOTE — TOC Progression Note (Addendum)
Transition of Care [facility]) - Progression Note    Patient Details  Name: Christine Jennings MRN: [id number] Date of Birth: 28-Aug-1927  [facility]) CM/SW [place], [place] Phone Number: 07/04/2018, 8:56 AM  Clinical Narrative:    1:52pm- received another call from pt son Merry Proud. We discussed discharge order and pending swallow study. I let Merry Proud know that the swallow study would be completed before discharge and would be used to make recommendations for SNF. Pt son states understanding, he is aware pt is discharged and aware that once swallow study is complete pt will discharge. Pt summary and medications have been sent to [facility] [phone]  11:25am- CSW spoke with pt at bedside, pt very hard of hearing. CSW reintroduced self, role, reason for visit. We discussed [employer], we went over the CMS ratings again and pt has physical copy. CSW explained that this Probation officer had spoken with Dr. Benny Lennert and that pt was medically stable for dc from her point of view. We discussed that MD had ordered a swallow study for pt, as pt son had voiced concerns with CSW that pt was having trouble swallowing pills. Pt understands that study ordered, she expresses being upset that she cannot transfer to chair without significant assistance and does not know why she is medically stable. Pt requested MD come back by room and explain why she is stable for discharge. CSW secure messaged MD with pt concerns and requested another visit as MD is able to discuss medical readiness. [facility] is ready for pt, if pt is stable for dc today will need summary and medication list to fill medications with their pharmacy by 1pm (MD is aware of this request). If pt not stable to discharge today will update the SNF with dc tomorrow.  9:49am- CSW called and spoke with pt son Merry Proud, he is aware of negative covid test and the potential for discharge today. We discussed the pt concerns with  not being involved in conversation yesterday. Pt son acknowledges that pt has been involved with discussions regarding SNF and that pt received a call from her pastor which precipitated the end of CSW visit the previous day (6/30). Pt son has had his questions answered by MD and [name]. Will f/u with pt as well.   8:56am- Pt negative COVID screen noted; if pt is stable for dc await dc summary, dc order and any controlled scripts printed and signed.   CSW also removed "legal guardian" banner from pt chart. Pt does not have a court appointed legal guardian confirmed with pt, pt son Merry Proud, and pt care coordinator [place].    Expected Discharge Plan: [place] Barriers to Discharge: Continued Medical Work up  Expected Discharge Plan and Services Expected Discharge Plan: [place] Choice: [place] arrangements for the past 2 months: Single Family Home   Social Determinants of Health (SDOH) Interventions    Readmission Risk Interventions No flowsheet data found.

## 2018-07-04 NOTE — Progress Notes (Signed)
Notified Merry Proud that patient has been discharged to [facility].

## 2018-07-04 NOTE — Progress Notes (Signed)
Report called to Wells Guiles at [facility].

## 2018-07-04 NOTE — Discharge Summary (Addendum)
Physician Discharge Summary  DANYLAH HOLDEN ZOX:[id number] DOB: [birth year] DOA: 06/29/2018  PCP: Patient, No Pcp Per  Admit date: 06/29/2018 Discharge date: 07/04/2018  Recommendations for Outpatient Follow-up:  1. PT/OT in SNF setting 2. Follow up with orthopedic surgery in 2 weeks. 3. Follow up with PCP in 7-10 days after discharge from SNF.  Follow-up Information    Swinteck, Aaron Edelman, MD. Schedule an appointment as soon as possible for a visit in 2 weeks.   Specialty: Orthopedic Surgery Why: For wound re-check Contact information: [address] [place] [address] [phone]           Discharge Diagnoses: Principal diagnosis is #1 1. Left hip intertrochangeric fracture. S/P intramedullary fixation. 2. Anemia: Hemoglobin stable. Mixed etiologies. Anemia of chronic disease with dilutional effect and postoperative bleeding.  3. Osteoporosis 4. Hyperglycemia: Reactive  Discharge Condition: Fair  Disposition: SNF  Diet recommendation: Regular  Filed Weights   06/30/18 0048 06/30/18 0245 07/01/18 0423  Weight: 54.2 kg 54.2 kg 53.7 kg    History of present illness:  Patient is a 85 yr old woman was at home when she fell and suffered a Right intertrochanteric fracture. [facility] were consulted to admit the patient for further evaluation and treatment. Orthopedic surgery was consulted to address the hip fracture. The patient went to the OR this morning for intramedullary fixation of the left femur.  Hospital Course:  The patient was evaluated by orthopedic surgery. She underwent intramedullary nailing on 06/30/2018. She has tolerated the procedure well. She has had some confusion that is probably related to sundowning and narcotics.  Today's assessment: S: The patient is awake, alert, and oriented x 3. No acute distress. O: Vitals:  Vitals:   07/03/18 2213 07/04/18 0556  BP: (!) 152/76 (!) 134/59  Pulse: 90 94  Resp: 18 17  Temp: 98.2 F  (36.8 C) 98.1 F (36.7 C)  SpO2: 99% 98%   Constitutional:   The patient is awake and alert. No acute distress. Respiratory:   No increased work of breathing.  No wheezes, rales, or rhonchi. No tactile fremitus.  No tactile fremitus  The patient is very kyphotic  Cardiovascular:   Regular rate and rhythm  No murmurs, ectopy, or gallups.  No lateral PMI. No thrills. Abdomen:   Abdomen is soft, non-tender, non-distended.  No hernias, masses, or organomegaly.  Hypoactive bowel sounds.  Musculoskeletal:   No cyanosis, clubbing, or edema.  Good capillary refill. Skin:   No rashes, lesions, ulcers  palpation of skin: no induration or nodules Neurologic:   Pt is awake, alert, and oriented x 3. CN II - XII is intact. Motor and sensory intact.  Discharge Instructions  Discharge Instructions    Activity as tolerated - No restrictions   Complete by: As directed    Call MD for:  persistant nausea and vomiting   Complete by: As directed    Call MD for:  severe uncontrolled pain   Complete by: As directed    Diet - low sodium heart healthy   Complete by: As directed    Discharge instructions   Complete by: As directed    PT/OT to evaluate and treat at SNF Follow up with orthopedic surgery in 2 weeks. Follow up with PCP in 7-10 days.   Increase activity slowly   Complete by: As directed      Allergies as of 07/04/2018   No Known Allergies     Medication List    STOP taking these  medications   alendronate 70 MG tablet Commonly known as: FOSAMAX   enoxaparin 40 MG/0.4ML injection Commonly known as: LOVENOX   ketoconazole 2 % cream Commonly known as: NIZORAL   multivitamin with minerals Tabs tablet     TAKE these medications   aspirin 81 MG chewable tablet Commonly known as: Aspirin Childrens Chew 1 tablet (81 mg total) by mouth 2 (two) times daily with a meal.   docusate sodium 100 MG capsule Commonly known as: COLACE Take 1 capsule (100 mg  total) by mouth 2 (two) times daily.   feeding supplement (ENSURE ENLIVE) Liqd Take 237 mLs by mouth 2 (two) times daily between meals.   HYDROcodone-acetaminophen 5-325 MG tablet Commonly known as: NORCO/VICODIN Take 1-2 tablets by mouth every 4 (four) hours as needed for moderate pain (pain score 4-6). What changed:   when to take this  reasons to take this   multivitamin tablet Take 1 tablet by mouth daily.   polyethylene glycol 17 g packet Commonly known as: MIRALAX / GLYCOLAX Take 17 g by mouth daily as needed for mild constipation.   VITAMIN C PO Take 1 tablet by mouth daily.   VITAMIN D PO Take 1 tablet by mouth daily.      No Known Allergies  The results of significant diagnostics from this hospitalization (including imaging, microbiology, ancillary and laboratory) are listed below for reference.    Significant Diagnostic Studies: Dg Chest 1 View  Result Date: 06/29/2018 CLINICAL DATA:  Pain status post fall EXAM: CHEST  1 VIEW COMPARISON:  June 25, 2017 FINDINGS: Chronic lung changes are noted bilaterally consistent with emphysema. The lungs are hyperexpanded. The heart is mildly enlarged but stable from prior study. There is a stable rim calcified structure in the left upper quadrant of the abdomen. Aortic calcifications are noted. There appear to be some calcified hilar and mediastinal lymph nodes bilaterally. IMPRESSION: 1. No definite acute cardiopulmonary process. 2. Chronic lung changes bilaterally with somewhat hyperexpanded lungs which can be seen in patients with COPD. 3. Stable peripherally calcified structure in the left upper quadrant of unknown clinical significance. This could represent a partially calcified aneurysm in the appropriate clinical setting. Consider an outpatient nonemergent follow-up CT for further evaluation of this finding. Electronically Signed   By: Katherine Mantlehristopher  Green M.D.   On: 06/29/2018 19:50   Ct Head Wo Contrast  Result Date:  06/29/2018 CLINICAL DATA:  Fall EXAM: CT HEAD WITHOUT CONTRAST TECHNIQUE: Contiguous axial images were obtained from the base of the skull through the vertex without intravenous contrast. COMPARISON:  None. FINDINGS: Brain: There is no mass, hemorrhage or extra-axial collection. There is generalized atrophy without lobar predilection. Hypodensity of the white matter is most commonly associated with chronic microvascular disease. Vascular: Atherosclerotic calcification of the internal carotid arteries at the skull base. No abnormal hyperdensity of the major intracranial arteries or dural venous sinuses. Skull: The visualized skull base, calvarium and extracranial soft tissues are normal. Sinuses/Orbits: No fluid levels or advanced mucosal thickening of the visualized paranasal sinuses. No mastoid or middle ear effusion. The orbits are normal. IMPRESSION: Chronic small vessel ischemia and generalized volume loss without acute intracranial abnormality. Electronically Signed   By: Deatra RobinsonKevin  Herman M.D.   On: 06/29/2018 19:53   Pelvis Portable  Result Date: 06/30/2018 CLINICAL DATA:  85 year old female status post left femur ORIF. EXAM: PORTABLE PELVIS 1-2 VIEWS COMPARISON:  06/29/2018 and earlier. FINDINGS: Portable AP view at 1138 hours. Mostly visible proximal left femur hardware with intramedullary  rod, proximal interlocking dynamic hip screw and distal interlocking cortical screw. These traverse the intertrochanteric fracture which now demonstrates near anatomic alignment. Femoral heads are normally located. Previous right femur ORIF. Pelvis intact. IMPRESSION: ORIF proximal left femur with no adverse features. Electronically Signed   By: Odessa Fleming M.D.   On: 06/30/2018 11:13   Dg C-arm 1-60 Min  Result Date: 06/30/2018 CLINICAL DATA:  Open reduction and internal fixation of left hip fracture. EXAM: DG C-ARM 61-120 MIN; OPERATIVE LEFT HIP WITH PELVIS FLUOROSCOPY TIME:  41 seconds. COMPARISON:  Radiographs of June 29, 2018. FINDINGS: Two intraoperative fluoroscopic images were obtained of the left hip. These images demonstrate surgical internal fixation of proximal left femoral intertrochanteric fracture. Good alignment of fracture components is noted. IMPRESSION: Fluoroscopic guidance provided during surgical internal fixation of proximal left femoral intertrochanteric fracture. Electronically Signed   By: Lupita Raider M.D.   On: 06/30/2018 10:46   Dg Hip Operative Unilat W Or W/o Pelvis Left  Result Date: 06/30/2018 CLINICAL DATA:  Open reduction and internal fixation of left hip fracture. EXAM: DG C-ARM 61-120 MIN; OPERATIVE LEFT HIP WITH PELVIS FLUOROSCOPY TIME:  41 seconds. COMPARISON:  Radiographs of June 29, 2018. FINDINGS: Two intraoperative fluoroscopic images were obtained of the left hip. These images demonstrate surgical internal fixation of proximal left femoral intertrochanteric fracture. Good alignment of fracture components is noted. IMPRESSION: Fluoroscopic guidance provided during surgical internal fixation of proximal left femoral intertrochanteric fracture. Electronically Signed   By: Lupita Raider M.D.   On: 06/30/2018 10:46   Dg Hip Unilat With Pelvis 2-3 Views Left  Result Date: 06/29/2018 CLINICAL DATA:  Fall EXAM: DG HIP (WITH OR WITHOUT PELVIS) 2-3V LEFT COMPARISON:  06/25/2017 FINDINGS: Prior intramedullary rodding of the right femur for old intertrochanteric fracture. Acute left intertrochanteric fracture with mild displacement and varus angulation of distal fracture fragment. Left femoral head projects in joint. IMPRESSION: 1. Acute left intertrochanteric fracture. 2. Prior intramedullary rodding of right intertrochanteric fracture Electronically Signed   By: Jasmine Pang M.D.   On: 06/29/2018 19:33    Microbiology: Recent Results (from the past 240 hour(s))  SARS Coronavirus 2 (CEPHEID - Performed in [facility] hospital lab), Hosp Order     Status: None   Collection Time:  06/29/18  8:13 PM   Specimen: Nasopharyngeal Swab  Result Value Ref Range Status   SARS Coronavirus 2 NEGATIVE NEGATIVE Final    Comment: (NOTE) If result is NEGATIVE SARS-CoV-2 target nucleic acids are NOT DETECTED. The SARS-CoV-2 RNA is generally detectable in upper and lower  respiratory specimens during the acute phase of infection. The lowest  concentration of SARS-CoV-2 viral copies this assay can detect is 250  copies / mL. A negative result does not preclude SARS-CoV-2 infection  and should not be used as the sole basis for treatment or other  patient management decisions.  A negative result may occur with  improper specimen collection / handling, submission of specimen other  than nasopharyngeal swab, presence of viral mutation(s) within the  areas targeted by this assay, and inadequate number of viral copies  (<250 copies / mL). A negative result must be combined with clinical  observations, patient history, and epidemiological information. If result is POSITIVE SARS-CoV-2 target nucleic acids are DETECTED. The SARS-CoV-2 RNA is generally detectable in upper and lower  respiratory specimens dur ing the acute phase of infection.  Positive  results are indicative of active infection with SARS-CoV-2.  Clinical  correlation  with patient history and other diagnostic information is  necessary to determine patient infection status.  Positive results do  not rule out bacterial infection or co-infection with other viruses. If result is PRESUMPTIVE POSTIVE SARS-CoV-2 nucleic acids MAY BE PRESENT.   A presumptive positive result was obtained on the submitted specimen  and confirmed on repeat testing.  While 2019 novel coronavirus  (SARS-CoV-2) nucleic acids may be present in the submitted sample  additional confirmatory testing may be necessary for epidemiological  and / or clinical management purposes  to differentiate between  SARS-CoV-2 and other Sarbecovirus currently known to  infect humans.  If clinically indicated additional testing with an alternate test  methodology [phone]([id number]) is advised. The SARS-CoV-2 RNA is generally  detectable in upper and lower respiratory sp ecimens during the acute  phase of infection. The expected result is Negative. Fact Sheet for Patients:  BoilerBrush.com.cyhttps://www.fda.gov/media/136312/download Fact Sheet for Healthcare Providers: https://pope.com/https://www.fda.gov/media/136313/download This test is not yet approved or cleared by the [place] FDA and has been authorized for detection and/or diagnosis of SARS-CoV-2 by FDA under an Emergency Use Authorization (EUA).  This EUA will remain in effect (meaning this test can be used) for the duration of the COVID-19 declaration under Section 564(b)(1) of the Act, 21 U.S.C. section 360bbb-3(b)(1), unless the authorization is terminated or revoked sooner. Performed at [facility], [address] [place], [place] [address]   Surgical pcr screen     Status: Abnormal   Collection Time: 06/29/18 11:45 PM   Specimen: Nasal Mucosa; Nasal Swab  Result Value Ref Range Status   MRSA, PCR NEGATIVE NEGATIVE Final   Staphylococcus aureus POSITIVE (A) NEGATIVE Final    Comment: (NOTE) The Xpert SA Assay (FDA approved for NASAL specimens in patients 83 years of age and older), is one component of a comprehensive surveillance program. It is not intended to diagnose infection nor to guide or monitor treatment. Performed at [facility], [address] [place], [place] [address]   SARS Coronavirus 2 (CEPHEID - Performed in [facility] hospital lab), Hosp Order     Status: None   Collection Time: 07/03/18  5:45 PM   Specimen: Nasopharyngeal Swab  Result Value Ref Range Status   SARS Coronavirus 2 NEGATIVE NEGATIVE Final    Comment: (NOTE) If result is NEGATIVE SARS-CoV-2 target nucleic acids are NOT DETECTED. The SARS-CoV-2 RNA is generally detectable in upper and lower  respiratory  specimens during the acute phase of infection. The lowest  concentration of SARS-CoV-2 viral copies this assay can detect is 250  copies / mL. A negative result does not preclude SARS-CoV-2 infection  and should not be used as the sole basis for treatment or other  patient management decisions.  A negative result may occur with  improper specimen collection / handling, submission of specimen other  than nasopharyngeal swab, presence of viral mutation(s) within the  areas targeted by this assay, and inadequate number of viral copies  (<250 copies / mL). A negative result must be combined with clinical  observations, patient history, and epidemiological information. If result is POSITIVE SARS-CoV-2 target nucleic acids are DETECTED. The SARS-CoV-2 RNA is generally detectable in upper and lower  respiratory specimens dur ing the acute phase of infection.  Positive  results are indicative of active infection with SARS-CoV-2.  Clinical  correlation with patient history and other diagnostic information is  necessary to determine patient infection status.  Positive results do  not rule out bacterial infection  or co-infection with other viruses. If result is PRESUMPTIVE POSTIVE SARS-CoV-2 nucleic acids MAY BE PRESENT.   A presumptive positive result was obtained on the submitted specimen  and confirmed on repeat testing.  While 2019 novel coronavirus  (SARS-CoV-2) nucleic acids may be present in the submitted sample  additional confirmatory testing may be necessary for epidemiological  and / or clinical management purposes  to differentiate between  SARS-CoV-2 and other Sarbecovirus currently known to infect humans.  If clinically indicated additional testing with an alternate test  methodology [phone]([id number]) is advised. The SARS-CoV-2 RNA is generally  detectable in upper and lower respiratory sp ecimens during the acute  phase of infection. The expected result is Negative. Fact Sheet for  Patients:  BoilerBrush.com.cyhttps://www.fda.gov/media/136312/download Fact Sheet for Healthcare Providers: https://pope.com/https://www.fda.gov/media/136313/download This test is not yet approved or cleared by the [place] FDA and has been authorized for detection and/or diagnosis of SARS-CoV-2 by FDA under an Emergency Use Authorization (EUA).  This EUA will remain in effect (meaning this test can be used) for the duration of the COVID-19 declaration under Section 564(b)(1) of the Act, 21 U.S.C. section 360bbb-3(b)(1), unless the authorization is terminated or revoked sooner. Performed at [facility], [address] [place], [place] [address]      Labs: Basic Metabolic Panel: Recent Labs  Lab 06/29/18 1954 06/30/18 1317 07/01/18 0312 07/02/18 0252  NA 140  --  139 136  K 4.5  --  4.3 4.0  CL 105  --  106 103  CO2 29  --  27 27  GLUCOSE 170*  --  113* 114*  BUN 28*  --  16 17  CREATININE 0.66 0.70 0.70 0.69  CALCIUM 9.1  --  7.9* 8.0*   Liver Function Tests: Recent Labs  Lab 06/29/18 1954  AST 24  ALT 24  ALKPHOS 50  BILITOT 0.6  PROT 5.8*  ALBUMIN 3.5   No results for input(s): LIPASE, AMYLASE in the last 168 hours. No results for input(s): AMMONIA in the last 168 hours. CBC: Recent Labs  Lab 06/29/18 1954 06/30/18 1317 07/01/18 0312 07/02/18 0252 07/03/18 0206  WBC 18.8* 11.6* 8.6 9.5 6.3  NEUTROABS 16.4*  --   --   --   --   HGB 11.6* 9.0* 8.8* 8.5* 7.5*  HCT 35.7* 27.7* 26.7* 25.8* 22.8*  MCV 97.8 95.8 97.1 97.0 95.4  PLT 163 PLATELET CLUMPS NOTED ON SMEAR, UNABLE TO ESTIMATE 110* 110* 120*   Cardiac Enzymes: No results for input(s): CKTOTAL, CKMB, CKMBINDEX, TROPONINI in the last 168 hours. BNP: BNP (last 3 results) No results for input(s): BNP in the last 8760 hours.  ProBNP (last 3 results) No results for input(s): PROBNP in the last 8760 hours.  CBG: No results for input(s): GLUCAP in the last 168 hours.  Principal Problem:   Intertrochanteric fracture of  left hip (HCC) Active Problems:   Anemia   Hyperglycemia   Closed comminuted intertrochanteric fracture of left femur (HCC)   Time coordinating discharge: 38 minutes.  Signed:        Arryanna Holquin, DO [facility]  07/04/2018, 1:00 PM

## 2019-11-02 ENCOUNTER — Other Ambulatory Visit: Payer: Self-pay | Admitting: Family Medicine

## 2019-11-02 DIAGNOSIS — M810 Age-related osteoporosis without current pathological fracture: Secondary | ICD-10-CM

## 2020-08-09 ENCOUNTER — Encounter (HOSPITAL_COMMUNITY): Payer: Self-pay | Admitting: Internal Medicine

## 2020-08-09 ENCOUNTER — Inpatient Hospital Stay (HOSPITAL_COMMUNITY): Payer: Medicare Other

## 2020-08-09 ENCOUNTER — Other Ambulatory Visit: Payer: Self-pay

## 2020-08-09 ENCOUNTER — Emergency Department (HOSPITAL_COMMUNITY): Payer: Medicare Other

## 2020-08-09 ENCOUNTER — Inpatient Hospital Stay (HOSPITAL_COMMUNITY)
Admission: EM | Admit: 2020-08-09 | Discharge: 2020-08-17 | DRG: 481 | Disposition: A | Discharge status: Skilled Nursing Facility | Payer: Medicare Other | Attending: Internal Medicine | Admitting: Internal Medicine

## 2020-08-09 DIAGNOSIS — S72422A Displaced fracture of lateral condyle of left femur, initial encounter for closed fracture: Principal | ICD-10-CM | POA: Diagnosis present

## 2020-08-09 DIAGNOSIS — W19XXXA Unspecified fall, initial encounter: Secondary | ICD-10-CM

## 2020-08-09 DIAGNOSIS — Z79899 Other long term (current) drug therapy: Secondary | ICD-10-CM | POA: Diagnosis not present

## 2020-08-09 DIAGNOSIS — R5381 Other malaise: Secondary | ICD-10-CM | POA: Diagnosis present

## 2020-08-09 DIAGNOSIS — Z419 Encounter for procedure for purposes other than remedying health state, unspecified: Secondary | ICD-10-CM

## 2020-08-09 DIAGNOSIS — Z7983 Long term (current) use of bisphosphonates: Secondary | ICD-10-CM

## 2020-08-09 DIAGNOSIS — Z66 Do not resuscitate: Secondary | ICD-10-CM | POA: Diagnosis present

## 2020-08-09 DIAGNOSIS — Y92002 Bathroom of unspecified non-institutional (private) residence single-family (private) house as the place of occurrence of the external cause: Secondary | ICD-10-CM

## 2020-08-09 DIAGNOSIS — R079 Chest pain, unspecified: Secondary | ICD-10-CM | POA: Diagnosis not present

## 2020-08-09 DIAGNOSIS — D72828 Other elevated white blood cell count: Secondary | ICD-10-CM | POA: Diagnosis present

## 2020-08-09 DIAGNOSIS — R1310 Dysphagia, unspecified: Secondary | ICD-10-CM | POA: Diagnosis present

## 2020-08-09 DIAGNOSIS — M810 Age-related osteoporosis without current pathological fracture: Secondary | ICD-10-CM | POA: Diagnosis present

## 2020-08-09 DIAGNOSIS — R739 Hyperglycemia, unspecified: Secondary | ICD-10-CM | POA: Diagnosis present

## 2020-08-09 DIAGNOSIS — S7290XA Unspecified fracture of unspecified femur, initial encounter for closed fracture: Secondary | ICD-10-CM | POA: Diagnosis present

## 2020-08-09 DIAGNOSIS — S72402A Unspecified fracture of lower end of left femur, initial encounter for closed fracture: Secondary | ICD-10-CM

## 2020-08-09 DIAGNOSIS — D62 Acute posthemorrhagic anemia: Secondary | ICD-10-CM | POA: Diagnosis present

## 2020-08-09 DIAGNOSIS — Z20822 Contact with and (suspected) exposure to covid-19: Secondary | ICD-10-CM | POA: Diagnosis present

## 2020-08-09 DIAGNOSIS — S72492A Other fracture of lower end of left femur, initial encounter for closed fracture: Secondary | ICD-10-CM

## 2020-08-09 DIAGNOSIS — W010XXA Fall on same level from slipping, tripping and stumbling without subsequent striking against object, initial encounter: Secondary | ICD-10-CM | POA: Diagnosis present

## 2020-08-09 LAB — CBC WITH DIFFERENTIAL/PLATELET
Abs Immature Granulocytes: 0.08 10*3/uL — ABNORMAL HIGH (ref 0.00–0.07)
Basophils Absolute: 0 10*3/uL (ref 0.0–0.1)
Basophils Relative: 0 %
Eosinophils Absolute: 0 10*3/uL (ref 0.0–0.5)
Eosinophils Relative: 0 %
HCT: 42 % (ref 36.0–46.0)
Hemoglobin: 13.7 g/dL (ref 12.0–15.0)
Immature Granulocytes: 1 %
Lymphocytes Relative: 3 %
Lymphs Abs: 0.4 10*3/uL — ABNORMAL LOW (ref 0.7–4.0)
MCH: 30.8 pg (ref 26.0–34.0)
MCHC: 32.6 g/dL (ref 30.0–36.0)
MCV: 94.4 fL (ref 80.0–100.0)
Monocytes Absolute: 0.7 10*3/uL (ref 0.1–1.0)
Monocytes Relative: 5 %
Neutro Abs: 12.9 10*3/uL — ABNORMAL HIGH (ref 1.7–7.7)
Neutrophils Relative %: 91 %
Platelets: 172 10*3/uL (ref 150–400)
RBC: 4.45 MIL/uL (ref 3.87–5.11)
RDW: 12.4 % (ref 11.5–15.5)
WBC: 14.1 10*3/uL — ABNORMAL HIGH (ref 4.0–10.5)
nRBC: 0 % (ref 0.0–0.2)

## 2020-08-09 LAB — BASIC METABOLIC PANEL
Anion gap: 11 (ref 5–15)
BUN: 25 mg/dL — ABNORMAL HIGH (ref 8–23)
CO2: 29 mmol/L (ref 22–32)
Calcium: 9.4 mg/dL (ref 8.9–10.3)
Chloride: 100 mmol/L (ref 98–111)
Creatinine, Ser: 0.64 mg/dL (ref 0.44–1.00)
GFR, Estimated: >60 mL/min (ref >60)
Glucose, Bld: 171 mg/dL — ABNORMAL HIGH (ref 70–99)
Potassium: 3.7 mmol/L (ref 3.5–5.1)
Sodium: 140 mmol/L (ref 135–145)

## 2020-08-09 LAB — SARS CORONAVIRUS 2 (TAT 6-24 HRS): SARS Coronavirus 2: NEGATIVE

## 2020-08-09 LAB — PROTIME-INR
INR: 0.9 (ref 0.8–1.2)
Prothrombin Time: 11.9 seconds (ref 11.4–15.2)

## 2020-08-09 LAB — TYPE AND SCREEN
ABO/RH(D): O NEG
Antibody Screen: NEGATIVE

## 2020-08-09 MED ORDER — DOCUSATE SODIUM 100 MG PO CAPS
100.0000 mg | ORAL_CAPSULE | Freq: Two times a day (BID) | ORAL | Status: DC
  Administered 2020-08-09 – 2020-08-12 (×5): 100 mg via ORAL
  Filled 2020-08-09 (×8): qty 1

## 2020-08-09 MED ORDER — HYDROCODONE-ACETAMINOPHEN 5-325 MG PO TABS
1.0000 | ORAL_TABLET | Freq: Four times a day (QID) | ORAL | Status: DC | PRN
  Administered 2020-08-09 – 2020-08-10 (×2): 1 via ORAL
  Administered 2020-08-10: 2 via ORAL
  Administered 2020-08-10 – 2020-08-12 (×3): 1 via ORAL
  Administered 2020-08-14: 2 via ORAL
  Administered 2020-08-14: 1 via ORAL
  Administered 2020-08-14: 2 via ORAL
  Administered 2020-08-15 – 2020-08-17 (×3): 1 via ORAL
  Filled 2020-08-09: qty 2
  Filled 2020-08-09: qty 1
  Filled 2020-08-09: qty 2
  Filled 2020-08-09 (×4): qty 1
  Filled 2020-08-09: qty 2
  Filled 2020-08-09 (×5): qty 1

## 2020-08-09 MED ORDER — ONDANSETRON HCL 4 MG/2ML IJ SOLN
4.0000 mg | Freq: Four times a day (QID) | INTRAMUSCULAR | Status: DC | PRN
  Administered 2020-08-09: 4 mg via INTRAVENOUS
  Filled 2020-08-09: qty 2

## 2020-08-09 MED ORDER — ENOXAPARIN SODIUM 40 MG/0.4ML IJ SOSY
40.0000 mg | PREFILLED_SYRINGE | INTRAMUSCULAR | Status: DC
  Administered 2020-08-09: 40 mg via SUBCUTANEOUS
  Filled 2020-08-09: qty 0.4

## 2020-08-09 MED ORDER — FENTANYL CITRATE (PF) 100 MCG/2ML IJ SOLN
50.0000 ug | INTRAMUSCULAR | Status: DC | PRN
  Administered 2020-08-09: 50 ug via INTRAVENOUS
  Filled 2020-08-09: qty 2

## 2020-08-09 MED ORDER — HYDRALAZINE HCL 20 MG/ML IJ SOLN: 10.0000 mg | Freq: Three times a day (TID) | INTRAMUSCULAR | Status: DC | PRN

## 2020-08-09 MED ORDER — ENOXAPARIN SODIUM 30 MG/0.3ML IJ SOSY
30.0000 mg | PREFILLED_SYRINGE | INTRAMUSCULAR | Status: DC
  Administered 2020-08-10 – 2020-08-12 (×3): 30 mg via SUBCUTANEOUS
  Filled 2020-08-09 (×3): qty 0.3

## 2020-08-09 MED ORDER — MORPHINE SULFATE (PF) 2 MG/ML IV SOLN
0.5000 mg | INTRAVENOUS | Status: DC | PRN
  Administered 2020-08-09 – 2020-08-14 (×4): 0.5 mg via INTRAVENOUS
  Filled 2020-08-09 (×4): qty 1

## 2020-08-09 NOTE — ED Notes (Signed)
Patient's son Anjolie Majer [phone]

## 2020-08-09 NOTE — H&P (Signed)
History and Physical    Christine Jennings BZJ:[id number] DOB: 1927-03-03 DOA: 08/09/2020  PCP: Patient, No Pcp Per (Inactive)  Patient coming from: home  Chief Complaint: pain after fall  HPI: Christine Jennings is a 87 y.o. female with medical history significant of osteoporosis. Presenting with left knee pain after fall. This morning she went to the bathroom. As she was getting up from the toilet, she fell over. She didn't have any chest pain, palpitations, or dizziness prior to the fall. She remembers the entire fall. She did not pass out. She was unable to get up d/t pain. She thinks she hit her head. It was about an hour before someone was able to get her up. She came to the ED d/t the pain. She denies any other aggravating or alleviating factors.   ED Course: She was found to have a left distal knee fracture. Ortho was consulted. [facility] was called for admission.   Review of Systems:  Review of systems is otherwise negative for all not mentioned in HPI.   PMHx Past Medical History:  Diagnosis Date   Osteoporosis     PSHx Past Surgical History:  Procedure Laterality Date   ABDOMINAL SURGERY     Cyst Removal    INTRAMEDULLARY (IM) NAIL INTERTROCHANTERIC Right 06/25/2017   Procedure: INTRAMEDULLARY (IM) NAIL INTERTROCHANTRIC;  Surgeon: Samson Frederic, MD;  Location: [facility];  Service: Orthopedics;  Laterality: Right;   INTRAMEDULLARY (IM) NAIL INTERTROCHANTERIC Left 06/30/2018   Procedure: INTRAMEDULLARY (IM) NAIL INTERTROCHANTRIC;  Surgeon: Samson Frederic, MD;  Location: [facility];  Service: Orthopedics;  Laterality: Left;   Left Breast Surgery Left     SocHx  reports that she has never smoked. She has never used smokeless tobacco. She reports that she does not drink alcohol and does not use drugs.  No Known Allergies  FamHx Family History  Problem Relation Age of Onset   Heart disease Father     Prior to Admission medications   Medication Sig Start Date End Date Taking?  Authorizing Provider  Ascorbic Acid (VITAMIN C PO) Take 1 tablet by mouth daily.    [provider]  Cholecalciferol (VITAMIN D PO) Take 1 tablet by mouth daily.    [provider]  docusate sodium (COLACE) 100 MG capsule Take 1 capsule (100 mg total) by mouth 2 (two) times daily. Patient not taking: Reported on 06/29/2018 06/28/17   Glade Lloyd, MD  feeding supplement, ENSURE ENLIVE, (ENSURE ENLIVE) LIQD Take 237 mLs by mouth 2 (two) times daily between meals. Patient not taking: Reported on 06/29/2018 06/28/17   Glade Lloyd, MD  HYDROcodone-acetaminophen (NORCO/VICODIN) 5-325 MG tablet Take 1-2 tablets by mouth every 4 (four) hours as needed for moderate pain (pain score 4-6). 07/01/18   Swinteck, Arlys John, MD  Multiple Vitamin (MULTIVITAMIN) tablet Take 1 tablet by mouth daily.    [provider]  polyethylene glycol (MIRALAX / GLYCOLAX) packet Take 17 g by mouth daily as needed for mild constipation. Patient not taking: Reported on 06/29/2018 06/28/17   Glade Lloyd, MD    Physical Exam: Vitals:   08/09/20 1028 08/09/20 1230  BP: (!) 151/77 (!) 169/81  Pulse: 80 73  Resp: 18 18  Temp: 97.6 F (36.4 C)   TempSrc: Oral   SpO2: 98% 100%  Weight: 49.9 kg   Height: 5' (1.524 m)     General: 87 y.o. female resting in bed in NAD Eyes: PERRL, normal sclera ENMT: Nares patent w/o discharge, orophaynx clear, dentition normal,  ears w/o discharge/lesions/ulcers Neck: Supple, trachea midline Cardiovascular: RRR, +S1, S2, no m/g/r, equal pulses throughout Respiratory: CTABL, no w/r/r, normal WOB GI: BS+, NDNT, no masses noted, no organomegaly noted MSK: No e/c/c, left knee in brace w/ limited mobilit d/t pain Neuro: A&O x 3, no focal deficits Psyc: Appropriate interaction and affect, calm/cooperative  Labs on Admission: I have personally reviewed following labs and imaging studies  CBC: Recent Labs  Lab 08/09/20 1110  WBC 14.1*  NEUTROABS 12.9*  HGB 13.7   HCT 42.0  MCV 94.4  PLT 172   Basic Metabolic Panel: Recent Labs  Lab 08/09/20 1110  NA 140  K 3.7  CL 100  CO2 29  GLUCOSE 171*  BUN 25*  CREATININE 0.64  CALCIUM 9.4   GFR: Estimated Creatinine Clearance: 32.9 mL/min (by C-G formula based on SCr of 0.64 mg/dL). Liver Function Tests: No results for input(s): AST, ALT, ALKPHOS, BILITOT, PROT, ALBUMIN in the last 168 hours. No results for input(s): LIPASE, AMYLASE in the last 168 hours. No results for input(s): AMMONIA in the last 168 hours. Coagulation Profile: Recent Labs  Lab 08/09/20 1110  INR 0.9   Cardiac Enzymes: No results for input(s): CKTOTAL, CKMB, CKMBINDEX, TROPONINI in the last 168 hours. BNP (last 3 results) No results for input(s): PROBNP in the last 8760 hours. HbA1C: No results for input(s): HGBA1C in the last 72 hours. CBG: No results for input(s): GLUCAP in the last 168 hours. Lipid Profile: No results for input(s): CHOL, HDL, LDLCALC, TRIG, CHOLHDL, LDLDIRECT in the last 72 hours. Thyroid Function Tests: No results for input(s): TSH, T4TOTAL, FREET4, T3FREE, THYROIDAB in the last 72 hours. Anemia Panel: No results for input(s): VITAMINB12, FOLATE, FERRITIN, TIBC, IRON, RETICCTPCT in the last 72 hours. Urine analysis:    Component Value Date/Time   COLORURINE YELLOW 06/30/2018 0350   APPEARANCEUR CLEAR 06/30/2018 0350   LABSPEC 1.016 06/30/2018 0350   PHURINE 5.0 06/30/2018 0350   GLUCOSEU NEGATIVE 06/30/2018 0350   HGBUR NEGATIVE 06/30/2018 0350   BILIRUBINUR NEGATIVE 06/30/2018 0350   KETONESUR NEGATIVE 06/30/2018 0350   PROTEINUR NEGATIVE 06/30/2018 0350   NITRITE NEGATIVE 06/30/2018 0350   LEUKOCYTESUR NEGATIVE 06/30/2018 0350    Radiological Exams on Admission: DG Knee Complete 4 Views Left  Result Date: 08/09/2020 CLINICAL DATA:  Left knee pain after falling in the bathroom this morning. EXAM: LEFT KNEE - COMPLETE 4+ VIEW COMPARISON:  None. FINDINGS: There is an acute comminuted  fracture of the distal left femur which involves the transcondylar axis and extends to the articular surface of the lateral condyle. There is approximately 1.3 cm lateral displacement of the lateral femoral condyle. There is a moderate lipohemarthrosis. IMPRESSION: Comminuted, intra-articular distal left femoral fracture. Electronically Signed   By: Romona Curls M.D.   On: 08/09/2020 12:13   DG Hip Unilat With Pelvis 2-3 Views Left  Result Date: 08/09/2020 CLINICAL DATA:  Left knee pain after falling in the bathroom this morning. EXAM: DG HIP (WITH OR WITHOUT PELVIS) 2-3V LEFT COMPARISON:  Intraoperative left hip fluoroscopy images dated 06/30/2018. FINDINGS: The patient is status post intramedullary nail and femoral neck screw bilaterally. No evidence of hardware failure. There is no evidence of hip fracture or dislocation. Degenerative changes are seen in the spine. IMPRESSION: No acute osseous injury. Electronically Signed   By: Romona Curls M.D.   On: 08/09/2020 12:08    EKG: Independently reviewed. NSR, no st elevations  Assessment/Plan Left femur fracture Fall     -  admit to inpt, med surg     - ortho consulted, appreciate assistance     - continue knee immobilizer     - pain control     - can have diet now, but NPO pMN     - PT/OT after procedure  Leukocytosis     - likely reactive, no fever, follow  Hyperglycemia     - no history of DM     - check A1c  HTN     - no previous history; likely d/t pain     - pain control, PRN anti-hypertensives for now  Osteoporosis     - continue home regimen  DVT prophylaxis: lovenox  Code Status: DNR  Family Communication: w/ family friend at bedside  Consults called: EDP spoke with ortho (Dr Shon Baton)   Status is: Inpatient  Remains inpatient appropriate because:Inpatient level of care appropriate due to severity of illness  Dispo: The patient is from: Home              Anticipated d/c is to:  TBD              Patient currently is  not medically stable to d/c.   Difficult to place patient No  Time spent coordinating admission: 60 minutes  Deema Juncaj A Illya Gienger DO [facility]  If 7PM-7AM, please contact night-coverage [email or web address]  08/09/2020, 1:06 PM

## 2020-08-09 NOTE — Progress Notes (Signed)
Pt stable on arrival to floor. No needs at time of arrival to floor.

## 2020-08-09 NOTE — Progress Notes (Signed)
Dr. Ronaldo Miyamoto notified via amion that pt will not have surgery until Monday or Tuesday per Dr. Shon Baton. Pt remains stable though sleepy (but Rn able to wake pt up easily). Rn will continue to monitor.

## 2020-08-09 NOTE — Progress Notes (Signed)
Orthopedic Tech Progress Note Patient Details:  MAI LONGNECKER 12/15/1927 [id number]   Ortho Devices Type of Ortho Device: Knee Immobilizer Ortho Device/Splint Location: LLE Ortho Device/Splint Interventions: Application, Adjustment   Post Interventions Patient Tolerated: Well Instructions Provided: Adjustment of device  Keoni Risinger Carmine Savoy 08/09/2020, 1:51 PM

## 2020-08-09 NOTE — H&P (View-Only) (Signed)
Chief Complaint: Chemical fall with left distal femur fracture History: Patient presents via EMS after a fall.  Patient notes history of multiple prior falls including both hips having previously been fractured.  Today she was in her usual state of health, fell, striking her left knee.  Since that time she has had severe pain in the left knee, worse with motion.  She denies substantial trauma to her head, back, denies any pain in these areas.  She denies any distal weakness or loss of sensation left leg, but is unwilling to move the leg secondary to pain.   EMS reports no hemodynamic instability in route, patient awake and alert.  Review of systems: No nausea/vomiting/diarrhea.  No loss of consciousness blurry vision or headaches.  Past Medical History:  Diagnosis Date   Osteoporosis     No Known Allergies  No current facility-administered medications on file prior to encounter.   Current Outpatient Medications on File Prior to Encounter  Medication Sig Dispense Refill   alendronate (FOSAMAX) 70 MG tablet Take 70 mg by mouth once a week. Take with a full glass of water on an empty stomach.     Ascorbic Acid (VITAMIN C PO) Take 1 tablet by mouth daily.     docusate sodium (COLACE) 100 MG capsule Take 1 capsule (100 mg total) by mouth 2 (two) times daily. 20 capsule 0   feeding supplement, ENSURE ENLIVE, (ENSURE ENLIVE) LIQD Take 237 mLs by mouth 2 (two) times daily between meals. 237 mL 12   HYDROcodone-acetaminophen (NORCO/VICODIN) 5-325 MG tablet Take 1-2 tablets by mouth every 4 (four) hours as needed for moderate pain (pain score 4-6). 30 tablet 0   Multiple Vitamin (MULTIVITAMIN) tablet Take 1 tablet by mouth daily.     polyethylene glycol (MIRALAX / GLYCOLAX) packet Take 17 g by mouth daily as needed for mild constipation. 14 each 0   Vitamin D, Ergocalciferol, (DRISDOL) 1.25 MG (50000 UNIT) CAPS capsule Take 50,000 Units by mouth every 7 (seven) days.      Physical  Exam: Vitals:   08/09/20 1600 08/09/20 1715  BP:  (!) 152/78  Pulse: 79 92  Resp: 18 16  Temp:  98 F (36.7 C)  SpO2: 100% 100%   Body mass index is 18.34 kg/m. She is alert and oriented x3. No shortness of breath or chest pain.  Lungs are clear to auscultation Compartments are soft and nontender.  2+ dorsalis pedis/posterior tibialis pulses bilaterally. EHL/tibialis anterior/gastrocnemius are intact with 5/5 motor strength.  Intact sensation to light touch throughout the lower legs. Moderate swelling of the left knee is noted.  Significant pain is noted.  No laceration or abrasion.  No gross deformity.  Knee immobilizer is applied. No complaints in the upper extremity or right lower extremity. Abdomen is soft and nontender.  No loss of bowel bladder control.  No rebound tenderness/distention.  Image: CT KNEE LEFT WO CONTRAST  Result Date: 08/09/2020 CLINICAL DATA:  Evaluate left femur fracture. EXAM: CT OF THE left KNEE WITHOUT CONTRAST TECHNIQUE: Multidetector CT imaging of the left knee was performed according to the standard protocol. Multiplanar CT image reconstructions were also generated. COMPARISON:  Radiographs, same date. FINDINGS: As demonstrated on the radiographs there is a largely longitudinal fracture through the base of the lateral femoral condyle extending to the articular surface at the intercondylar notch. Maximum displacement is 12 mm. The medial femoral condyle is intact. No patella, tibia or fibula fractures. There is a large  associated lipohemarthrosis. Significant underlying osteoporosis. Grossly by CT the cruciate and collateral ligaments are intact. The quadriceps and patellar tendons are intact. Moderate vascular calcifications are noted. IMPRESSION: 1. Largely longitudinal fracture through the base of the lateral femoral condyle extending to the articular surface at the intercondylar notch. Maximum displacement is 12 mm. 2. Large associated lipohemarthrosis. 3. No  other fractures are identified. 4. Significant underlying osteoporosis. Electronically Signed   By: P.  Gallerani M.D.   On: 08/09/2020 13:30   DG Knee Complete 4 Views Left  Result Date: 08/09/2020 CLINICAL DATA:  Left knee pain after falling in the bathroom this morning. EXAM: LEFT KNEE - COMPLETE 4+ VIEW COMPARISON:  None. FINDINGS: There is an acute comminuted fracture of the distal left femur which involves the transcondylar axis and extends to the articular surface of the lateral condyle. There is approximately 1.3 cm lateral displacement of the lateral femoral condyle. There is a moderate lipohemarthrosis. IMPRESSION: Comminuted, intra-articular distal left femoral fracture. Electronically Signed   By: Tyler  Litton M.D.   On: 08/09/2020 12:13   DG Hip Unilat With Pelvis 2-3 Views Left  Result Date: 08/09/2020 CLINICAL DATA:  Left knee pain after falling in the bathroom this morning. EXAM: DG HIP (WITH OR WITHOUT PELVIS) 2-3V LEFT COMPARISON:  Intraoperative left hip fluoroscopy images dated 06/30/2018. FINDINGS: The patient is status post intramedullary nail and femoral neck screw bilaterally. No evidence of hardware failure. There is no evidence of hip fracture or dislocation. Degenerative changes are seen in the spine. IMPRESSION: No acute osseous injury. Electronically Signed   By: Tyler  Litton M.D.   On: 08/09/2020 12:08    A/P: Anaja is a very pleasant 87-year-old woman who unfortunately had a mechanical fall and now has a minimally displaced distal femur fracture.  Patient has seen Dr. Swinteck my partner in the past and had a total hip replacement.  She currently is neurologically intact, with no signs or symptoms of a compartment syndrome.  I discussed the care with Dr. Swinteck.  Patient will remain in the knee immobilizer and he will evaluate the patient and discuss definitive treatment with the patient for early next week.  Agree with ongoing use of Lovenox for DT prevention.  Patient  can have a regular diet at this time.  Pain medication per medical team.  We will continue to monitor. 

## 2020-08-09 NOTE — ED Triage Notes (Signed)
Pt presents to ED via ems cc fall, left knee pain. Pt states slipping and falling in the bathroom this am. Pt states Left knee pain following the fall. Pt denies hitting her head, loc. Pt denies blood thinners. Respirations equal, unlabored. A&Ox4.

## 2020-08-09 NOTE — ED Provider Notes (Signed)
[facility]-EMERGENCY DEPT Provider Note   CSN: [id number] Arrival date & time: 08/09/20  1021     History Chief Complaint  Patient presents with   Fall   Knee Pain    Christine Jennings is a 87 y.o. female.  HPI Patient presents via EMS after a fall.  Patient notes history of multiple prior falls including both hips having previously been fractured.  Today she was in her usual state of health, fell, striking her left knee.  Since that time she has had severe pain in the left knee, worse with motion.  She denies substantial trauma to her head, back, denies any pain in these areas.  She denies any distal weakness or loss of sensation left leg, but is unwilling to move the leg secondary to pain.  EMS reports no hemodynamic instability in route, patient awake and alert.    Past Medical History:  Diagnosis Date   Osteoporosis     Patient Active Problem List   Diagnosis Date Noted   Femur fracture (HCC) 08/09/2020   Closed comminuted intertrochanteric fracture of left femur (HCC) 06/30/2018   Intertrochanteric fracture of left hip (HCC) 06/29/2018   Anemia 06/29/2018   Hyperglycemia 06/29/2018   Protein-calorie malnutrition, severe 06/28/2017   Hip fracture (HCC) 06/25/2017   Closed displaced intertrochanteric fracture of right femur (HCC) 06/25/2017    Past Surgical History:  Procedure Laterality Date   ABDOMINAL SURGERY     Cyst Removal    INTRAMEDULLARY (IM) NAIL INTERTROCHANTERIC Right 06/25/2017   Procedure: INTRAMEDULLARY (IM) NAIL INTERTROCHANTRIC;  Surgeon: Samson Frederic, MD;  Location: [facility];  Service: Orthopedics;  Laterality: Right;   INTRAMEDULLARY (IM) NAIL INTERTROCHANTERIC Left 06/30/2018   Procedure: INTRAMEDULLARY (IM) NAIL INTERTROCHANTRIC;  Surgeon: Samson Frederic, MD;  Location: [facility] OR;  Service: Orthopedics;  Laterality: Left;   Left Breast Surgery Left      OB History   No obstetric history on file.     Family History   Problem Relation Age of Onset   Heart disease Father     Social History   Tobacco Use   Smoking status: Never   Smokeless tobacco: Never  Substance Use Topics   Alcohol use: Never   Drug use: Never    Home Medications Prior to Admission medications   Medication Sig Start Date End Date Taking? Authorizing Provider  Ascorbic Acid (VITAMIN C PO) Take 1 tablet by mouth daily.    [provider]  Cholecalciferol (VITAMIN D PO) Take 1 tablet by mouth daily.    [provider]  docusate sodium (COLACE) 100 MG capsule Take 1 capsule (100 mg total) by mouth 2 (two) times daily. Patient not taking: Reported on 06/29/2018 06/28/17   Glade Lloyd, MD  feeding supplement, ENSURE ENLIVE, (ENSURE ENLIVE) LIQD Take 237 mLs by mouth 2 (two) times daily between meals. Patient not taking: Reported on 06/29/2018 06/28/17   Glade Lloyd, MD  HYDROcodone-acetaminophen (NORCO/VICODIN) 5-325 MG tablet Take 1-2 tablets by mouth every 4 (four) hours as needed for moderate pain (pain score 4-6). 07/01/18   Swinteck, Arlys John, MD  Multiple Vitamin (MULTIVITAMIN) tablet Take 1 tablet by mouth daily.    [provider]  polyethylene glycol (MIRALAX / GLYCOLAX) packet Take 17 g by mouth daily as needed for mild constipation. Patient not taking: Reported on 06/29/2018 06/28/17   Glade Lloyd, MD    Allergies    Patient has no known allergies.  Review of Systems   Review of Systems  Constitutional:        Per HPI, otherwise negative  HENT:         Per HPI, otherwise negative  Respiratory:         Per HPI, otherwise negative  Cardiovascular:        Per HPI, otherwise negative  Gastrointestinal:  Negative for vomiting.  Endocrine:       Negative aside from HPI  Genitourinary:        Neg aside from HPI   Musculoskeletal:        Per HPI, otherwise negative  Skin: Negative.   Neurological:  Negative for syncope.   Physical Exam Updated Vital Signs BP (!) 150/79 (BP Location:  Right Arm)   Pulse 82   Temp 98 F (36.7 C) (Oral)   Resp 19   Ht 5' (1.524 m)   Wt 42.6 kg   SpO2 100%   BMI 18.34 kg/m   Physical Exam Vitals and nursing note reviewed.  Constitutional:      General: She is not in acute distress.    Appearance: She is well-developed.     Comments: Frail-appearing elderly female  HENT:     Head: Normocephalic and atraumatic.  Eyes:     Conjunctiva/sclera: Conjunctivae normal.  Cardiovascular:     Rate and Rhythm: Normal rate and regular rhythm.  Pulmonary:     Effort: Pulmonary effort is normal. No respiratory distress.     Breath sounds: Normal breath sounds. No stridor.  Abdominal:     General: There is no distension.  Musculoskeletal:     Comments: Pelvis stable, patient moves right leg unremarkably.  Left knee with deformity, proximal swelling, though she can bend it minimally, she is hesitant to do so secondary to pain.  Patient unwilling to extend the knee when held in passive elevation secondary to pain.  Left ankle unremarkable.  Skin:    General: Skin is warm and dry.  Neurological:     Mental Status: She is alert and oriented to person, place, and time.     Cranial Nerves: No cranial nerve deficit.    ED Results / Procedures / Treatments   Labs (all labs ordered are listed, but only abnormal results are displayed) Labs Reviewed  BASIC METABOLIC PANEL - Abnormal; Notable for the following components:      Result Value   Glucose, Bld 171 (*)    BUN 25 (*)    All other components within normal limits  CBC WITH DIFFERENTIAL/PLATELET - Abnormal; Notable for the following components:   WBC 14.1 (*)    Neutro Abs 12.9 (*)    Lymphs Abs 0.4 (*)    Abs Immature Granulocytes 0.08 (*)    All other components within normal limits  SARS CORONAVIRUS 2 (TAT 6-24 HRS)  PROTIME-INR  VITAMIN D 25 HYDROXY (VIT D DEFICIENCY, FRACTURES)  TYPE AND SCREEN    EKG EKG Interpretation  Date/Time:  Saturday August 09 2020 11:21:50  EDT Ventricular Rate:  76 PR Interval:  170 QRS Duration: 78 QT Interval:  384 QTC Calculation: 432 R Axis:   8 Text Interpretation: Normal sinus rhythm Nonspecific ST and T wave abnormality Artifact Abnormal ECG Confirmed by Gerhard Munch [phone]) on 08/09/2020 11:30:08 AM  Radiology CT KNEE LEFT WO CONTRAST  Result Date: 08/09/2020 CLINICAL DATA:  Evaluate left femur fracture. EXAM: CT OF THE left KNEE WITHOUT CONTRAST TECHNIQUE: Multidetector CT imaging of the left knee was performed according to the standard protocol. Multiplanar CT image reconstructions  were also generated. COMPARISON:  Radiographs, same date. FINDINGS: As demonstrated on the radiographs there is a largely longitudinal fracture through the base of the lateral femoral condyle extending to the articular surface at the intercondylar notch. Maximum displacement is 12 mm. The medial femoral condyle is intact. No patella, tibia or fibula fractures. There is a large associated lipohemarthrosis. Significant underlying osteoporosis. Grossly by CT the cruciate and collateral ligaments are intact. The quadriceps and patellar tendons are intact. Moderate vascular calcifications are noted. IMPRESSION: 1. Largely longitudinal fracture through the base of the lateral femoral condyle extending to the articular surface at the intercondylar notch. Maximum displacement is 12 mm. 2. Large associated lipohemarthrosis. 3. No other fractures are identified. 4. Significant underlying osteoporosis. Electronically Signed   By: Rudie MeyerP.  Gallerani M.D.   On: 08/09/2020 13:30   DG Knee Complete 4 Views Left  Result Date: 08/09/2020 CLINICAL DATA:  Left knee pain after falling in the bathroom this morning. EXAM: LEFT KNEE - COMPLETE 4+ VIEW COMPARISON:  None. FINDINGS: There is an acute comminuted fracture of the distal left femur which involves the transcondylar axis and extends to the articular surface of the lateral condyle. There is approximately 1.3 cm lateral  displacement of the lateral femoral condyle. There is a moderate lipohemarthrosis. IMPRESSION: Comminuted, intra-articular distal left femoral fracture. Electronically Signed   By: Romona Curlsyler  Litton M.D.   On: 08/09/2020 12:13   DG Hip Unilat With Pelvis 2-3 Views Left  Result Date: 08/09/2020 CLINICAL DATA:  Left knee pain after falling in the bathroom this morning. EXAM: DG HIP (WITH OR WITHOUT PELVIS) 2-3V LEFT COMPARISON:  Intraoperative left hip fluoroscopy images dated 06/30/2018. FINDINGS: The patient is status post intramedullary nail and femoral neck screw bilaterally. No evidence of hardware failure. There is no evidence of hip fracture or dislocation. Degenerative changes are seen in the spine. IMPRESSION: No acute osseous injury. Electronically Signed   By: Romona Curlsyler  Litton M.D.   On: 08/09/2020 12:08    Procedures Procedures   Medications Ordered in ED Medications  ondansetron (ZOFRAN) injection 4 mg (4 mg Intravenous Given 08/09/20 1102)  HYDROcodone-acetaminophen (NORCO/VICODIN) 5-325 MG per tablet 1-2 tablet (has no administration in time range)  morphine 2 MG/ML injection 0.5 mg (has no administration in time range)  enoxaparin (LOVENOX) injection 40 mg (40 mg Subcutaneous Given 08/09/20 1536)  docusate sodium (COLACE) capsule 100 mg (has no administration in time range)    ED Course  I have reviewed the triage vital signs and the nursing notes.  Pertinent labs & imaging results that were available during my care of the patient were reviewed by me and considered in my medical decision making (see chart for details).  Update: I discussed the patient's case with our orthopedic colleague, Dr. Shon BatonBrooks, as well as with our internal medicine colleague, Dr. Ronaldo MiyamotoKyle.  On repeat exam the patient is awake, alert, in no distress, accompanied by her pastor.  They are aware of findings, need for admission, [facility]. Final Clinical Impression(s) / ED  Diagnoses Final diagnoses:  Fall, initial encounter  Closed comminuted intra-articular fracture of distal end of left femur, initial encounter [facility](HCC)  MDM Number of Diagnoses or Management Options Closed comminuted intra-articular fracture of distal end of left femur, initial encounter [facility](HCC): new, needed workup Fall, initial encounter: new, needed workup   Amount and/or Complexity of Data Reviewed Clinical lab tests: ordered and reviewed Tests in the radiology section of CPT: ordered and reviewed  Tests in the medicine section of CPT: reviewed and ordered Decide to obtain previous medical records or to obtain history from someone other than the patient: yes Obtain history from someone other than the patient: yes Review and summarize past medical records: yes Discuss the patient with other providers: yes Independent visualization of images, tracings, or specimens: yes  Risk of Complications, Morbidity, and/or Mortality Presenting problems: high Diagnostic procedures: high Management options: high  Critical Care Total time providing critical care: < 30 minutes  Patient Progress Patient progress: stable     Gerhard Munch, MD 08/09/20 1554

## 2020-08-09 NOTE — ED Notes (Signed)
With patient's permission, provided son with update. Son wishes to be contacted with further updates.

## 2020-08-09 NOTE — Consult Note (Signed)
Chief Complaint: Chemical fall with left distal femur fracture History: Patient presents via EMS after a fall.  Patient notes history of multiple prior falls including both hips having previously been fractured.  Today she was in her usual state of health, fell, striking her left knee.  Since that time she has had severe pain in the left knee, worse with motion.  She denies substantial trauma to her head, back, denies any pain in these areas.  She denies any distal weakness or loss of sensation left leg, but is unwilling to move the leg secondary to pain.   EMS reports no hemodynamic instability in route, patient awake and alert.  Review of systems: No nausea/vomiting/diarrhea.  No loss of consciousness blurry vision or headaches.  Past Medical History:  Diagnosis Date   Osteoporosis     No Known Allergies  No current facility-administered medications on file prior to encounter.   Current Outpatient Medications on File Prior to Encounter  Medication Sig Dispense Refill   alendronate (FOSAMAX) 70 MG tablet Take 70 mg by mouth once a week. Take with a full glass of water on an empty stomach.     Ascorbic Acid (VITAMIN C PO) Take 1 tablet by mouth daily.     docusate sodium (COLACE) 100 MG capsule Take 1 capsule (100 mg total) by mouth 2 (two) times daily. 20 capsule 0   feeding supplement, ENSURE ENLIVE, (ENSURE ENLIVE) LIQD Take 237 mLs by mouth 2 (two) times daily between meals. 237 mL 12   HYDROcodone-acetaminophen (NORCO/VICODIN) 5-325 MG tablet Take 1-2 tablets by mouth every 4 (four) hours as needed for moderate pain (pain score 4-6). 30 tablet 0   Multiple Vitamin (MULTIVITAMIN) tablet Take 1 tablet by mouth daily.     polyethylene glycol (MIRALAX / GLYCOLAX) packet Take 17 g by mouth daily as needed for mild constipation. 14 each 0   Vitamin D, Ergocalciferol, (DRISDOL) 1.25 MG (50000 UNIT) CAPS capsule Take 50,000 Units by mouth every 7 (seven) days.      Physical  Exam: Vitals:   08/09/20 1600 08/09/20 1715  BP:  (!) 152/78  Pulse: 79 92  Resp: 18 16  Temp:  98 F (36.7 C)  SpO2: 100% 100%   Body mass index is 18.34 kg/m. She is alert and oriented x3. No shortness of breath or chest pain.  Lungs are clear to auscultation Compartments are soft and nontender.  2+ dorsalis pedis/posterior tibialis pulses bilaterally. EHL/tibialis anterior/gastrocnemius are intact with 5/5 motor strength.  Intact sensation to light touch throughout the lower legs. Moderate swelling of the left knee is noted.  Significant pain is noted.  No laceration or abrasion.  No gross deformity.  Knee immobilizer is applied. No complaints in the upper extremity or right lower extremity. Abdomen is soft and nontender.  No loss of bowel bladder control.  No rebound tenderness/distention.  Image: CT KNEE LEFT WO CONTRAST  Result Date: 08/09/2020 CLINICAL DATA:  Evaluate left femur fracture. EXAM: CT OF THE left KNEE WITHOUT CONTRAST TECHNIQUE: Multidetector CT imaging of the left knee was performed according to the standard protocol. Multiplanar CT image reconstructions were also generated. COMPARISON:  Radiographs, same date. FINDINGS: As demonstrated on the radiographs there is a largely longitudinal fracture through the base of the lateral femoral condyle extending to the articular surface at the intercondylar notch. Maximum displacement is 12 mm. The medial femoral condyle is intact. No patella, tibia or fibula fractures. There is a large  associated lipohemarthrosis. Significant underlying osteoporosis. Grossly by CT the cruciate and collateral ligaments are intact. The quadriceps and patellar tendons are intact. Moderate vascular calcifications are noted. IMPRESSION: 1. Largely longitudinal fracture through the base of the lateral femoral condyle extending to the articular surface at the intercondylar notch. Maximum displacement is 12 mm. 2. Large associated lipohemarthrosis. 3. No  other fractures are identified. 4. Significant underlying osteoporosis. Electronically Signed   By: Rudie Meyer M.D.   On: 08/09/2020 13:30   DG Knee Complete 4 Views Left  Result Date: 08/09/2020 CLINICAL DATA:  Left knee pain after falling in the bathroom this morning. EXAM: LEFT KNEE - COMPLETE 4+ VIEW COMPARISON:  None. FINDINGS: There is an acute comminuted fracture of the distal left femur which involves the transcondylar axis and extends to the articular surface of the lateral condyle. There is approximately 1.3 cm lateral displacement of the lateral femoral condyle. There is a moderate lipohemarthrosis. IMPRESSION: Comminuted, intra-articular distal left femoral fracture. Electronically Signed   By: Romona Curls M.D.   On: 08/09/2020 12:13   DG Hip Unilat With Pelvis 2-3 Views Left  Result Date: 08/09/2020 CLINICAL DATA:  Left knee pain after falling in the bathroom this morning. EXAM: DG HIP (WITH OR WITHOUT PELVIS) 2-3V LEFT COMPARISON:  Intraoperative left hip fluoroscopy images dated 06/30/2018. FINDINGS: The patient is status post intramedullary nail and femoral neck screw bilaterally. No evidence of hardware failure. There is no evidence of hip fracture or dislocation. Degenerative changes are seen in the spine. IMPRESSION: No acute osseous injury. Electronically Signed   By: Romona Curls M.D.   On: 08/09/2020 12:08    A/P: Christine Jennings is a very pleasant 87 year old woman who unfortunately had a mechanical fall and now has a minimally displaced distal femur fracture.  Patient has seen Dr. Linna Caprice my partner in the past and had a total hip replacement.  She currently is neurologically intact, with no signs or symptoms of a compartment syndrome.  I discussed the care with Dr. Linna Caprice.  Patient will remain in the knee immobilizer and he will evaluate the patient and discuss definitive treatment with the patient for early next week.  Agree with ongoing use of Lovenox for DT prevention.  Patient  can have a regular diet at this time.  Pain medication per medical team.  We will continue to monitor.

## 2020-08-10 DIAGNOSIS — S72402A Unspecified fracture of lower end of left femur, initial encounter for closed fracture: Secondary | ICD-10-CM | POA: Diagnosis not present

## 2020-08-10 LAB — CBC
HCT: 37.6 % (ref 36.0–46.0)
Hemoglobin: 12.4 g/dL (ref 12.0–15.0)
MCH: 30.8 pg (ref 26.0–34.0)
MCHC: 33 g/dL (ref 30.0–36.0)
MCV: 93.5 fL (ref 80.0–100.0)
Platelets: 165 10*3/uL (ref 150–400)
RBC: 4.02 MIL/uL (ref 3.87–5.11)
RDW: 12.8 % (ref 11.5–15.5)
WBC: 12.3 10*3/uL — ABNORMAL HIGH (ref 4.0–10.5)
nRBC: 0 % (ref 0.0–0.2)

## 2020-08-10 LAB — BASIC METABOLIC PANEL
Anion gap: 6 (ref 5–15)
BUN: 32 mg/dL — ABNORMAL HIGH (ref 8–23)
CO2: 28 mmol/L (ref 22–32)
Calcium: 8.6 mg/dL — ABNORMAL LOW (ref 8.9–10.3)
Chloride: 100 mmol/L (ref 98–111)
Creatinine, Ser: 0.91 mg/dL (ref 0.44–1.00)
GFR, Estimated: 60 mL/min — ABNORMAL LOW (ref >60)
Glucose, Bld: 177 mg/dL — ABNORMAL HIGH (ref 70–99)
Potassium: 4.1 mmol/L (ref 3.5–5.1)
Sodium: 134 mmol/L — ABNORMAL LOW (ref 135–145)

## 2020-08-10 LAB — VITAMIN D 25 HYDROXY (VIT D DEFICIENCY, FRACTURES): Vit D, 25-Hydroxy: 125 ng/mL — ABNORMAL HIGH (ref 30–100)

## 2020-08-10 MED ORDER — ENSURE ENLIVE PO LIQD
237.0000 mL | Freq: Three times a day (TID) | ORAL | Status: DC
  Administered 2020-08-10 – 2020-08-11 (×3): 237 mL via ORAL

## 2020-08-10 MED ORDER — DEXTROSE-NACL 5-0.9 % IV SOLN: INTRAVENOUS | Status: DC

## 2020-08-10 NOTE — Progress Notes (Signed)
Subjective: Patient resting comfortably in bed in knee immobilizer on left.   Objective: Vital signs in last 24 hours: Temp:  [97.6 F (36.4 C)-99.2 F (37.3 C)] 97.6 F (36.4 C) (08/07 0439) Pulse Rate:  [73-101] 101 (08/07 0908) Resp:  [16-19] 18 (08/07 0908) BP: (101-169)/(71-81) 101/71 (08/07 0908) SpO2:  [98 %-100 %] 99 % (08/07 0908) Weight:  [42.6 kg-49.9 kg] 42.6 kg (08/06 1429)  Intake/Output from previous day: 08/06 0701 - 08/07 0700 In: 520 [P.O.:520] Out: 200 [Urine:200] Intake/Output this shift: Total I/O In: 240 [P.O.:240] Out: 450 [Urine:450]  Recent Labs    08/09/20 1110 08/10/20 0222  HGB 13.7 12.4   Recent Labs    08/09/20 1110 08/10/20 0222  WBC 14.1* 12.3*  RBC 4.45 4.02  HCT 42.0 37.6  PLT 172 165   Recent Labs    08/09/20 1110 08/10/20 0222  NA 140 134*  K 3.7 4.1  CL 100 100  CO2 29 28  BUN 25* 32*  CREATININE 0.64 0.91  GLUCOSE 171* 177*  CALCIUM 9.4 8.6*   Recent Labs    08/09/20 1110  INR 0.9    Dorsiflexion/Plantar flexion intact Brace intact    Assessment/Plan: Left distal femur fracture- Continue knee immobilizer. Dr Linna Caprice to see tomorrow  and determine if surgery is necessary   Christine Jennings 08/10/2020, 9:40 AM

## 2020-08-10 NOTE — Evaluation (Signed)
Clinical/Bedside Swallow Evaluation Patient Details  Name: GENEVE KIMPEL MRN: [id number] Date of Birth: [birth year]  Today's Date: 08/10/2020 Time: SLP Start Time (ACUTE ONLY): 1555 SLP Stop Time (ACUTE ONLY): 1635 SLP Time Calculation (min) (ACUTE ONLY): 40 min  Past Medical History:  Past Medical History:  Diagnosis Date   Osteoporosis    Past Surgical History:  Past Surgical History:  Procedure Laterality Date   ABDOMINAL SURGERY     Cyst Removal    INTRAMEDULLARY (IM) NAIL INTERTROCHANTERIC Right 06/25/2017   Procedure: INTRAMEDULLARY (IM) NAIL INTERTROCHANTRIC;  Surgeon: Samson Frederic, MD;  Location: [facility];  Service: Orthopedics;  Laterality: Right;   INTRAMEDULLARY (IM) NAIL INTERTROCHANTERIC Left 06/30/2018   Procedure: INTRAMEDULLARY (IM) NAIL INTERTROCHANTRIC;  Surgeon: Samson Frederic, MD;  Location: [facility];  Service: Orthopedics;  Laterality: Left;   Left Breast Surgery Left    HPI:  87 year old female with history of previous multiple orthopedic surgery, lives at home alone and walks with walker fell while trying to get up from the toilet and injured her left knee.   Found to have left distal knee fracture.  Orthopedic consulted and admit to the hospital.   Pt noted to have problems swallowing and SLP consult was ordered.  Prior h/o COPD noted per prior imaging of chest.   Assessment / Plan / Recommendation Clinical Impression  She demonstrates clinical indications of oropharyngeal dysphagia with suspected cognitive component. Head/neck hyperextension observed despite verbal/tactile/pillow support to attempt to maintain head in neutral position. This posture can occur in pt's with cognitive deficits.  SLP provided tactile support to maintain neutral position but this was not maintained.   Pt fed self with set up assist including graham cracker, orange sherbert, Ensure, cran-grape juice and water.   Delayed swallowing noted across all consistencies but much more pronounced  with liquids.  Suspect component of both oral and pharyngeal delays.  Multiple audible swallows per bolus observed with liquids - with dry swallows (2nd and 3rd) being intermittently delayed. Cough observed post delayed swallow with thinner liquids more than Ensure.       SLP questions if pt's dysphagia may contribute to her minimal intake. Pt states she "doesn't have an appetite". Ms Strothman demonstrated some difficulty with word finding causing frequent pauses/halting conversation as well as difficulty following directions even when SLP spoke with adequate phonatory strength.     She has no awareness to her dysphagia symptoms causing SLP to suspect she has a chronic dysphagia for which she manages/compensates. Advised her to use extra caution while acutely ill in the hospital given her advanced age, immobility and dysphagia.        Recommend pt continue diet - soft foods and have pt drink Ensure or slightly thicker drinks with meals, thin water between meals.  Advise pt to feed herself to allow her to eat slowly assuring oral clearance before taking next bolus *pt observed to swallow immediately before bringing cup to lips for next liquid bolus*. Continue medication with sherbert for her comfort.        SLP will follow up for education with family/pt re: dysphagia and compensation strategies to mitigate aspiration risk.  Thanks for this consult. SLP Visit Diagnosis: Dysphagia, oropharyngeal phase (R13.12);Dysphagia, unspecified (R13.10)    Aspiration Risk  Moderate aspiration risk    Diet Recommendation Dysphagia 3 (Mech soft);Thin liquid;Other (Comment) (thicker drinks with meals Ensure*)   Liquid Administration via: Cup;No straw Medication Administration: Other (Comment) (w/sherbert) Supervision: Intermittent supervision to cue for compensatory strategies;Patient able  to self feed Compensations: Slow rate;Small sips/bites (head neutral position as able) Postural Changes: Remain upright for at least  30 minutes after po intake;Seated upright at 90 degrees    Other  Recommendations Oral Care Recommendations: Oral care QID   Follow up Recommendations Skilled Nursing facility      Frequency and Duration min 1 x/week  1 week       Prognosis Prognosis for Safe Diet Advancement: Fair Barriers to Reach Goals: Time post onset      Swallow Study   General Date of Onset: 08/10/20 HPI: 87 year old female with history of previous multiple orthopedic surgery, lives at home alone and walks with walker fell while trying to get up from the toilet and injured her left knee.   Found to have left distal knee fracture.  Orthopedic consulted and admit to the hospital.   Pt noted to have problems swallowing and SLP consult was ordered.  Prior h/o COPD noted per prior imaging of chest. Diet Prior to this Study: Regular;Thin liquids Temperature Spikes Noted: No Respiratory Status: Room air History of Recent Intubation: No Behavior/Cognition: Alert;Requires cueing Oral Cavity Assessment: Dry (open mouth posture) Oral Cavity - Dentition: Adequate natural dentition Vision: Functional for self-feeding Self-Feeding Abilities: Needs set up Patient Positioning: Other (comment) (hyperextended neck noted) Baseline Vocal Quality: Low vocal intensity Volitional Cough: Weak Volitional Swallow: Able to elicit    Oral/Motor/Sensory Function Overall Oral Motor/Sensory Function: Within functional limits (hyperextended neck)   Ice Chips Ice chips: Not tested   Thin Liquid Thin Liquid: Impaired Presentation: Cup;Self Fed Oral Phase Impairments: Poor awareness of bolus Oral Phase Functional Implications: Oral holding Pharyngeal  Phase Impairments: Cough - Delayed;Throat Clearing - Immediate;Suspected delayed Swallow;Multiple swallows    Nectar Thick Nectar Thick Liquid: Impaired Oral Phase Impairments: Poor awareness of bolus Oral phase functional implications: Oral holding Pharyngeal Phase Impairments:  Suspected delayed Swallow;Multiple swallows;Throat Clearing - Delayed   Honey Thick Honey Thick Liquid: Not tested   Puree Puree: Impaired Presentation: Self Fed Pharyngeal Phase Impairments: Suspected delayed Swallow;Multiple swallows   Solid     Solid: Impaired Presentation: Self Fed Oral Phase Impairments: Other (comment) (mild prolonged mastication)      Chales Abrahams 08/10/2020,8:10 PM   Rolena Infante, MS [facility] SLP Acute Rehab Services Office [phone] Pager [phone]

## 2020-08-10 NOTE — Progress Notes (Signed)
Patient's Age Advocate Victorino Dike called to check on patient's status. She is available for questions and concerns in regards to history and medications: Victorino Dike [phone].

## 2020-08-10 NOTE — Plan of Care (Signed)

## 2020-08-10 NOTE — Progress Notes (Signed)
PROGRESS NOTE    Christine Jennings  FOY:[id number] DOB: 07/30/1927 DOA: 08/09/2020 PCP: Dois Davenport, MD    Brief Narrative:  87 year old female with history of previous multiple orthopedic surgery, lives at home alone and walks with walker fell while trying to get up from the toilet and injured her left knee.  In the emergency room hemodynamically stable.  Found to have left distal knee fracture.  Orthopedic consulted and admit to the hospital.   Assessment & Plan:   Active Problems:   Femur fracture (HCC)  Closed traumatic left femur fracture: Currently in a splint.  Nonweightbearing.  Adequate pain medications with IV and oral opiates. Orthopedic surgery on board, deciding about surgical intervention versus conservative management.  Continue knee immobilizer.  Osteoporosis: On Fosamax and vitamin D.  Resume.   DVT prophylaxis: enoxaparin (LOVENOX) injection 30 mg Start: 08/10/20 1600   Code Status: DNR Family Communication: None.  Called patient's son and unable to pick up phone. Disposition Plan: Status is: Inpatient  Remains inpatient appropriate because:Unsafe d/c plan and Inpatient level of care appropriate due to severity of illness  Dispo: The patient is from: Home              Anticipated d/c is to: SNF              Patient currently is not medically stable to d/c.   Difficult to place patient No         Consultants:  Orthopedics  Procedures:  None  Antimicrobials:  None   Subjective: Patient seen and examined.  No pain at rest.  She has not mobilized.  Hard of hearing.  She was not sure whether she will have surgery.  Objective: Vitals:   08/09/20 2128 08/10/20 0109 08/10/20 0439 08/10/20 0908  BP: (!) 144/77 128/76 (!) 142/77 101/71  Pulse: 85 96 (!) 101 (!) 101  Resp: 18 16 17 18   Temp: 99.2 F (37.3 C) 98.2 F (36.8 C) 97.6 F (36.4 C)   TempSrc: Oral Oral Oral   SpO2: 100% 100% 100% 99%  Weight:      Height:         Intake/Output Summary (Last 24 hours) at 08/10/2020 1143 Last data filed at 08/10/2020 10/10/2020 Gross per 24 hour  Intake 760 ml  Output 650 ml  Net 110 ml   Filed Weights   08/09/20 1028 08/09/20 1429  Weight: 49.9 kg 42.6 kg    Examination:  General: Frail and debilitated.  Age appropriate.  Not in any distress. Cardiovascular: S1-S2 normal.  No added sounds. Respiratory: Bilateral clear.  No added sounds. Gastrointestinal: Soft and nontender.  Bowel sounds present. Ext: Left lower extremity on knee immobilizer.  Distal neurovascular status intact. Neuro: No focal neurological deficits.      Data Reviewed: I have personally reviewed following labs and imaging studies  CBC: Recent Labs  Lab 08/09/20 1110 08/10/20 0222  WBC 14.1* 12.3*  NEUTROABS 12.9*  --   HGB 13.7 12.4  HCT 42.0 37.6  MCV 94.4 93.5  PLT 172 165   Basic Metabolic Panel: Recent Labs  Lab 08/09/20 1110 08/10/20 0222  NA 140 134*  K 3.7 4.1  CL 100 100  CO2 29 28  GLUCOSE 171* 177*  BUN 25* 32*  CREATININE 0.64 0.91  CALCIUM 9.4 8.6*   GFR: Estimated Creatinine Clearance: 27.1 mL/min (by C-G formula based on SCr of 0.91 mg/dL). Liver Function Tests: No results for input(s): AST, ALT, ALKPHOS, BILITOT, PROT,  ALBUMIN in the last 168 hours. No results for input(s): LIPASE, AMYLASE in the last 168 hours. No results for input(s): AMMONIA in the last 168 hours. Coagulation Profile: Recent Labs  Lab 08/09/20 1110  INR 0.9   Cardiac Enzymes: No results for input(s): CKTOTAL, CKMB, CKMBINDEX, TROPONINI in the last 168 hours. BNP (last 3 results) No results for input(s): PROBNP in the last 8760 hours. HbA1C: No results for input(s): HGBA1C in the last 72 hours. CBG: No results for input(s): GLUCAP in the last 168 hours. Lipid Profile: No results for input(s): CHOL, HDL, LDLCALC, TRIG, CHOLHDL, LDLDIRECT in the last 72 hours. Thyroid Function Tests: No results for input(s): TSH, T4TOTAL,  FREET4, T3FREE, THYROIDAB in the last 72 hours. Anemia Panel: No results for input(s): VITAMINB12, FOLATE, FERRITIN, TIBC, IRON, RETICCTPCT in the last 72 hours. Sepsis Labs: No results for input(s): PROCALCITON, LATICACIDVEN in the last 168 hours.  Recent Results (from the past 240 hour(s))  SARS CORONAVIRUS 2 (TAT 6-24 HRS) Nasopharyngeal Nasopharyngeal Swab     Status: None   Collection Time: 08/09/20 11:10 AM   Specimen: Nasopharyngeal Swab  Result Value Ref Range Status   SARS Coronavirus 2 NEGATIVE NEGATIVE Final    Comment: (NOTE) SARS-CoV-2 target nucleic acids are NOT DETECTED.  The SARS-CoV-2 RNA is generally detectable in upper and lower respiratory specimens during the acute phase of infection. Negative results do not preclude SARS-CoV-2 infection, do not rule out co-infections with other pathogens, and should not be used as the sole basis for treatment or other patient management decisions. Negative results must be combined with clinical observations, patient history, and epidemiological information. The expected result is Negative.  Fact Sheet for Patients: HairSlick.no  Fact Sheet for Healthcare Providers: quierodirigir.com  This test is not yet approved or cleared by the [place] FDA and  has been authorized for detection and/or diagnosis of SARS-CoV-2 by FDA under an Emergency Use Authorization (EUA). This EUA will remain  in effect (meaning this test can be used) for the duration of the COVID-19 declaration under Se ction 564(b)(1) of the Act, 21 U.S.C. section 360bbb-3(b)(1), unless the authorization is terminated or revoked sooner.  Performed at [facility], [address], [place], [place] [address]          Radiology Studies: CT KNEE LEFT WO CONTRAST  Result Date: 08/09/2020 CLINICAL DATA:  Evaluate left femur fracture. EXAM: CT OF THE left KNEE WITHOUT CONTRAST TECHNIQUE:  Multidetector CT imaging of the left knee was performed according to the standard protocol. Multiplanar CT image reconstructions were also generated. COMPARISON:  Radiographs, same date. FINDINGS: As demonstrated on the radiographs there is a largely longitudinal fracture through the base of the lateral femoral condyle extending to the articular surface at the intercondylar notch. Maximum displacement is 12 mm. The medial femoral condyle is intact. No patella, tibia or fibula fractures. There is a large associated lipohemarthrosis. Significant underlying osteoporosis. Grossly by CT the cruciate and collateral ligaments are intact. The quadriceps and patellar tendons are intact. Moderate vascular calcifications are noted. IMPRESSION: 1. Largely longitudinal fracture through the base of the lateral femoral condyle extending to the articular surface at the intercondylar notch. Maximum displacement is 12 mm. 2. Large associated lipohemarthrosis. 3. No other fractures are identified. 4. Significant underlying osteoporosis. Electronically Signed   By: Rudie Meyer M.D.   On: 08/09/2020 13:30   DG Knee Complete 4 Views Left  Result Date: 08/09/2020 CLINICAL DATA:  Left knee pain after falling  in the bathroom this morning. EXAM: LEFT KNEE - COMPLETE 4+ VIEW COMPARISON:  None. FINDINGS: There is an acute comminuted fracture of the distal left femur which involves the transcondylar axis and extends to the articular surface of the lateral condyle. There is approximately 1.3 cm lateral displacement of the lateral femoral condyle. There is a moderate lipohemarthrosis. IMPRESSION: Comminuted, intra-articular distal left femoral fracture. Electronically Signed   By: Romona Curls M.D.   On: 08/09/2020 12:13   DG Hip Unilat With Pelvis 2-3 Views Left  Result Date: 08/09/2020 CLINICAL DATA:  Left knee pain after falling in the bathroom this morning. EXAM: DG HIP (WITH OR WITHOUT PELVIS) 2-3V LEFT COMPARISON:  Intraoperative  left hip fluoroscopy images dated 06/30/2018. FINDINGS: The patient is status post intramedullary nail and femoral neck screw bilaterally. No evidence of hardware failure. There is no evidence of hip fracture or dislocation. Degenerative changes are seen in the spine. IMPRESSION: No acute osseous injury. Electronically Signed   By: Romona Curls M.D.   On: 08/09/2020 12:08        Scheduled Meds:  docusate sodium  100 mg Oral BID   enoxaparin (LOVENOX) injection  30 mg Subcutaneous Q24H   feeding supplement  237 mL Oral TID BM   Continuous Infusions:  dextrose 5 % and 0.9% NaCl 75 mL/hr at 08/10/20 1017     LOS: 1 day    Time spent: 25 minutes    Dorcas Carrow, MD [facility] Pager [phone]

## 2020-08-10 NOTE — Progress Notes (Addendum)
Patient  prefers to take meds crushed in apple sauce or orange sherbet.

## 2020-08-10 NOTE — Progress Notes (Signed)
Rn contacted md concerning pt poor po intake overall and also concerns for pt swallowing. Pt having trouble swallowing pills at times even crushed in applesauce. There is clear delay and cough present when she swallows even ice cream (or sherbet). This does not happen all the time with thin liquids though there seems to be a delay in swallowing those as well. She reports poor intake at home as well some swallowing difficulty at home.

## 2020-08-10 NOTE — Progress Notes (Signed)
Pt son would like a call from Dr. Linna Caprice after he rounds on pt to discuss plan of care.

## 2020-08-10 NOTE — Progress Notes (Signed)
SLP swallow evaluation completed.  Full report to follow.  She demonstrates clinical indications of oropharyngeal dysphagia with suspected cognitive component. Head/neck hyperextension observed despite verbal/tactile/pillow support to attempt to maintain head in neutral position. This posture can occur in pt's with cognitive deficits.  SLP provided tactile support to maintain neutral position but this was not maintained.   Pt fed self with set up assist including graham cracker, orange sherbert, Ensure, cran-grape juice and water.   Delayed swallowing noted across all consistencies but much more pronounced with liquids.  Suspect component of both oral and pharyngeal delays.  Multiple audible swallows per bolus observed with liquids - with dry swallows (2nd and 3rd) being intermittently delayed. Cough observed post delayed swallow with thinner liquids more than Ensure.    SLP questions if pt's dysphagia may contribute to her minimal intake. Pt states she "doesn't have an appetite". Ms Mannes demonstrated some difficulty with word finding causing frequent pauses/halting conversation as well as difficulty following directions even when SLP spoke with adequate phonatory strength.   She has no awareness to her dysphagia symptoms causing SLP to suspect she has a chronic dysphagia for which she manages/compensates. Advised her to use extra caution while acutely ill in the hospital given her advanced age, immobility and dysphagia.     Recommend pt continue diet - soft foods and have pt drink Ensure or slightly thicker drinks with meals, thin water between meals.  Advise pt to feed herself to allow her to eat slowly assuring oral clearance before taking next bolus *pt observed to swallow immediately before bringing cup to lips for next liquid bolus*. Continue medication with sherbert for her comfort.     SLP will follow up for education with family/pt re: dysphagia and compensation strategies to mitigate aspiration  risk.  Thanks for this consult.    Rolena Infante, MS [facility] SLP Acute Rehab Services Office [phone] Pager [phone]

## 2020-08-10 NOTE — Plan of Care (Signed)
°  Problem: Clinical Measurements: °Goal: Respiratory complications will improve °Outcome: Progressing °  °Problem: Clinical Measurements: °Goal: Cardiovascular complication will be avoided °Outcome: Progressing °  °Problem: Pain Managment: °Goal: General experience of comfort will improve °Outcome: Progressing °  °Problem: Safety: °Goal: Ability to remain free from injury will improve °Outcome: Progressing °  °

## 2020-08-11 ENCOUNTER — Inpatient Hospital Stay (HOSPITAL_COMMUNITY): Payer: Medicare Other

## 2020-08-11 DIAGNOSIS — R079 Chest pain, unspecified: Secondary | ICD-10-CM | POA: Diagnosis not present

## 2020-08-11 DIAGNOSIS — S72402A Unspecified fracture of lower end of left femur, initial encounter for closed fracture: Secondary | ICD-10-CM | POA: Diagnosis not present

## 2020-08-11 LAB — URINALYSIS, ROUTINE W REFLEX MICROSCOPIC
Bilirubin Urine: NEGATIVE
Glucose, UA: 50 mg/dL — AB
Hgb urine dipstick: NEGATIVE
Ketones, ur: NEGATIVE mg/dL
Leukocytes,Ua: NEGATIVE
Nitrite: NEGATIVE
Protein, ur: NEGATIVE mg/dL
Specific Gravity, Urine: 1.02 (ref 1.005–1.030)
pH: 5 (ref 5.0–8.0)

## 2020-08-11 LAB — ECHOCARDIOGRAM COMPLETE
Height: 60 in
P 1/2 time: 496 msec
S' Lateral: 1.6 cm
Weight: 1502.66 oz

## 2020-08-11 LAB — TSH: TSH: 1.946 u[IU]/mL (ref 0.350–4.500)

## 2020-08-11 MED ORDER — SODIUM CHLORIDE 0.9 % IV BOLUS
1000.0000 mL | Freq: Once | INTRAVENOUS | Status: AC
  Administered 2020-08-11: 1000 mL via INTRAVENOUS

## 2020-08-11 MED ORDER — BOOST / RESOURCE BREEZE PO LIQD CUSTOM
1.0000 | ORAL | Status: DC
  Administered 2020-08-11 – 2020-08-17 (×5): 1 via ORAL

## 2020-08-11 MED ORDER — ENSURE ENLIVE PO LIQD
237.0000 mL | Freq: Two times a day (BID) | ORAL | Status: DC
  Administered 2020-08-11 – 2020-08-17 (×11): 237 mL via ORAL

## 2020-08-11 MED ORDER — ADULT MULTIVITAMIN W/MINERALS CH
1.0000 | ORAL_TABLET | Freq: Every day | ORAL | Status: DC
  Administered 2020-08-11 – 2020-08-12 (×2): 1 via ORAL
  Filled 2020-08-11 (×3): qty 1

## 2020-08-11 NOTE — Progress Notes (Signed)
  Speech Language Pathology Treatment: Dysphagia  Patient Details Name: Christine Jennings MRN: [id number] DOB: November 23, 1927 Today's Date: 08/11/2020 Time: 1015-1040 SLP Time Calculation (min) (ACUTE ONLY): 25 min  Assessment / Plan / Recommendation Clinical Impression  Patient seen by SLP for dysphagia tx. Upon SLP entering room, patient alert, sitting up in bed and drinking Ensure shake. SLP observed her with cup of gelatin and she exhibited mild oral holding and prolonged bolus formation but no overt s/s aspiration or penetration and with full clearance of oral cavity. She then consumed scrambled eggs, pieces of soft bread toast and hot tea. She continued with prolonged mastication, delayed initiation of swallow but no overt s/s aspiration or penetration. Voice remained clear and oxygen saturation remained at 96-97%. Swallow initiation was more timely with straw sips as opposed to cup. SLP will continue follow patient to ensure toleration or current PO diet.   HPI HPI: 87 year old female with history of previous multiple orthopedic surgery, lives at home alone and walks with walker fell while trying to get up from the toilet and injured her left knee.   Found to have left distal knee fracture.  Orthopedic consulted and admit to the hospital.   Pt noted to have problems swallowing and SLP consult was ordered.  Prior h/o COPD noted per prior imaging of chest.      SLP Plan  Continue with current plan of care       Recommendations  Diet recommendations: Dysphagia 3 (mechanical soft);Thin liquid Liquids provided via: Cup;Straw Medication Administration: Other (Comment) (with sherbet) Supervision: Patient able to self feed;Intermittent supervision to cue for compensatory strategies Compensations: Small sips/bites;Slow rate Postural Changes and/or Swallow Maneuvers: Seated upright 90 degrees                Oral Care Recommendations: Oral care QID Follow up Recommendations: Skilled Nursing  facility SLP Visit Diagnosis: Dysphagia, oropharyngeal phase (R13.12);Dysphagia, unspecified (R13.10) Plan: Continue with current plan of care       Angela Nevin, MA, CCC-SLP Speech Therapy

## 2020-08-11 NOTE — Progress Notes (Signed)
  Echocardiogram 2D Echocardiogram has been performed.  Christine Jennings 08/11/2020, 3:31 PM

## 2020-08-11 NOTE — Progress Notes (Signed)
PROGRESS NOTE    PIERA DOWNS  IWL:[id number] DOB: [birth year] DOA: 08/09/2020 PCP: Dois Davenport, MD    Brief Narrative:  87 year old female with history of previous multiple orthopedic surgery, lives at home alone and walks with walker fell while trying to get up from the toilet and injured her left knee.  In the emergency room hemodynamically stable.  Found to have left distal femur fracture.  Orthopedic consulted and admit to the hospital.   Assessment & Plan:   Active Problems:   Femur fracture (HCC)  Closed traumatic left femur fracture: Currently in splint.  Nonweightbearing.  Adequate pain medications with IV and oral opiates. Orthopedic surgery on board, deciding about surgical intervention versus conservative management.  Continue knee immobilizer.  Osteoporosis: On Fosamax and vitamin D.  Resume on discharge.  Dysphagia: Seen by speech therapy.  Remains on dysphagia 3 diet with aspiration precautions.  Sinus tachycardia: Cause unknown.  TSH normal.  No medication withdrawals.  We will check 2D echocardiogram as she is also potentially going for surgery.   DVT prophylaxis: enoxaparin (LOVENOX) injection 30 mg Start: 08/10/20 1600   Code Status: DNR Family Communication: Patient's local guardian Stormy Card on the phone.  Her son lives in [place]. Disposition Plan: Status is: Inpatient  Remains inpatient appropriate because:Unsafe d/c plan and Inpatient level of care appropriate due to severity of illness  Dispo: The patient is from: Home              Anticipated d/c is to: SNF              Patient currently is not medically stable to d/c.   Difficult to place patient No         Consultants:  Orthopedics  Procedures:  None  Antimicrobials:  None   Subjective: Patient seen and examined.  No new events.  Patient herself denies any complaints.  She was wondering about surgery.  Her local guardian was on the phone and we discussed about  potential surgery and waiting for surgical decision.  It hurts to move. Objective: Vitals:   08/11/20 0619 08/11/20 0730 08/11/20 0812 08/11/20 1110  BP: 94/72 92/66  102/60  Pulse: 73 (!) 164 (!) 108 (!) 105  Resp: 18 16  16   Temp: 98.6 F (37 C) 99.2 F (37.3 C)  98.4 F (36.9 C)  TempSrc: Oral Oral    SpO2: 99% 99%  100%  Weight:      Height:        Intake/Output Summary (Last 24 hours) at 08/11/2020 1125 Last data filed at 08/11/2020 0600 Gross per 24 hour  Intake 1477.41 ml  Output 550 ml  Net 927.41 ml   Filed Weights   08/09/20 1028 08/09/20 1429  Weight: 49.9 kg 42.6 kg    Examination:  General: Frail and debilitated.  Age appropriate.  Not in any distress. Cardiovascular: S1-S2 normal.  No added sounds. Respiratory: Bilateral clear.  No added sounds. Gastrointestinal: Soft and nontender.  Bowel sounds present. Ext: Left lower extremity on knee immobilizer.  Distal neurovascular status intact. Neuro: No focal neurological deficits.      Data Reviewed: I have personally reviewed following labs and imaging studies  CBC: Recent Labs  Lab 08/09/20 1110 08/10/20 0222  WBC 14.1* 12.3*  NEUTROABS 12.9*  --   HGB 13.7 12.4  HCT 42.0 37.6  MCV 94.4 93.5  PLT 172 165   Basic Metabolic Panel: Recent Labs  Lab 08/09/20 1110 08/10/20 0222  NA  140 134*  K 3.7 4.1  CL 100 100  CO2 29 28  GLUCOSE 171* 177*  BUN 25* 32*  CREATININE 0.64 0.91  CALCIUM 9.4 8.6*   GFR: Estimated Creatinine Clearance: 27.1 mL/min (by C-G formula based on SCr of 0.91 mg/dL). Liver Function Tests: No results for input(s): AST, ALT, ALKPHOS, BILITOT, PROT, ALBUMIN in the last 168 hours. No results for input(s): LIPASE, AMYLASE in the last 168 hours. No results for input(s): AMMONIA in the last 168 hours. Coagulation Profile: Recent Labs  Lab 08/09/20 1110  INR 0.9   Cardiac Enzymes: No results for input(s): CKTOTAL, CKMB, CKMBINDEX, TROPONINI in the last 168 hours. BNP  (last 3 results) No results for input(s): PROBNP in the last 8760 hours. HbA1C: No results for input(s): HGBA1C in the last 72 hours. CBG: No results for input(s): GLUCAP in the last 168 hours. Lipid Profile: No results for input(s): CHOL, HDL, LDLCALC, TRIG, CHOLHDL, LDLDIRECT in the last 72 hours. Thyroid Function Tests: Recent Labs    08/11/20 0835  TSH 1.946   Anemia Panel: No results for input(s): VITAMINB12, FOLATE, FERRITIN, TIBC, IRON, RETICCTPCT in the last 72 hours. Sepsis Labs: No results for input(s): PROCALCITON, LATICACIDVEN in the last 168 hours.  Recent Results (from the past 240 hour(s))  SARS CORONAVIRUS 2 (TAT 6-24 HRS) Nasopharyngeal Nasopharyngeal Swab     Status: None   Collection Time: 08/09/20 11:10 AM   Specimen: Nasopharyngeal Swab  Result Value Ref Range Status   SARS Coronavirus 2 NEGATIVE NEGATIVE Final    Comment: (NOTE) SARS-CoV-2 target nucleic acids are NOT DETECTED.  The SARS-CoV-2 RNA is generally detectable in upper and lower respiratory specimens during the acute phase of infection. Negative results do not preclude SARS-CoV-2 infection, do not rule out co-infections with other pathogens, and should not be used as the sole basis for treatment or other patient management decisions. Negative results must be combined with clinical observations, patient history, and epidemiological information. The expected result is Negative.  Fact Sheet for Patients: HairSlick.no  Fact Sheet for Healthcare Providers: quierodirigir.com  This test is not yet approved or cleared by the [place] FDA and  has been authorized for detection and/or diagnosis of SARS-CoV-2 by FDA under an Emergency Use Authorization (EUA). This EUA will remain  in effect (meaning this test can be used) for the duration of the COVID-19 declaration under Se ction 564(b)(1) of the Act, 21 U.S.C. section 360bbb-3(b)(1),  unless the authorization is terminated or revoked sooner.  Performed at [facility], [address] [place], [place] [address]          Radiology Studies: CT KNEE LEFT WO CONTRAST  Result Date: 08/09/2020 CLINICAL DATA:  Evaluate left femur fracture. EXAM: CT OF THE left KNEE WITHOUT CONTRAST TECHNIQUE: Multidetector CT imaging of the left knee was performed according to the standard protocol. Multiplanar CT image reconstructions were also generated. COMPARISON:  Radiographs, same date. FINDINGS: As demonstrated on the radiographs there is a largely longitudinal fracture through the base of the lateral femoral condyle extending to the articular surface at the intercondylar notch. Maximum displacement is 12 mm. The medial femoral condyle is intact. No patella, tibia or fibula fractures. There is a large associated lipohemarthrosis. Significant underlying osteoporosis. Grossly by CT the cruciate and collateral ligaments are intact. The quadriceps and patellar tendons are intact. Moderate vascular calcifications are noted. IMPRESSION: 1. Largely longitudinal fracture through the base of the lateral femoral condyle extending to the articular surface  at the intercondylar notch. Maximum displacement is 12 mm. 2. Large associated lipohemarthrosis. 3. No other fractures are identified. 4. Significant underlying osteoporosis. Electronically Signed   By: Rudie Meyer M.D.   On: 08/09/2020 13:30   DG Knee Complete 4 Views Left  Result Date: 08/09/2020 CLINICAL DATA:  Left knee pain after falling in the bathroom this morning. EXAM: LEFT KNEE - COMPLETE 4+ VIEW COMPARISON:  None. FINDINGS: There is an acute comminuted fracture of the distal left femur which involves the transcondylar axis and extends to the articular surface of the lateral condyle. There is approximately 1.3 cm lateral displacement of the lateral femoral condyle. There is a moderate lipohemarthrosis. IMPRESSION: Comminuted,  intra-articular distal left femoral fracture. Electronically Signed   By: Romona Curls M.D.   On: 08/09/2020 12:13   DG Hip Unilat With Pelvis 2-3 Views Left  Result Date: 08/09/2020 CLINICAL DATA:  Left knee pain after falling in the bathroom this morning. EXAM: DG HIP (WITH OR WITHOUT PELVIS) 2-3V LEFT COMPARISON:  Intraoperative left hip fluoroscopy images dated 06/30/2018. FINDINGS: The patient is status post intramedullary nail and femoral neck screw bilaterally. No evidence of hardware failure. There is no evidence of hip fracture or dislocation. Degenerative changes are seen in the spine. IMPRESSION: No acute osseous injury. Electronically Signed   By: Romona Curls M.D.   On: 08/09/2020 12:08        Scheduled Meds:  docusate sodium  100 mg Oral BID   enoxaparin (LOVENOX) injection  30 mg Subcutaneous Q24H   feeding supplement  237 mL Oral TID BM   Continuous Infusions:  dextrose 5 % and 0.9% NaCl 75 mL/hr at 08/10/20 2341     LOS: 2 days    Time spent: 25 minutes    Dorcas Carrow, MD [facility] Pager [phone]

## 2020-08-11 NOTE — Progress Notes (Signed)
Initial Nutrition Assessment  DOCUMENTATION CODES:   Severe malnutrition in context of chronic illness, Underweight  INTERVENTION:  - will order Boost Breeze once/day, each supplement provides 250 kcal and 9 grams of protein. - will order Ensure Enlive BID, each supplement provides 350 kcal and 20 grams of protein. - will order 1 tablet multivitamin with minerals/day.    NUTRITION DIAGNOSIS:   Severe Malnutrition related to chronic illness (osteoporosis) as evidenced by severe fat depletion, severe muscle depletion.  GOAL:   Patient will meet greater than or equal to 90% of their needs  MONITOR:   PO intake, Supplement acceptance, Labs, Weight trends  REASON FOR ASSESSMENT:   Consult Hip fracture protocol  ASSESSMENT:   87 year old female with medical history of previous multiple orthopedic surgeries and osteoporosis. She lives at home alone and walks with the assistance of a walker. She experienced a fall onto her L side when attempting to stand from the toilet.  Diet advanced from NPO to Regular on 8/6 at 1430 and then changed back to NPO at midnight yesterday and advanced to Dysphagia 3, thin liquids after being seen by SLP for swallow evaluation this AM.   Documented meal intakes are 0% of dinner on 8/6 and 50% of dinner yesterday.   No family or visitors present at the time of visit. Patient remains asleep throughout visit. Breakfast tray in front of her and appears untouched.   Weight on 8/6 was 94 lb and PTA the most recently documented weight was on 07/01/18 when she weighed 118 lb. This indicates 24 lb weight loss (20.3% body weight) in a little over 2 years; not significant for time frame, but unsure if weight loss occurred more acutely.   Ortho saw patient yesterday with plan to follow-up today to determine if surgical intervention will be pursued for L femur fx.   Labs reviewed; Na: 134 mmol/l, BUN: 32 mg/dl, Ca: 8.6 mg/dl, GFR: 60 ml/min. Medications reviewed;  100 mg colace BID. IVF; D5-NS @ 75 ml/hr (306 kcal/24 hrs).    NUTRITION - FOCUSED PHYSICAL EXAM:  Flowsheet Row Most Recent Value  Orbital Region Moderate depletion  Upper Arm Region Severe depletion  Thoracic and Lumbar Region Severe depletion  Buccal Region Severe depletion  Temple Region Moderate depletion  Clavicle Bone Region Severe depletion  Clavicle and Acromion Bone Region Severe depletion  Scapular Bone Region Unable to assess  Dorsal Hand Unable to assess  Patellar Region Unable to assess  Anterior Thigh Region Unable to assess  Posterior Calf Region Unable to assess  Edema (RD Assessment) Unable to assess  Hair Reviewed  Eyes Unable to assess  Mouth Unable to assess  Skin Reviewed  Nails Unable to assess       Diet Order:   Diet Order             DIET DYS 3 Room service appropriate? Yes; Fluid consistency: Thin  Diet effective now                   EDUCATION NEEDS:   No education needs have been identified at this time  Skin:  Skin Assessment: Reviewed RN Assessment  Last BM:  8/6  Height:   Ht Readings from Last 1 Encounters:  08/09/20 5' (1.524 m)    Weight:   Wt Readings from Last 1 Encounters:  08/09/20 42.6 kg      Estimated Nutritional Needs:  Kcal:  1300-1500 kcal Protein:  65-75 grams Fluid:  >/= 1.5 L/day  Lijah Bourque, MS, RD, LDN, CNSC Inpatient Clinical Dietitian RD pager # available in AMION  After hours/weekend pager # available in AMION  

## 2020-08-12 DIAGNOSIS — S72402A Unspecified fracture of lower end of left femur, initial encounter for closed fracture: Secondary | ICD-10-CM | POA: Diagnosis not present

## 2020-08-12 LAB — CBC WITH DIFFERENTIAL/PLATELET
Abs Immature Granulocytes: 0.05 10*3/uL (ref 0.00–0.07)
Basophils Absolute: 0 10*3/uL (ref 0.0–0.1)
Basophils Relative: 0 %
Eosinophils Absolute: 0.1 10*3/uL (ref 0.0–0.5)
Eosinophils Relative: 1 %
HCT: 27.8 % — ABNORMAL LOW (ref 36.0–46.0)
Hemoglobin: 8.7 g/dL — ABNORMAL LOW (ref 12.0–15.0)
Immature Granulocytes: 1 %
Lymphocytes Relative: 16 %
Lymphs Abs: 1 10*3/uL (ref 0.7–4.0)
MCH: 31.1 pg (ref 26.0–34.0)
MCHC: 31.3 g/dL (ref 30.0–36.0)
MCV: 99.3 fL (ref 80.0–100.0)
Monocytes Absolute: 0.6 10*3/uL (ref 0.1–1.0)
Monocytes Relative: 9 %
Neutro Abs: 4.8 10*3/uL (ref 1.7–7.7)
Neutrophils Relative %: 73 %
Platelets: 103 10*3/uL — ABNORMAL LOW (ref 150–400)
RBC: 2.8 MIL/uL — ABNORMAL LOW (ref 3.87–5.11)
RDW: 12.9 % (ref 11.5–15.5)
WBC: 6.6 10*3/uL (ref 4.0–10.5)
nRBC: 0 % (ref 0.0–0.2)

## 2020-08-12 LAB — IRON AND TIBC
Iron: 37 ug/dL (ref 28–170)
Saturation Ratios: 16 % (ref 10.4–31.8)
TIBC: 237 ug/dL — ABNORMAL LOW (ref 250–450)
UIBC: 200 ug/dL

## 2020-08-12 LAB — FERRITIN: Ferritin: 341 ng/mL — ABNORMAL HIGH (ref 11–307)

## 2020-08-12 LAB — VITAMIN B12: Vitamin B-12: 201 pg/mL (ref 180–914)

## 2020-08-12 LAB — SURGICAL PCR SCREEN
MRSA, PCR: NEGATIVE
Staphylococcus aureus: NEGATIVE

## 2020-08-12 NOTE — Progress Notes (Signed)
PROGRESS NOTE    Christine Jennings  KCL:[id number] DOB: 25-Jul-1927 DOA: 08/09/2020 PCP: Dois Davenport, MD    Brief Narrative:  87 year old female with history of previous multiple orthopedic surgery, lives at home alone and walks with walker fell while trying to get up from the toilet and injured her left knee.  In the emergency room hemodynamically stable.  Found to have left distal femur fracture with intra articular extension. Orthopedic consulted and admitted to the hospital.  Assessment & Plan:   Active Problems:   Femur fracture (HCC)  Closed traumatic left distal femur fracture: Currently in splint.  Nonweightbearing.  Adequate pain medications with IV and oral opiates. Orthopedic surgery on board, planned for ORIF tomorrow. Continue knee immobilizer.  Osteoporosis: On Fosamax and vitamin D.  Resume on discharge.  Dysphagia: Seen by speech therapy.  Remains on dysphagia 3 diet with aspiration precautions.  Acute anemia of blood loss: expected from long bone fracture. Hb low at base line.  Will monitor. No indication for blood transfusion. She is ok to receive transfusion if needed.   Sinus tachycardia: likely due to pain and blood loss anemia. TSH normal.  2 D Echo apparently normal. Monitor.   DVT prophylaxis: enoxaparin (LOVENOX) injection 30 mg Start: 08/10/20 1600   Code Status: DNR Family Communication: Son Trey Paula on the phone.  Will like to talk to surgeon about the surgery.  Disposition Plan: Status is: Inpatient  Remains inpatient appropriate because:Unsafe d/c plan and Inpatient level of care appropriate due to severity of illness  Dispo: The patient is from: Home              Anticipated d/c is to: SNF after procedure.              Patient currently is not medically stable to d/c.   Difficult to place patient No  Consultants:  Orthopedics  Procedures:  None  Antimicrobials:  None   Subjective:  Patient seen and examined.  No new events.  Hurts  to move.  She was thinking she is going for surgery today. No other overnight events.  Heart rate remains stable now. She wants me and surgeon to talk to her son.  Objective: Vitals:   08/11/20 1110 08/11/20 1353 08/11/20 2157 08/12/20 0650  BP: 102/60 (!) 121/58 (!) 118/56 140/75  Pulse: (!) 105 95 69 97  Resp: 16 17 18 18   Temp: 98.4 F (36.9 C) 99.7 F (37.6 C) 100.3 F (37.9 C) 98.8 F (37.1 C)  TempSrc:  Oral Oral Oral  SpO2: 100% 100% 99% 100%  Weight:      Height:        Intake/Output Summary (Last 24 hours) at 08/12/2020 1058 Last data filed at 08/12/2020 1000 Gross per 24 hour  Intake 3245.37 ml  Output 1750 ml  Net 1495.37 ml   Filed Weights   08/09/20 1028 08/09/20 1429  Weight: 49.9 kg 42.6 kg    Examination:  General: Frail and debilitated.  Age appropriate.  Not in any distress. Cardiovascular: S1-S2 normal.  No added sounds. Respiratory: Bilateral clear.  No added sounds. Gastrointestinal: Soft and nontender.  Bowel sounds present. Ext: Left lower extremity on knee immobilizer.  Distal neurovascular status intact. Neuro: No focal neurological deficits.      Data Reviewed: I have personally reviewed following labs and imaging studies  CBC: Recent Labs  Lab 08/09/20 1110 08/10/20 0222 08/12/20 0301  WBC 14.1* 12.3* 6.6  NEUTROABS 12.9*  --  4.8  HGB 13.7 12.4 8.7*  HCT 42.0 37.6 27.8*  MCV 94.4 93.5 99.3  PLT 172 165 103*   Basic Metabolic Panel: Recent Labs  Lab 08/09/20 1110 08/10/20 0222  NA 140 134*  K 3.7 4.1  CL 100 100  CO2 29 28  GLUCOSE 171* 177*  BUN 25* 32*  CREATININE 0.64 0.91  CALCIUM 9.4 8.6*   GFR: Estimated Creatinine Clearance: 27.1 mL/min (by C-G formula based on SCr of 0.91 mg/dL). Liver Function Tests: No results for input(s): AST, ALT, ALKPHOS, BILITOT, PROT, ALBUMIN in the last 168 hours. No results for input(s): LIPASE, AMYLASE in the last 168 hours. No results for input(s): AMMONIA in the last 168  hours. Coagulation Profile: Recent Labs  Lab 08/09/20 1110  INR 0.9   Cardiac Enzymes: No results for input(s): CKTOTAL, CKMB, CKMBINDEX, TROPONINI in the last 168 hours. BNP (last 3 results) No results for input(s): PROBNP in the last 8760 hours. HbA1C: No results for input(s): HGBA1C in the last 72 hours. CBG: No results for input(s): GLUCAP in the last 168 hours. Lipid Profile: No results for input(s): CHOL, HDL, LDLCALC, TRIG, CHOLHDL, LDLDIRECT in the last 72 hours. Thyroid Function Tests: Recent Labs    08/11/20 0835  TSH 1.946   Anemia Panel: Recent Labs    08/12/20 0857  VITAMINB12 201  FERRITIN 341*  TIBC 237*  IRON 37   Sepsis Labs: No results for input(s): PROCALCITON, LATICACIDVEN in the last 168 hours.  Recent Results (from the past 240 hour(s))  SARS CORONAVIRUS 2 (TAT 6-24 HRS) Nasopharyngeal Nasopharyngeal Swab     Status: None   Collection Time: 08/09/20 11:10 AM   Specimen: Nasopharyngeal Swab  Result Value Ref Range Status   SARS Coronavirus 2 NEGATIVE NEGATIVE Final    Comment: (NOTE) SARS-CoV-2 target nucleic acids are NOT DETECTED.  The SARS-CoV-2 RNA is generally detectable in upper and lower respiratory specimens during the acute phase of infection. Negative results do not preclude SARS-CoV-2 infection, do not rule out co-infections with other pathogens, and should not be used as the sole basis for treatment or other patient management decisions. Negative results must be combined with clinical observations, patient history, and epidemiological information. The expected result is Negative.  Fact Sheet for Patients: HairSlick.nohttps://www.fda.gov/media/138098/download  Fact Sheet for Healthcare Providers: quierodirigir.comhttps://www.fda.gov/media/138095/download  This test is not yet approved or cleared by the [place] FDA and  has been authorized for detection and/or diagnosis of SARS-CoV-2 by FDA under an Emergency Use Authorization (EUA). This EUA will  remain  in effect (meaning this test can be used) for the duration of the COVID-19 declaration under Se ction 564(b)(1) of the Act, 21 U.S.C. section 360bbb-3(b)(1), unless the authorization is terminated or revoked sooner.  Performed at [facility], [address] [place], [place] [address]          Radiology Studies: ECHOCARDIOGRAM COMPLETE  Result Date: 08/11/2020    ECHOCARDIOGRAM REPORT   Patient Name:   Christine BongoILEEN M Dorian Date of Exam: 08/11/2020 Medical Rec #:  [id number]         Height:       60.0 in Accession #:    [id number]        Weight:       93.9 lb Date of Birth:  10-27-1927         BSA:          1.354 m Patient Age:    [age] years  BP:           102/60 mmHg Patient Gender: F                 HR:           105 bpm. Exam Location:  Inpatient Procedure: 2D Echo, Cardiac Doppler and Color Doppler Indications:    R07.9* Chest pain, unspecified  History:        Patient has prior history of Echocardiogram examinations, most                 recent 01/27/2018.  Sonographer:    Eulah Pont RDCS Referring Phys: [id number] Dorcas Carrow IMPRESSIONS  1. Left ventricular ejection fraction, by estimation, is 70 to 75%. The left ventricle has hyperdynamic function. The left ventricle has no regional wall motion abnormalities. Left ventricular diastolic parameters are consistent with Grade I diastolic dysfunction (impaired relaxation).  2. Right ventricular systolic function is normal. The right ventricular size is normal. There is normal pulmonary artery systolic pressure.  3. The mitral valve is normal in structure. No evidence of mitral valve regurgitation. No evidence of mitral stenosis.  4. The aortic valve is calcified. Aortic valve regurgitation is trivial. No aortic stenosis is present.  5. The inferior vena cava is normal in size with greater than 50% respiratory variability, suggesting right atrial pressure of 3 mmHg. FINDINGS  Left Ventricle: Left ventricular ejection fraction, by  estimation, is 70 to 75%. The left ventricle has hyperdynamic function. The left ventricle has no regional wall motion abnormalities. The left ventricular internal cavity size was normal in size. There is no left ventricular hypertrophy. Left ventricular diastolic parameters are consistent with Grade I diastolic dysfunction (impaired relaxation). Right Ventricle: The right ventricular size is normal. Right ventricular systolic function is normal. There is normal pulmonary artery systolic pressure. The tricuspid regurgitant velocity is 2.42 m/s, and with an assumed right atrial pressure of 3 mmHg,  the estimated right ventricular systolic pressure is 26.4 mmHg. Left Atrium: Left atrial size was normal in size. Right Atrium: Right atrial size was normal in size. Pericardium: There is no evidence of pericardial effusion. Mitral Valve: The mitral valve is normal in structure. Mild mitral annular calcification. No evidence of mitral valve regurgitation. No evidence of mitral valve stenosis. Tricuspid Valve: The tricuspid valve is normal in structure. Tricuspid valve regurgitation is trivial. No evidence of tricuspid stenosis. Aortic Valve: The aortic valve is calcified. Aortic valve regurgitation is trivial. Aortic regurgitation PHT measures 496 msec. No aortic stenosis is present. Pulmonic Valve: The pulmonic valve was normal in structure. Pulmonic valve regurgitation is not visualized. No evidence of pulmonic stenosis. Aorta: The aortic root is normal in size and structure. Venous: The inferior vena cava is normal in size with greater than 50% respiratory variability, suggesting right atrial pressure of 3 mmHg. IAS/Shunts: No atrial level shunt detected by color flow Doppler.  LEFT VENTRICLE PLAX 2D LVIDd:         2.50 cm LVIDs:         1.60 cm LV PW:         1.00 cm LV IVS:        1.10 cm LVOT diam:     2.10 cm LV SV:         83 LV SV Index:   61 LVOT Area:     3.46 cm  RIGHT VENTRICLE RV S prime:     19.50 cm/s  TAPSE (M-mode): 1.6 cm LEFT ATRIUM  Index       RIGHT ATRIUM          Index LA diam:        3.00 cm 2.22 cm/m  RA Area:     8.43 cm LA Vol (A2C):   35.9 ml 26.52 ml/m RA Volume:   13.90 ml 10.27 ml/m LA Vol (A4C):   31.8 ml 23.49 ml/m LA Biplane Vol: 36.6 ml 27.04 ml/m  AORTIC VALVE LVOT Vmax:   130.33 cm/s LVOT Vmean:  87.000 cm/s LVOT VTI:    0.239 m AI PHT:      496 msec  AORTA Ao Root diam: 2.80 cm Ao Asc diam:  2.70 cm TRICUSPID VALVE TR Peak grad:   23.4 mmHg TR Vmax:        242.00 cm/s  SHUNTS Systemic VTI:  0.24 m Systemic Diam: 2.10 cm Olga Millers MD Electronically signed by Olga Millers MD Signature Date/Time: 08/11/2020/3:41:01 PM    Final         Scheduled Meds:  docusate sodium  100 mg Oral BID   enoxaparin (LOVENOX) injection  30 mg Subcutaneous Q24H   feeding supplement  1 Container Oral Q24H   feeding supplement  237 mL Oral BID BM   multivitamin with minerals  1 tablet Oral Daily   Continuous Infusions:  dextrose 5 % and 0.9% NaCl 75 mL/hr at 08/12/20 0150     LOS: 3 days    Time spent: 25 minutes    Dorcas Carrow, MD [facility] Pager [phone]

## 2020-08-12 NOTE — Plan of Care (Signed)
  Problem: Pain Managment: Goal: General experience of comfort will improve Outcome: Progressing   Problem: Activity: Goal: Risk for activity intolerance will decrease Outcome: Progressing   

## 2020-08-13 ENCOUNTER — Encounter (HOSPITAL_COMMUNITY): Payer: Self-pay | Admitting: Internal Medicine

## 2020-08-13 ENCOUNTER — Inpatient Hospital Stay (HOSPITAL_COMMUNITY): Payer: Medicare Other | Admitting: Anesthesiology

## 2020-08-13 ENCOUNTER — Encounter (HOSPITAL_COMMUNITY)
Admission: EM | Disposition: A | Discharge status: Skilled Nursing Facility | Payer: Self-pay | Source: Home / Self Care | Attending: Internal Medicine

## 2020-08-13 ENCOUNTER — Inpatient Hospital Stay (HOSPITAL_COMMUNITY): Payer: Medicare Other

## 2020-08-13 DIAGNOSIS — S72402A Unspecified fracture of lower end of left femur, initial encounter for closed fracture: Secondary | ICD-10-CM | POA: Diagnosis not present

## 2020-08-13 HISTORY — PX: ORIF FEMUR FRACTURE: SHX2119

## 2020-08-13 LAB — CBC
HCT: 38.2 % (ref 36.0–46.0)
Hemoglobin: 12.4 g/dL (ref 12.0–15.0)
MCH: 29.8 pg (ref 26.0–34.0)
MCHC: 32.5 g/dL (ref 30.0–36.0)
MCV: 91.8 fL (ref 80.0–100.0)
Platelets: 139 10*3/uL — ABNORMAL LOW (ref 150–400)
RBC: 4.16 MIL/uL (ref 3.87–5.11)
RDW: 14.9 % (ref 11.5–15.5)
WBC: 9 10*3/uL (ref 4.0–10.5)
nRBC: 0 % (ref 0.0–0.2)

## 2020-08-13 LAB — CBC WITH DIFFERENTIAL/PLATELET
Abs Immature Granulocytes: 0.02 10*3/uL (ref 0.00–0.07)
Basophils Absolute: 0 10*3/uL (ref 0.0–0.1)
Basophils Relative: 1 %
Eosinophils Absolute: 0.2 10*3/uL (ref 0.0–0.5)
Eosinophils Relative: 2 %
HCT: 28.1 % — ABNORMAL LOW (ref 36.0–46.0)
Hemoglobin: 8.9 g/dL — ABNORMAL LOW (ref 12.0–15.0)
Immature Granulocytes: 0 %
Lymphocytes Relative: 19 %
Lymphs Abs: 1.1 10*3/uL (ref 0.7–4.0)
MCH: 30.6 pg (ref 26.0–34.0)
MCHC: 31.7 g/dL (ref 30.0–36.0)
MCV: 96.6 fL (ref 80.0–100.0)
Monocytes Absolute: 0.6 10*3/uL (ref 0.1–1.0)
Monocytes Relative: 10 %
Neutro Abs: 4.2 10*3/uL (ref 1.7–7.7)
Neutrophils Relative %: 68 %
Platelets: 143 10*3/uL — ABNORMAL LOW (ref 150–400)
RBC: 2.91 MIL/uL — ABNORMAL LOW (ref 3.87–5.11)
RDW: 12.9 % (ref 11.5–15.5)
WBC: 6.2 10*3/uL (ref 4.0–10.5)
nRBC: 0 % (ref 0.0–0.2)

## 2020-08-13 LAB — CREATININE, SERUM
Creatinine, Ser: 0.51 mg/dL (ref 0.44–1.00)
GFR, Estimated: >60 mL/min (ref >60)

## 2020-08-13 LAB — PREPARE RBC (CROSSMATCH)

## 2020-08-13 SURGERY — OPEN REDUCTION INTERNAL FIXATION (ORIF) DISTAL FEMUR FRACTURE
Anesthesia: Spinal | Laterality: Left

## 2020-08-13 MED ORDER — SODIUM CHLORIDE 0.9% IV SOLUTION: Freq: Once | INTRAVENOUS | Status: DC

## 2020-08-13 MED ORDER — LIDOCAINE 2% (20 MG/ML) 5 ML SYRINGE
INTRAMUSCULAR | Status: AC
  Filled 2020-08-13: qty 5

## 2020-08-13 MED ORDER — ONDANSETRON HCL 4 MG/2ML IJ SOLN
INTRAMUSCULAR | Status: DC | PRN
  Administered 2020-08-13: 4 mg via INTRAVENOUS

## 2020-08-13 MED ORDER — PROPOFOL 10 MG/ML IV BOLUS
INTRAVENOUS | Status: DC | PRN
  Administered 2020-08-13: 80 mg via INTRAVENOUS

## 2020-08-13 MED ORDER — CHLORHEXIDINE GLUCONATE 4 % EX LIQD
60.0000 mL | Freq: Once | CUTANEOUS | Status: AC
  Administered 2020-08-13: 4 via TOPICAL
  Filled 2020-08-13: qty 60

## 2020-08-13 MED ORDER — CALCIUM CARB-CHOLECALCIFEROL 500-400 MG-UNIT PO TABS: ORAL_TABLET | Freq: Every day | ORAL | Status: DC

## 2020-08-13 MED ORDER — OXYCODONE HCL 5 MG PO TABS: 5.0000 mg | ORAL_TABLET | Freq: Once | ORAL | Status: DC | PRN

## 2020-08-13 MED ORDER — CHLORHEXIDINE GLUCONATE CLOTH 2 % EX PADS: 6.0000 | MEDICATED_PAD | Freq: Every day | CUTANEOUS | Status: DC

## 2020-08-13 MED ORDER — DEXAMETHASONE SODIUM PHOSPHATE 10 MG/ML IJ SOLN
INTRAMUSCULAR | Status: AC
  Filled 2020-08-13: qty 1

## 2020-08-13 MED ORDER — ROCURONIUM BROMIDE 10 MG/ML (PF) SYRINGE
PREFILLED_SYRINGE | INTRAVENOUS | Status: AC
  Filled 2020-08-13: qty 10

## 2020-08-13 MED ORDER — SODIUM CHLORIDE 0.9 % IV SOLN: INTRAVENOUS | Status: DC

## 2020-08-13 MED ORDER — CALCIUM CARBONATE 1250 (500 CA) MG PO TABS
1.0000 | ORAL_TABLET | Freq: Every day | ORAL | Status: DC
  Administered 2020-08-14 – 2020-08-17 (×4): 500 mg via ORAL
  Filled 2020-08-13 (×4): qty 1

## 2020-08-13 MED ORDER — SODIUM CHLORIDE 0.9 % IV SOLN: INTRAVENOUS | Status: DC | PRN

## 2020-08-13 MED ORDER — CHLORHEXIDINE GLUCONATE 0.12 % MT SOLN
15.0000 mL | Freq: Once | OROMUCOSAL | Status: AC
  Administered 2020-08-13: 15 mL via OROMUCOSAL

## 2020-08-13 MED ORDER — LIDOCAINE 2% (20 MG/ML) 5 ML SYRINGE
INTRAMUSCULAR | Status: DC | PRN
  Administered 2020-08-13: 40 mg via INTRAVENOUS

## 2020-08-13 MED ORDER — ONDANSETRON HCL 4 MG/2ML IJ SOLN: 4.0000 mg | Freq: Four times a day (QID) | INTRAMUSCULAR | Status: DC | PRN

## 2020-08-13 MED ORDER — ACETAMINOPHEN 10 MG/ML IV SOLN
1000.0000 mg | Freq: Once | INTRAVENOUS | Status: AC
  Administered 2020-08-13: 1000 mg via INTRAVENOUS

## 2020-08-13 MED ORDER — ROCURONIUM BROMIDE 10 MG/ML (PF) SYRINGE
PREFILLED_SYRINGE | INTRAVENOUS | Status: DC | PRN
  Administered 2020-08-13: 50 mg via INTRAVENOUS
  Administered 2020-08-13 (×3): 10 mg via INTRAVENOUS

## 2020-08-13 MED ORDER — CEFAZOLIN SODIUM-DEXTROSE 2-4 GM/100ML-% IV SOLN
2.0000 g | Freq: Four times a day (QID) | INTRAVENOUS | Status: AC
  Administered 2020-08-13 – 2020-08-14 (×2): 2 g via INTRAVENOUS
  Filled 2020-08-13 (×2): qty 100

## 2020-08-13 MED ORDER — HYDROMORPHONE HCL 1 MG/ML IJ SOLN: 0.2500 mg | INTRAMUSCULAR | Status: DC | PRN

## 2020-08-13 MED ORDER — PHENYLEPHRINE HCL-NACL 20-0.9 MG/250ML-% IV SOLN
INTRAVENOUS | Status: DC | PRN
  Administered 2020-08-13: 50 ug/min via INTRAVENOUS

## 2020-08-13 MED ORDER — VANCOMYCIN HCL 1000 MG IV SOLR
INTRAVENOUS | Status: DC | PRN
  Administered 2020-08-13: 1 g

## 2020-08-13 MED ORDER — ALBUMIN HUMAN 5 % IV SOLN
INTRAVENOUS | Status: AC
  Filled 2020-08-13: qty 250

## 2020-08-13 MED ORDER — STERILE WATER FOR IRRIGATION IR SOLN
Status: DC | PRN
  Administered 2020-08-13: 1000 mL

## 2020-08-13 MED ORDER — PHENYLEPHRINE 40 MCG/ML (10ML) SYRINGE FOR IV PUSH (FOR BLOOD PRESSURE SUPPORT)
PREFILLED_SYRINGE | INTRAVENOUS | Status: AC
  Filled 2020-08-13: qty 10

## 2020-08-13 MED ORDER — 0.9 % SODIUM CHLORIDE (POUR BTL) OPTIME
TOPICAL | Status: DC | PRN
  Administered 2020-08-13: 1000 mL

## 2020-08-13 MED ORDER — ENOXAPARIN SODIUM 40 MG/0.4ML IJ SOSY
40.0000 mg | PREFILLED_SYRINGE | INTRAMUSCULAR | Status: DC
  Administered 2020-08-14 – 2020-08-17 (×4): 40 mg via SUBCUTANEOUS
  Filled 2020-08-13 (×4): qty 0.4

## 2020-08-13 MED ORDER — CEFAZOLIN SODIUM-DEXTROSE 2-4 GM/100ML-% IV SOLN
INTRAVENOUS | Status: AC
  Filled 2020-08-13: qty 100

## 2020-08-13 MED ORDER — METOCLOPRAMIDE HCL 5 MG/ML IJ SOLN: 5.0000 mg | Freq: Three times a day (TID) | INTRAMUSCULAR | Status: DC | PRN

## 2020-08-13 MED ORDER — DOCUSATE SODIUM 100 MG PO CAPS
100.0000 mg | ORAL_CAPSULE | Freq: Two times a day (BID) | ORAL | Status: DC
  Administered 2020-08-14 – 2020-08-17 (×6): 100 mg via ORAL
  Filled 2020-08-13 (×8): qty 1

## 2020-08-13 MED ORDER — PROMETHAZINE HCL 25 MG/ML IJ SOLN: 6.2500 mg | INTRAMUSCULAR | Status: DC | PRN

## 2020-08-13 MED ORDER — FENTANYL CITRATE (PF) 100 MCG/2ML IJ SOLN
INTRAMUSCULAR | Status: AC
  Filled 2020-08-13: qty 2

## 2020-08-13 MED ORDER — TRANEXAMIC ACID-NACL 1000-0.7 MG/100ML-% IV SOLN
1000.0000 mg | INTRAVENOUS | Status: AC
  Administered 2020-08-13: 1000 mg via INTRAVENOUS

## 2020-08-13 MED ORDER — VANCOMYCIN HCL 1000 MG IV SOLR
INTRAVENOUS | Status: AC
  Filled 2020-08-13: qty 1000

## 2020-08-13 MED ORDER — TRANEXAMIC ACID-NACL 1000-0.7 MG/100ML-% IV SOLN
1000.0000 mg | Freq: Once | INTRAVENOUS | Status: AC
  Administered 2020-08-13: 1000 mg via INTRAVENOUS
  Filled 2020-08-13: qty 100

## 2020-08-13 MED ORDER — LACTATED RINGERS IV SOLN: INTRAVENOUS | Status: DC

## 2020-08-13 MED ORDER — SUGAMMADEX SODIUM 200 MG/2ML IV SOLN
INTRAVENOUS | Status: DC | PRN
  Administered 2020-08-13: 100 mg via INTRAVENOUS

## 2020-08-13 MED ORDER — PROPOFOL 10 MG/ML IV BOLUS
INTRAVENOUS | Status: AC
  Filled 2020-08-13: qty 20

## 2020-08-13 MED ORDER — PHENOL 1.4 % MT LIQD: 1.0000 | OROMUCOSAL | Status: DC | PRN

## 2020-08-13 MED ORDER — ONDANSETRON HCL 4 MG PO TABS: 4.0000 mg | ORAL_TABLET | Freq: Four times a day (QID) | ORAL | Status: DC | PRN

## 2020-08-13 MED ORDER — ALBUMIN HUMAN 5 % IV SOLN: INTRAVENOUS | Status: DC | PRN

## 2020-08-13 MED ORDER — OXYCODONE HCL 5 MG/5ML PO SOLN: 5.0000 mg | Freq: Once | ORAL | Status: DC | PRN

## 2020-08-13 MED ORDER — ORAL CARE MOUTH RINSE: 15.0000 mL | Freq: Once | OROMUCOSAL | Status: AC

## 2020-08-13 MED ORDER — CHOLECALCIFEROL 10 MCG (400 UNIT) PO TABS
400.0000 [IU] | ORAL_TABLET | Freq: Every day | ORAL | Status: DC
  Administered 2020-08-14 – 2020-08-17 (×4): 400 [IU] via ORAL
  Filled 2020-08-13 (×4): qty 1

## 2020-08-13 MED ORDER — SENNA 8.6 MG PO TABS
1.0000 | ORAL_TABLET | Freq: Two times a day (BID) | ORAL | Status: DC
  Administered 2020-08-14 – 2020-08-17 (×6): 8.6 mg via ORAL
  Filled 2020-08-13 (×8): qty 1

## 2020-08-13 MED ORDER — ONDANSETRON HCL 4 MG/2ML IJ SOLN
INTRAMUSCULAR | Status: AC
  Filled 2020-08-13: qty 2

## 2020-08-13 MED ORDER — FENTANYL CITRATE (PF) 100 MCG/2ML IJ SOLN
INTRAMUSCULAR | Status: DC | PRN
  Administered 2020-08-13 (×3): 50 ug via INTRAVENOUS
  Administered 2020-08-13 (×2): 25 ug via INTRAVENOUS
  Administered 2020-08-13: 50 ug via INTRAVENOUS

## 2020-08-13 MED ORDER — TRANEXAMIC ACID-NACL 1000-0.7 MG/100ML-% IV SOLN
INTRAVENOUS | Status: AC
  Filled 2020-08-13: qty 100

## 2020-08-13 MED ORDER — MENTHOL 3 MG MT LOZG: 1.0000 | LOZENGE | OROMUCOSAL | Status: DC | PRN

## 2020-08-13 MED ORDER — CEFAZOLIN SODIUM-DEXTROSE 2-4 GM/100ML-% IV SOLN
2.0000 g | Freq: Once | INTRAVENOUS | Status: AC
  Administered 2020-08-13: 2 g via INTRAVENOUS

## 2020-08-13 MED ORDER — DEXAMETHASONE SODIUM PHOSPHATE 10 MG/ML IJ SOLN
INTRAMUSCULAR | Status: DC | PRN
Start: 2020-08-13 — End: 2020-08-13
  Administered 2020-08-13: 5 mg via INTRAVENOUS

## 2020-08-13 MED ORDER — ACETAMINOPHEN 10 MG/ML IV SOLN
INTRAVENOUS | Status: AC
  Filled 2020-08-13: qty 100

## 2020-08-13 MED ORDER — METOCLOPRAMIDE HCL 5 MG PO TABS: 5.0000 mg | ORAL_TABLET | Freq: Three times a day (TID) | ORAL | Status: DC | PRN

## 2020-08-13 SURGICAL SUPPLY — 74 items
BAG COUNTER SPONGE SURGICOUNT (BAG) IMPLANT
BAG ZIPLOCK 12X15 (MISCELLANEOUS) ×2 IMPLANT
BIT DRILL 2.4 AO COUPLING CANN (BIT) ×2 IMPLANT
BIT DRILL 4.3 (BIT) ×2
BIT DRILL 4.3X300MM (BIT) ×1 IMPLANT
BIT DRILL LONG 3.3 (BIT) ×2 IMPLANT
BIT DRILL QC 3.3X195 (BIT) ×2 IMPLANT
BNDG CONFORM 6X.82 1P STRL (GAUZE/BANDAGES/DRESSINGS) ×2 IMPLANT
BNDG ELASTIC 6X10 VLCR STRL LF (GAUZE/BANDAGES/DRESSINGS) ×2 IMPLANT
BNDG GAUZE ELAST 4 BULKY (GAUZE/BANDAGES/DRESSINGS) ×2 IMPLANT
CAP LOCK NCB (Cap) ×14 IMPLANT
CHLORAPREP W/TINT 26 (MISCELLANEOUS) ×2 IMPLANT
COVER SURGICAL LIGHT HANDLE (MISCELLANEOUS) ×2 IMPLANT
DRAPE C-ARM 42X120 X-RAY (DRAPES) ×2 IMPLANT
DRAPE C-ARMOR (DRAPES) ×2 IMPLANT
DRAPE ORTHO SPLIT 77X108 STRL (DRAPES) ×2
DRAPE POUCH INSTRU U-SHP 10X18 (DRAPES) ×2 IMPLANT
DRAPE SHEET LG 3/4 BI-LAMINATE (DRAPES) ×6 IMPLANT
DRAPE SURG ORHT 6 SPLT 77X108 (DRAPES) ×2 IMPLANT
DRSG AQUACEL AG ADV 3.5X 6 (GAUZE/BANDAGES/DRESSINGS) ×2 IMPLANT
DRSG AQUACEL AG ADV 3.5X14 (GAUZE/BANDAGES/DRESSINGS) ×2 IMPLANT
DRSG EMULSION OIL 3X16 NADH (GAUZE/BANDAGES/DRESSINGS) ×2 IMPLANT
ELECT REM PT RETURN 15FT ADLT (MISCELLANEOUS) ×2 IMPLANT
GAUZE SPONGE 4X4 12PLY STRL (GAUZE/BANDAGES/DRESSINGS) ×2 IMPLANT
GLOVE SRG 8 PF TXTR STRL LF DI (GLOVE) ×1 IMPLANT
GLOVE SURG ENC MOIS LTX SZ8.5 (GLOVE) ×4 IMPLANT
GLOVE SURG ENC TEXT LTX SZ7 (GLOVE) IMPLANT
GLOVE SURG ENC TEXT LTX SZ7.5 (GLOVE) ×4 IMPLANT
GLOVE SURG UNDER POLY LF SZ7.5 (GLOVE) IMPLANT
GLOVE SURG UNDER POLY LF SZ8 (GLOVE) ×1
GLOVE SURG UNDER POLY LF SZ8.5 (GLOVE) ×2 IMPLANT
GOWN SPEC L3 XXLG W/TWL (GOWN DISPOSABLE) ×4 IMPLANT
GOWN STRL REUS W/TWL LRG LVL3 (GOWN DISPOSABLE) IMPLANT
IMMOBILIZER KNEE 20 (SOFTGOODS) ×2
IMMOBILIZER KNEE 20 THIGH 36 (SOFTGOODS) ×1 IMPLANT
JET LAVAGE IRRISEPT WOUND (IRRIGATION / IRRIGATOR)
K-WIRE 2.0 (WIRE) ×2
K-WIRE FXSTD 280X2XNS SS (WIRE) ×2
K-WIRE TROC 1.25X150 (WIRE) ×6
KIT BASIN OR (CUSTOM PROCEDURE TRAY) ×2 IMPLANT
KIT TURNOVER KIT A (KITS) ×2 IMPLANT
KWIRE FXSTD 280X2XNS SS (WIRE) ×2 IMPLANT
KWIRE TROC 1.25X150 (WIRE) ×3 IMPLANT
LAVAGE JET IRRISEPT WOUND (IRRIGATION / IRRIGATOR) IMPLANT
MANIFOLD NEPTUNE II (INSTRUMENTS) ×2 IMPLANT
NS IRRIG 1000ML POUR BTL (IV SOLUTION) ×2 IMPLANT
PACK TOTAL JOINT (CUSTOM PROCEDURE TRAY) ×2 IMPLANT
PENCIL SMOKE EVACUATOR (MISCELLANEOUS) IMPLANT
PLATE BONE LOCK 238MM 9HOLE (Plate) ×2 IMPLANT
PROTECTOR NERVE ULNAR (MISCELLANEOUS) ×2 IMPLANT
SCREW 5.0 32MM (Screw) ×2 IMPLANT
SCREW 5.0 60MM (Screw) ×4 IMPLANT
SCREW CANNULATED 4.0X48PT (Screw) ×2 IMPLANT
SCREW CORT NCB SELFTAP 5.0X42 (Screw) ×2 IMPLANT
SCREW CORTICAL NCB 5.0X40 (Screw) ×2 IMPLANT
SCREW CORTICAL NCB 5.0X65 (Screw) ×2 IMPLANT
SCREW NCB 5.0X34MM (Screw) ×2 IMPLANT
SCREW NCB 5.0X46MM (Screw) ×2 IMPLANT
SCREW NCB 5.0X55MM (Screw) ×2 IMPLANT
SCREW PART THREADED 4.0X70 (Screw) ×2 IMPLANT
STAPLER VISISTAT 35W (STAPLE) ×4 IMPLANT
STRIP CLOSURE SKIN 1/2X4 (GAUZE/BANDAGES/DRESSINGS) ×2 IMPLANT
SUT MNCRL AB 3-0 PS2 18 (SUTURE) ×2 IMPLANT
SUT MNCRL AB 4-0 PS2 18 (SUTURE) ×2 IMPLANT
SUT MON AB 2-0 CT1 36 (SUTURE) ×2 IMPLANT
SUT STRATAFIX PDO 1 14 VIOLET (SUTURE) ×1
SUT STRATFX PDO 1 14 VIOLET (SUTURE) ×1
SUT VIC AB 1 CT1 36 (SUTURE) ×4 IMPLANT
SUT VIC AB 2-0 CT1 27 (SUTURE) ×2
SUT VIC AB 2-0 CT1 TAPERPNT 27 (SUTURE) ×2 IMPLANT
SUTURE STRATFX PDO 1 14 VIOLET (SUTURE) ×1 IMPLANT
TOWEL OR 17X26 10 PK STRL BLUE (TOWEL DISPOSABLE) ×4 IMPLANT
WATER STERILE IRR 1000ML POUR (IV SOLUTION) ×2 IMPLANT
WRAP KNEE MAXI GEL POST OP (GAUZE/BANDAGES/DRESSINGS) ×2 IMPLANT

## 2020-08-13 NOTE — Anesthesia Postprocedure Evaluation (Signed)
Anesthesia Post Note  Patient: Christine Jennings  Procedure(s) Performed: OPEN REDUCTION INTERNAL FIXATION (ORIF) DISTAL FEMUR FRACTURE (Left)     Patient location during evaluation: PACU Anesthesia Type: Spinal Level of consciousness: awake and alert Pain management: pain level controlled Vital Signs Assessment: post-procedure vital signs reviewed and stable Respiratory status: spontaneous breathing, nonlabored ventilation and respiratory function stable Cardiovascular status: blood pressure returned to baseline and stable Postop Assessment: no apparent nausea or vomiting Anesthetic complications: no   No notable events documented.  Last Vitals:  Vitals:   08/13/20 1237 08/13/20 1748  BP: (!) 156/80 (!) 157/86  Pulse: 99 95  Resp: 16 13  Temp:  37.2 C  SpO2: 98% 100%    Last Pain:  Vitals:   08/13/20 1748  TempSrc:   PainSc: Asleep                 Lowella Curb

## 2020-08-13 NOTE — Interval H&P Note (Signed)
History and Physical Interval Note:  08/13/2020 2:29 PM  Christine Jennings  has presented today for surgery, with the diagnosis of LEFT DISTAL FEMUR FRACTURE.  The various methods of treatment have been discussed with the patient and family. After consideration of risks, benefits and other options for treatment, the patient has consented to  Procedure(s): OPEN REDUCTION INTERNAL FIXATION (ORIF) DISTAL FEMUR FRACTURE (Left) as a surgical intervention.  The patient's history has been reviewed, patient examined, no change in status, stable for surgery.  I have reviewed the patient's chart and labs.  Questions were answered to the patient's satisfaction.    The risks, benefits, and alternatives were discussed with the patient. There are risks associated with the surgery including, but not limited to, problems with anesthesia (death), infection, differences in leg length/angulation/rotation, fracture of bones, loosening or failure of implants, malunion, nonunion, hematoma (blood accumulation) which may require surgical drainage, blood clots, pulmonary embolism, nerve injury (foot drop), and blood vessel injury. The patient understands these risks and elects to proceed.    Christine Jennings

## 2020-08-13 NOTE — Anesthesia Procedure Notes (Signed)
Procedure Name: Intubation Date/Time: 08/13/2020 2:22 PM Performed by: Florene Route, CRNA Pre-anesthesia Checklist: Patient identified, Emergency Drugs available, Suction available and Patient being monitored Patient Re-evaluated:Patient Re-evaluated prior to induction Oxygen Delivery Method: Circle system utilized Preoxygenation: Pre-oxygenation with 100% oxygen Induction Type: IV induction Ventilation: Mask ventilation without difficulty Laryngoscope Size: Miller and 2 Grade View: Grade I Tube type: Oral Tube size: 7.0 mm Number of attempts: 1 Airway Equipment and Method: Stylet and Oral airway Placement Confirmation: ETT inserted through vocal cords under direct vision, positive ETCO2 and breath sounds checked- equal and bilateral Secured at: 21 cm Tube secured with: Tape Dental Injury: Teeth and Oropharynx as per pre-operative assessment

## 2020-08-13 NOTE — Discharge Instructions (Signed)
Dr. Samson Frederic Adult Hip & Knee Specialist [name] [address] [place], [place] [address] [phone]   POSTOPERATIVE DIRECTIONS    Rehabilitation, Guidelines Following Surgery   WEIGHT BEARING Other:  Touch down weight bearing left leg   HOME CARE INSTRUCTIONS  Remove items at home which could result in a fall. This includes throw rugs or furniture in walking pathways.  Continue medications as instructed at time of discharge. You may have some home medications which will be placed on hold until you complete the course of blood thinner medication. 4 days after discharge, you may start showering. No tub baths or soaking your incisions. Do not put on socks or shoes without following the instructions of your caregivers.   Sit on chairs with arms. Use the chair arms to help push yourself up when arising.  Arrange for the use of a toilet seat elevator so you are not sitting low.  Walk with walker as instructed.  You may resume a sexual relationship in one month or when given the OK by your caregiver.  Use walker as long as suggested by your caregivers.  Avoid periods of inactivity such as sitting longer than an hour when not asleep. This helps prevent blood clots.  You may return to work once you are cleared by Designer, industrial/product.  Do not drive a car for 6 weeks or until released by your surgeon.  Do not drive while taking narcotics.  Wear elastic stockings for two weeks following surgery during the day but you may remove then at night.  Make sure you keep all of your appointments after your operation with all of your doctors and caregivers. You should call the office at the above phone number and make an appointment for approximately two weeks after the date of your surgery. Please pick up a stool softener and laxative for home use as long as you are requiring pain medications. ICE to the affected hip every three hours for 30 minutes at a time and then  as needed for pain and swelling. Continue to use ice on the hip for pain and swelling from surgery. You may notice swelling that will progress down to the foot and ankle.  This is normal after surgery.  Elevate the leg when you are not up walking on it.   It is important for you to complete the blood thinner medication as prescribed by your doctor. Continue to use the breathing machine which will help keep your temperature down.  It is common for your temperature to cycle up and down following surgery, especially at night when you are not up moving around and exerting yourself.  The breathing machine keeps your lungs expanded and your temperature down.  RANGE OF MOTION AND STRENGTHENING EXERCISES  These exercises are designed to help you keep full movement of your hip joint. Follow your caregiver's or physical therapist's instructions. Perform all exercises about fifteen times, three times per day or as directed. Exercise both hips, even if you have had only one joint replacement. These exercises can be done on a training (exercise) mat, on the floor, on a table or on a bed. Use whatever works the best and is most comfortable for you. Use music or television while you are exercising so that the exercises are a pleasant break in your day. This will make your life better with the exercises acting as a break in routine you can look forward to.  Lying on your back, slowly slide  your foot toward your buttocks, raising your knee up off the floor. Then slowly slide your foot back down until your leg is straight again.  Lying on your back spread your legs as far apart as you can without causing discomfort.  Lying on your side, raise your upper leg and foot straight up from the floor as far as is comfortable. Slowly lower the leg and repeat.  Lying on your back, tighten up the muscle in the front of your thigh (quadriceps muscles). You can do this by keeping your leg straight and trying to raise your heel off the  floor. This helps strengthen the largest muscle supporting your knee.  Lying on your back, tighten up the muscles of your buttocks both with the legs straight and with the knee bent at a comfortable angle while keeping your heel on the floor.   SKILLED REHAB INSTRUCTIONS: If the patient is transferred to a skilled rehab facility following release from the hospital, a list of the current medications will be sent to the facility for the patient to continue.  When discharged from the skilled rehab facility, please have the facility set up the patient's Home Health Physical Therapy prior to being released. Also, the skilled facility will be responsible for providing the patient with their medications at time of release from the facility to include their pain medication and their blood thinner medication. If the patient is still at the rehab facility at time of the two week follow up appointment, the skilled rehab facility will also need to assist the patient in arranging follow up appointment in our office and any transportation needs.  MAKE SURE YOU:  Understand these instructions.  Will watch your condition.  Will get help right away if you are not doing well or get worse.  Pick up stool softner and laxative for home use following surgery while on pain medications. Daily dry dressing changes as needed. In 4 days, you may remove your dressings and begin taking showers - no tub baths or soaking the incisions. Continue to use ice for pain and swelling after surgery. Do not use any lotions or creams on the incision until instructed by your surgeon.

## 2020-08-13 NOTE — Plan of Care (Signed)
?  Problem: Activity: ?Goal: Risk for activity intolerance will decrease ?Outcome: Progressing ?  ?Problem: Safety: ?Goal: Ability to remain free from injury will improve ?Outcome: Progressing ?  ?Problem: Pain Managment: ?Goal: General experience of comfort will improve ?Outcome: Progressing ?  ?

## 2020-08-13 NOTE — Progress Notes (Signed)
PROGRESS NOTE    Christine Jennings  ZOX:[id number] DOB: Oct 10, 1927 DOA: 08/09/2020 PCP: Dois Davenportichter, Karen L, MD    Brief Narrative:  87 year old female with history of previous multiple orthopedic surgery, lives at home alone and walks with walker fell while trying to get up from the toilet and injured her left knee.  In the emergency room hemodynamically stable.  Found to have left distal femur fracture with intra articular extension. Orthopedic consulted and admitted to the hospital.  Assessment & Plan:   Active Problems:   Femur fracture (HCC)  Closed traumatic left distal femur fracture: Currently in splint.  Nonweightbearing.  Adequate pain medications with IV and oral opiates. Orthopedic surgery on board, possible ORIF today.  Osteoporosis: On Fosamax and vitamin D.  Resume on discharge.  Dysphagia: Seen by speech therapy.  Remains on dysphagia 3 diet with aspiration precautions.  Acute anemia of blood loss: expected from long bone fracture. Hb low at base line.  Will monitor. No indication for blood transfusion. She is ok to receive transfusion if needed.   Sinus tachycardia: likely due to pain and blood loss anemia. TSH normal.  2 D Echo apparently normal. Monitor.  Resolved.   DVT prophylaxis: enoxaparin (LOVENOX) injection 30 mg Start: 08/10/20 1600   Code Status: DNR Family Communication: Son Trey PaulaJeff on the phone.  Will like to talk to surgeon about the surgery.  Disposition Plan: Status is: Inpatient  Remains inpatient appropriate because:Unsafe d/c plan and Inpatient level of care appropriate due to severity of illness  Dispo: The patient is from: Home              Anticipated d/c is to: SNF after procedure.              Patient currently is not medically stable to d/c.   Difficult to place patient No  Consultants:  Orthopedics  Procedures:  None  Antimicrobials:  None   Subjective:  Seen and examined.  Left knee hurts.  Was wondering about the surgery.   I left a message to the surgeon.  Heart rate is controlled.  Objective: Vitals:   08/12/20 0650 08/12/20 1420 08/12/20 2139 08/13/20 0558  BP: 140/75 135/72 (!) 150/73 (!) 145/68  Pulse: 97 91 (!) 101 93  Resp: 18 15 17 18   Temp: 98.8 F (37.1 C) 98.4 F (36.9 C) 98.7 F (37.1 C) 98.2 F (36.8 C)  TempSrc: Oral Oral Oral Oral  SpO2: 100% 97% 98% 97%  Weight:      Height:        Intake/Output Summary (Last 24 hours) at 08/13/2020 1019 Last data filed at 08/13/2020 0600 Gross per 24 hour  Intake 2172.62 ml  Output 1250 ml  Net 922.62 ml   Filed Weights   08/09/20 1028 08/09/20 1429  Weight: 49.9 kg 42.6 kg    Examination:  General: Frail and debilitated.  Age appropriate.  Not in any distress.  Slightly anxious today. Cardiovascular: S1-S2 normal.  No added sounds. Respiratory: Bilateral clear.  No added sounds. Gastrointestinal: Soft and nontender.  Bowel sounds present. Ext: Left lower extremity on knee immobilizer.  Distal neurovascular status intact. Neuro: No focal neurological deficits.      Data Reviewed: I have personally reviewed following labs and imaging studies  CBC: Recent Labs  Lab 08/09/20 1110 08/10/20 0222 08/12/20 0301 08/13/20 0314  WBC 14.1* 12.3* 6.6 6.2  NEUTROABS 12.9*  --  4.8 4.2  HGB 13.7 12.4 8.7* 8.9*  HCT 42.0 37.6 27.8*  28.1*  MCV 94.4 93.5 99.3 96.6  PLT 172 165 103* 143*   Basic Metabolic Panel: Recent Labs  Lab 08/09/20 1110 08/10/20 0222  NA 140 134*  K 3.7 4.1  CL 100 100  CO2 29 28  GLUCOSE 171* 177*  BUN 25* 32*  CREATININE 0.64 0.91  CALCIUM 9.4 8.6*   GFR: Estimated Creatinine Clearance: 27.1 mL/min (by C-G formula based on SCr of 0.91 mg/dL). Liver Function Tests: No results for input(s): AST, ALT, ALKPHOS, BILITOT, PROT, ALBUMIN in the last 168 hours. No results for input(s): LIPASE, AMYLASE in the last 168 hours. No results for input(s): AMMONIA in the last 168 hours. Coagulation Profile: Recent  Labs  Lab 08/09/20 1110  INR 0.9   Cardiac Enzymes: No results for input(s): CKTOTAL, CKMB, CKMBINDEX, TROPONINI in the last 168 hours. BNP (last 3 results) No results for input(s): PROBNP in the last 8760 hours. HbA1C: No results for input(s): HGBA1C in the last 72 hours. CBG: No results for input(s): GLUCAP in the last 168 hours. Lipid Profile: No results for input(s): CHOL, HDL, LDLCALC, TRIG, CHOLHDL, LDLDIRECT in the last 72 hours. Thyroid Function Tests: Recent Labs    08/11/20 0835  TSH 1.946   Anemia Panel: Recent Labs    08/12/20 0857  VITAMINB12 201  FERRITIN 341*  TIBC 237*  IRON 37   Sepsis Labs: No results for input(s): PROCALCITON, LATICACIDVEN in the last 168 hours.  Recent Results (from the past 240 hour(s))  SARS CORONAVIRUS 2 (TAT 6-24 HRS) Nasopharyngeal Nasopharyngeal Swab     Status: None   Collection Time: 08/09/20 11:10 AM   Specimen: Nasopharyngeal Swab  Result Value Ref Range Status   SARS Coronavirus 2 NEGATIVE NEGATIVE Final    Comment: (NOTE) SARS-CoV-2 target nucleic acids are NOT DETECTED.  The SARS-CoV-2 RNA is generally detectable in upper and lower respiratory specimens during the acute phase of infection. Negative results do not preclude SARS-CoV-2 infection, do not rule out co-infections with other pathogens, and should not be used as the sole basis for treatment or other patient management decisions. Negative results must be combined with clinical observations, patient history, and epidemiological information. The expected result is Negative.  Fact Sheet for Patients: HairSlick.no  Fact Sheet for Healthcare Providers: quierodirigir.com  This test is not yet approved or cleared by the [place] FDA and  has been authorized for detection and/or diagnosis of SARS-CoV-2 by FDA under an Emergency Use Authorization (EUA). This EUA will remain  in effect (meaning this test  can be used) for the duration of the COVID-19 declaration under Se ction 564(b)(1) of the Act, 21 U.S.C. section 360bbb-3(b)(1), unless the authorization is terminated or revoked sooner.  Performed at [facility], [address] [place], [place] [address]   Surgical pcr screen     Status: None   Collection Time: 08/12/20 10:23 AM   Specimen: Nasal Mucosa; Nasal Swab  Result Value Ref Range Status   MRSA, PCR NEGATIVE NEGATIVE Final   Staphylococcus aureus NEGATIVE NEGATIVE Final    Comment: (NOTE) The Xpert SA Assay (FDA approved for NASAL specimens in patients 18 years of age and older), is one component of a comprehensive surveillance program. It is not intended to diagnose infection nor to guide or monitor treatment. Performed at [facility], [address] [place], [place] [address]          Radiology Studies: ECHOCARDIOGRAM COMPLETE  Result Date: 08/11/2020    ECHOCARDIOGRAM REPORT  Patient Name:   RAMISA DUMAN Date of Exam: 08/11/2020 Medical Rec #:  [id number]         Height:       60.0 in Accession #:    [id number]        Weight:       93.9 lb Date of Birth:  03-02-1927         BSA:          1.354 m Patient Age:    87 years          BP:           102/60 mmHg Patient Gender: F                 HR:           105 bpm. Exam Location:  Inpatient Procedure: 2D Echo, Cardiac Doppler and Color Doppler Indications:    R07.9* Chest pain, unspecified  History:        Patient has prior history of Echocardiogram examinations, most                 recent 01/27/2018.  Sonographer:    Eulah Pont RDCS Referring Phys: [id number] Dorcas Carrow IMPRESSIONS  1. Left ventricular ejection fraction, by estimation, is 70 to 75%. The left ventricle has hyperdynamic function. The left ventricle has no regional wall motion abnormalities. Left ventricular diastolic parameters are consistent with Grade I diastolic dysfunction (impaired relaxation).  2. Right ventricular  systolic function is normal. The right ventricular size is normal. There is normal pulmonary artery systolic pressure.  3. The mitral valve is normal in structure. No evidence of mitral valve regurgitation. No evidence of mitral stenosis.  4. The aortic valve is calcified. Aortic valve regurgitation is trivial. No aortic stenosis is present.  5. The inferior vena cava is normal in size with greater than 50% respiratory variability, suggesting right atrial pressure of 3 mmHg. FINDINGS  Left Ventricle: Left ventricular ejection fraction, by estimation, is 70 to 75%. The left ventricle has hyperdynamic function. The left ventricle has no regional wall motion abnormalities. The left ventricular internal cavity size was normal in size. There is no left ventricular hypertrophy. Left ventricular diastolic parameters are consistent with Grade I diastolic dysfunction (impaired relaxation). Right Ventricle: The right ventricular size is normal. Right ventricular systolic function is normal. There is normal pulmonary artery systolic pressure. The tricuspid regurgitant velocity is 2.42 m/s, and with an assumed right atrial pressure of 3 mmHg,  the estimated right ventricular systolic pressure is 26.4 mmHg. Left Atrium: Left atrial size was normal in size. Right Atrium: Right atrial size was normal in size. Pericardium: There is no evidence of pericardial effusion. Mitral Valve: The mitral valve is normal in structure. Mild mitral annular calcification. No evidence of mitral valve regurgitation. No evidence of mitral valve stenosis. Tricuspid Valve: The tricuspid valve is normal in structure. Tricuspid valve regurgitation is trivial. No evidence of tricuspid stenosis. Aortic Valve: The aortic valve is calcified. Aortic valve regurgitation is trivial. Aortic regurgitation PHT measures 496 msec. No aortic stenosis is present. Pulmonic Valve: The pulmonic valve was normal in structure. Pulmonic valve regurgitation is not  visualized. No evidence of pulmonic stenosis. Aorta: The aortic root is normal in size and structure. Venous: The inferior vena cava is normal in size with greater than 50% respiratory variability, suggesting right atrial pressure of 3 mmHg. IAS/Shunts: No atrial level shunt detected by color flow Doppler.  LEFT VENTRICLE PLAX 2D LVIDd:  2.50 cm LVIDs:         1.60 cm LV PW:         1.00 cm LV IVS:        1.10 cm LVOT diam:     2.10 cm LV SV:         83 LV SV Index:   61 LVOT Area:     3.46 cm  RIGHT VENTRICLE RV S prime:     19.50 cm/s TAPSE (M-mode): 1.6 cm LEFT ATRIUM             Index       RIGHT ATRIUM          Index LA diam:        3.00 cm 2.22 cm/m  RA Area:     8.43 cm LA Vol (A2C):   35.9 ml 26.52 ml/m RA Volume:   13.90 ml 10.27 ml/m LA Vol (A4C):   31.8 ml 23.49 ml/m LA Biplane Vol: 36.6 ml 27.04 ml/m  AORTIC VALVE LVOT Vmax:   130.33 cm/s LVOT Vmean:  87.000 cm/s LVOT VTI:    0.239 m AI PHT:      496 msec  AORTA Ao Root diam: 2.80 cm Ao Asc diam:  2.70 cm TRICUSPID VALVE TR Peak grad:   23.4 mmHg TR Vmax:        242.00 cm/s  SHUNTS Systemic VTI:  0.24 m Systemic Diam: 2.10 cm Olga Millers MD Electronically signed by Olga Millers MD Signature Date/Time: 08/11/2020/3:41:01 PM    Final         Scheduled Meds:  docusate sodium  100 mg Oral BID   enoxaparin (LOVENOX) injection  30 mg Subcutaneous Q24H   feeding supplement  1 Container Oral Q24H   feeding supplement  237 mL Oral BID BM   multivitamin with minerals  1 tablet Oral Daily   Continuous Infusions:  dextrose 5 % and 0.9% NaCl 75 mL/hr at 08/13/20 0519     LOS: 4 days    Time spent: 25 minutes    Dorcas Carrow, MD [facility] Pager [phone]

## 2020-08-13 NOTE — Transfer of Care (Signed)
Immediate Anesthesia Transfer of Care Note  Patient: SHAKEETA GODETTE  Procedure(s) Performed: OPEN REDUCTION INTERNAL FIXATION (ORIF) DISTAL FEMUR FRACTURE (Left)  Patient Location: PACU  Anesthesia Type:General  Level of Consciousness: awake, drowsy and patient cooperative  Airway & Oxygen Therapy: Patient Spontanous Breathing and Patient connected to face mask oxygen  Post-op Assessment: Report given to RN, Post -op Vital signs reviewed and stable and Patient moving all extremities X 4  Post vital signs: stable  Last Vitals:  Vitals Value Taken Time  BP 157/86 08/13/20 1748  Temp    Pulse 93 08/13/20 1752  Resp 18 08/13/20 1752  SpO2 100 % 08/13/20 1752  Vitals shown include unvalidated device data.  Last Pain:  Vitals:   08/13/20 1157  TempSrc:   PainSc: 0-No pain      Patients Stated Pain Goal: 0 (08/13/20 0145)  Complications: No notable events documented.

## 2020-08-13 NOTE — Anesthesia Preprocedure Evaluation (Signed)
Anesthesia Evaluation  Patient identified by MRN, date of birth, ID band Patient awake    Reviewed: Allergy & Precautions, NPO status , Patient's Chart, lab work & pertinent test results  Airway Mallampati: II  TM Distance: >3 FB Neck ROM: Full    Dental  (+) Teeth Intact, Dental Advisory Given   Pulmonary    breath sounds clear to auscultation       Cardiovascular  Rhythm:Regular Rate:Normal     Neuro/Psych    GI/Hepatic   Endo/Other    Renal/GU      Musculoskeletal   Abdominal   Peds  Hematology   Anesthesia Other Findings   Reproductive/Obstetrics                             Anesthesia Physical  Anesthesia Plan  ASA: III  Anesthesia Plan: Spinal   Post-op Pain Management:    Induction:   PONV Risk Score and Plan: Ondansetron and Propofol infusion  Airway Management Planned: Natural Airway and Simple Face Mask  Additional Equipment:   Intra-op Plan:   Post-operative Plan:   Informed Consent: I have reviewed the patients History and Physical, chart, labs and discussed the procedure including the risks, benefits and alternatives for the proposed anesthesia with the patient or authorized representative who has indicated his/her understanding and acceptance.     Dental advisory given  Plan Discussed with: CRNA and Anesthesiologist  Anesthesia Plan Comments:         Anesthesia Quick Evaluation

## 2020-08-14 DIAGNOSIS — S72402A Unspecified fracture of lower end of left femur, initial encounter for closed fracture: Secondary | ICD-10-CM | POA: Diagnosis not present

## 2020-08-14 LAB — CBC
HCT: 36.4 % (ref 36.0–46.0)
Hemoglobin: 12.1 g/dL (ref 12.0–15.0)
MCH: 30.2 pg (ref 26.0–34.0)
MCHC: 33.2 g/dL (ref 30.0–36.0)
MCV: 90.8 fL (ref 80.0–100.0)
Platelets: 145 10*3/uL — ABNORMAL LOW (ref 150–400)
RBC: 4.01 MIL/uL (ref 3.87–5.11)
RDW: 14.7 % (ref 11.5–15.5)
WBC: 7.9 10*3/uL (ref 4.0–10.5)
nRBC: 0 % (ref 0.0–0.2)

## 2020-08-14 LAB — TYPE AND SCREEN
ABO/RH(D): O NEG
Antibody Screen: NEGATIVE
Unit division: 0
Unit division: 0

## 2020-08-14 LAB — BPAM RBC
Blood Product Expiration Date: 202209012359
Blood Product Expiration Date: 202209032359
ISSUE DATE / TIME: 202208101600
ISSUE DATE / TIME: 202208101600
Unit Type and Rh: 9500
Unit Type and Rh: 9500

## 2020-08-14 LAB — BASIC METABOLIC PANEL
Anion gap: 9 (ref 5–15)
BUN: 14 mg/dL (ref 8–23)
CO2: 26 mmol/L (ref 22–32)
Calcium: 8 mg/dL — ABNORMAL LOW (ref 8.9–10.3)
Chloride: 104 mmol/L (ref 98–111)
Creatinine, Ser: 0.44 mg/dL (ref 0.44–1.00)
GFR, Estimated: >60 mL/min (ref >60)
Glucose, Bld: 143 mg/dL — ABNORMAL HIGH (ref 70–99)
Potassium: 4.4 mmol/L (ref 3.5–5.1)
Sodium: 139 mmol/L (ref 135–145)

## 2020-08-14 NOTE — Progress Notes (Signed)
Called Ortho Tech to follow up about Bledsoe brace.   She reported she had sent an order over to [employer] about the brace.   After our conversation, Victorino Dike reported she would call the supplier to see about a timeline for the brace to arrive.    [place], Charity fundraiser

## 2020-08-14 NOTE — [place] FL2 (Signed)
[place] MEDICAID FL2 LEVEL OF CARE SCREENING TOOL     IDENTIFICATION  Patient Name: Christine Jennings Birthdate: [birth year] Sex: female Admission Date (Current Location): 08/09/2020  [facility] and [place] Number:  Producer, television/film/video and Address:  [facility],  [address] [place], [place] [address]      Provider Number: [id number]  Attending Physician Name and Address:  Dorcas Carrow, MD  Relative Name and Phone Number:  son, Shalese Strahan @ [phone]    Current Level of Care: Hospital Recommended Level of Care: Skilled Nursing Facility Prior Approval Number:    Date Approved/Denied:   PASRR Number: [id number]  Discharge Plan: SNF    Current Diagnoses: Patient Active Problem List   Diagnosis Date Noted   Femur fracture (HCC) 08/09/2020   Closed comminuted intertrochanteric fracture of left femur (HCC) 06/30/2018   Intertrochanteric fracture of left hip (HCC) 06/29/2018   Anemia 06/29/2018   Hyperglycemia 06/29/2018   Protein-calorie malnutrition, severe 06/28/2017   Hip fracture (HCC) 06/25/2017   Closed displaced intertrochanteric fracture of right femur (HCC) 06/25/2017    Orientation RESPIRATION BLADDER Height & Weight     Self, Time, Situation, Place  O2 (but does not require O2 at baseline - likely to wean off prior to dc) Incontinent, Indwelling catheter Weight: 93 lb 14.7 oz (42.6 kg) Height:  5' (152.4 cm)  BEHAVIORAL SYMPTOMS/MOOD NEUROLOGICAL BOWEL NUTRITION STATUS      Continent    AMBULATORY STATUS COMMUNICATION OF NEEDS Skin   Extensive Assist Verbally Surgical wounds                       Personal Care Assistance Level of Assistance  Bathing, Dressing Bathing Assistance: Limited assistance   Dressing Assistance: Limited assistance     Functional Limitations Info  Hearing   Hearing Info: Impaired      SPECIAL CARE FACTORS FREQUENCY  PT (By licensed PT), OT (By licensed OT)     PT Frequency: 5x/wk OT Frequency:  5x/wk            Contractures Contractures Info: Not present    Additional Factors Info  Code Status, Allergies Code Status Info: DNR Allergies Info: NKDA           Current Medications (08/14/2020):  This is the current hospital active medication list Current Facility-Administered Medications  Medication Dose Route Frequency Provider Last Rate Last Admin   0.9 %  sodium chloride infusion   Intravenous Continuous Swinteck, Arlys John, MD 75 mL/hr at 08/13/20 1944 New Bag at 08/13/20 1944   calcium carbonate (OS-CAL - dosed in mg of elemental calcium) tablet 500 mg of elemental calcium  1 tablet Oral Q breakfast Rexford Maus, RPH   500 mg of elemental calcium at 08/14/20 1021   cholecalciferol (VITAMIN D3) tablet 400 Units  400 Units Oral Daily Rexford Maus, RPH   400 Units at 08/14/20 1021   docusate sodium (COLACE) capsule 100 mg  100 mg Oral BID Swinteck, Arlys John, MD       enoxaparin (LOVENOX) injection 40 mg  40 mg Subcutaneous Q24H Samson Frederic, MD   40 mg at 08/14/20 0941   feeding supplement (BOOST / RESOURCE BREEZE) liquid 1 Container  1 Container Oral Q24H Samson Frederic, MD   1 Container at 08/14/20 1328   feeding supplement (ENSURE ENLIVE / ENSURE PLUS) liquid 237 mL  237 mL Oral BID BM Samson Frederic, MD   237 mL at 08/14/20 0941   hydrALAZINE (APRESOLINE)  injection 10 mg  10 mg Intravenous Q8H PRN Swinteck, Arlys John, MD       HYDROcodone-acetaminophen (NORCO/VICODIN) 5-325 MG per tablet 1-2 tablet  1-2 tablet Oral Q6H PRN Samson Frederic, MD   2 tablet at 08/14/20 1328   menthol-cetylpyridinium (CEPACOL) lozenge 3 mg  1 lozenge Oral PRN Swinteck, Arlys John, MD       Or   phenol (CHLORASEPTIC) mouth spray 1 spray  1 spray Mouth/Throat PRN Swinteck, Arlys John, MD       metoCLOPramide (REGLAN) tablet 5-10 mg  5-10 mg Oral Q8H PRN Swinteck, Arlys John, MD       Or   metoCLOPramide (REGLAN) injection 5-10 mg  5-10 mg Intravenous Q8H PRN Swinteck, Arlys John, MD       morphine 2 MG/ML  injection 0.5 mg  0.5 mg Intravenous Q2H PRN Swinteck, Arlys John, MD   0.5 mg at 08/14/20 1134   ondansetron (ZOFRAN) tablet 4 mg  4 mg Oral Q6H PRN Swinteck, Arlys John, MD       Or   ondansetron (ZOFRAN) injection 4 mg  4 mg Intravenous Q6H PRN Swinteck, Arlys John, MD       senna (SENOKOT) tablet 8.6 mg  1 tablet Oral BID Samson Frederic, MD         Discharge Medications: Please see discharge summary for a list of discharge medications.  Relevant Imaging Results:  Relevant Lab Results:   Additional Information SSN: [id number]  Amada Jupiter, LCSW

## 2020-08-14 NOTE — Progress Notes (Signed)
PROGRESS NOTE    Christine Jennings  JSE:[id number] DOB: July 25, 1927 DOA: 08/09/2020 PCP: Dois Davenport, MD    Brief Narrative:  87 year old female with history of previous multiple orthopedic surgery, lives at home alone and walks with walker fell while trying to get up from the toilet and injured her left knee.  In the emergency room hemodynamically stable.  Found to have left distal femur fracture with intra articular extension. Orthopedic consulted and admitted to the hospital. 8/10, ORIF with plate and screw left distal femur.  Assessment & Plan:   Active Problems:   Femur fracture (HCC)  Closed traumatic left distal femur fracture: ORIF Dr. Linna Caprice 8/10 Toe-touch weightbearing Adequate pain medications with IV and oral opiates with laxatives. Lovenox for DVT prophylaxis. Work with PT OT.  Refer to SNF.  Should be able to go to SNF tomorrow Hinged brace to keep in extension. ROM with PT only.  Osteoporosis: On Fosamax and vitamin D.  Resume on discharge.  Dysphagia: Seen by speech therapy.  Remains on dysphagia 3 diet with aspiration precautions.  Acute anemia of blood loss: expected from long bone fracture. Hb low at base line.  Will monitor. No indication for blood transfusion. She is ok to receive transfusion if needed.   Sinus tachycardia: likely due to pain and blood loss anemia. TSH normal.  2 D Echo apparently normal. Monitor.  Resolved.   DVT prophylaxis: enoxaparin (LOVENOX) injection 40 mg Start: 08/14/20 0800 SCDs Start: 08/13/20 1927   Code Status: DNR Family Communication: son Trey Paula called   Disposition Plan: Status is: Inpatient  Remains inpatient appropriate because:Unsafe d/c plan and Inpatient level of care appropriate due to severity of illness  Dispo: The patient is from: Home              Anticipated d/c is to: SNF               Patient currently is not medically stable to d/c.   Difficult to place patient No  Consultants:   Orthopedics  Procedures:  None  Antimicrobials:  None   Subjective:  Seen and examined.  Sitting in chair. Knee hurts. No other overnight events.  Objective: Vitals:   08/13/20 1959 08/13/20 2153 08/14/20 0206 08/14/20 0704  BP:  (!) 151/72 (!) 146/71 (!) 142/70  Pulse:  88 87 90  Resp:  18 18 16   Temp:  98.6 F (37 C) 98.6 F (37 C) 99 F (37.2 C)  TempSrc:  Axillary Oral Oral  SpO2: 99% 100% 100% 99%  Weight:      Height:        Intake/Output Summary (Last 24 hours) at 08/14/2020 1107 Last data filed at 08/14/2020 0600 Gross per 24 hour  Intake 4050.25 ml  Output 1700 ml  Net 2350.25 ml   Filed Weights   08/09/20 1028 08/09/20 1429  Weight: 49.9 kg 42.6 kg    Examination:  General: Frail and debilitated but age appropriate. Sitting in chair and eating. Not in any distress. Cardiovascular: S1-S2 normal.  No added sounds. Respiratory: Bilateral clear.  No added sounds. Gastrointestinal: Soft and nontender.  Bowel sounds present. Ext: Left lower extremity on knee immobilizer.  Distal neurovascular status intact. Post op dressing and compression bandage not removed by me. Neuro: No focal neurological deficits.      Data Reviewed: I have personally reviewed following labs and imaging studies  CBC: Recent Labs  Lab 08/09/20 1110 08/10/20 0222 08/12/20 0301 08/13/20 10/13/20 08/13/20 1948 08/14/20 0315  WBC 14.1* 12.3* 6.6 6.2 9.0 7.9  NEUTROABS 12.9*  --  4.8 4.2  --   --   HGB 13.7 12.4 8.7* 8.9* 12.4 12.1  HCT 42.0 37.6 27.8* 28.1* 38.2 36.4  MCV 94.4 93.5 99.3 96.6 91.8 90.8  PLT 172 165 103* 143* 139* 145*   Basic Metabolic Panel: Recent Labs  Lab 08/09/20 1110 08/10/20 0222 08/13/20 1948 08/14/20 0315  NA 140 134*  --  139  K 3.7 4.1  --  4.4  CL 100 100  --  104  CO2 29 28  --  26  GLUCOSE 171* 177*  --  143*  BUN 25* 32*  --  14  CREATININE 0.64 0.91 0.51 0.44  CALCIUM 9.4 8.6*  --  8.0*   GFR: Estimated Creatinine Clearance:  30.8 mL/min (by C-G formula based on SCr of 0.44 mg/dL). Liver Function Tests: No results for input(s): AST, ALT, ALKPHOS, BILITOT, PROT, ALBUMIN in the last 168 hours. No results for input(s): LIPASE, AMYLASE in the last 168 hours. No results for input(s): AMMONIA in the last 168 hours. Coagulation Profile: Recent Labs  Lab 08/09/20 1110  INR 0.9   Cardiac Enzymes: No results for input(s): CKTOTAL, CKMB, CKMBINDEX, TROPONINI in the last 168 hours. BNP (last 3 results) No results for input(s): PROBNP in the last 8760 hours. HbA1C: No results for input(s): HGBA1C in the last 72 hours. CBG: No results for input(s): GLUCAP in the last 168 hours. Lipid Profile: No results for input(s): CHOL, HDL, LDLCALC, TRIG, CHOLHDL, LDLDIRECT in the last 72 hours. Thyroid Function Tests: No results for input(s): TSH, T4TOTAL, FREET4, T3FREE, THYROIDAB in the last 72 hours.  Anemia Panel: Recent Labs    08/12/20 0857  VITAMINB12 201  FERRITIN 341*  TIBC 237*  IRON 37   Sepsis Labs: No results for input(s): PROCALCITON, LATICACIDVEN in the last 168 hours.  Recent Results (from the past 240 hour(s))  SARS CORONAVIRUS 2 (TAT 6-24 HRS) Nasopharyngeal Nasopharyngeal Swab     Status: None   Collection Time: 08/09/20 11:10 AM   Specimen: Nasopharyngeal Swab  Result Value Ref Range Status   SARS Coronavirus 2 NEGATIVE NEGATIVE Final    Comment: (NOTE) SARS-CoV-2 target nucleic acids are NOT DETECTED.  The SARS-CoV-2 RNA is generally detectable in upper and lower respiratory specimens during the acute phase of infection. Negative results do not preclude SARS-CoV-2 infection, do not rule out co-infections with other pathogens, and should not be used as the sole basis for treatment or other patient management decisions. Negative results must be combined with clinical observations, patient history, and epidemiological information. The expected result is Negative.  Fact Sheet for  Patients: HairSlick.no  Fact Sheet for Healthcare Providers: quierodirigir.com  This test is not yet approved or cleared by the [place] FDA and  has been authorized for detection and/or diagnosis of SARS-CoV-2 by FDA under an Emergency Use Authorization (EUA). This EUA will remain  in effect (meaning this test can be used) for the duration of the COVID-19 declaration under Se ction 564(b)(1) of the Act, 21 U.S.C. section 360bbb-3(b)(1), unless the authorization is terminated or revoked sooner.  Performed at [facility], [address] [place], [place] [address]   Surgical pcr screen     Status: None   Collection Time: 08/12/20 10:23 AM   Specimen: Nasal Mucosa; Nasal Swab  Result Value Ref Range Status   MRSA, PCR NEGATIVE NEGATIVE Final   Staphylococcus aureus NEGATIVE NEGATIVE Final  Comment: (NOTE) The Xpert SA Assay (FDA approved for NASAL specimens in patients 60 years of age and older), is one component of a comprehensive surveillance program. It is not intended to diagnose infection nor to guide or monitor treatment. Performed at [facility], [address] [place], [place] [address]          Radiology Studies: DG C-Arm 1-60 Min  Result Date: 08/13/2020 CLINICAL DATA:  Surgery EXAM: LEFT FEMUR 2 VIEWS; DG C-ARM 1-60 MIN COMPARISON:  Plain films 08/09/2020 FINDINGS: Plate and screw fixation device noted across the distal left femoral fracture. Anatomic alignment. No hardware complicating feature. IMPRESSION: Internal fixation across the distal femoral fracture. No visible complicating feature. Electronically Signed   By: Charlett Nose M.D.   On: 08/13/2020 17:21   DG FEMUR MIN 2 VIEWS LEFT  Result Date: 08/13/2020 CLINICAL DATA:  Surgery EXAM: LEFT FEMUR 2 VIEWS; DG C-ARM 1-60 MIN COMPARISON:  Plain films 08/09/2020 FINDINGS: Plate and screw fixation device noted across the  distal left femoral fracture. Anatomic alignment. No hardware complicating feature. IMPRESSION: Internal fixation across the distal femoral fracture. No visible complicating feature. Electronically Signed   By: Charlett Nose M.D.   On: 08/13/2020 17:21   DG FEMUR PORT MIN 2 VIEWS LEFT  Result Date: 08/13/2020 CLINICAL DATA:  Status post ORIF distal femur fracture EXAM: LEFT FEMUR PORTABLE 2 VIEWS COMPARISON:  CT left knee dated 08/09/2020 FINDINGS: Status post lateral compression plate and screw fixation of the lateral femoral condyle. Fracture fragment is in anatomic alignment and position. Associated mild soft tissue swelling with overlying skin staples. Prior ORIF of a proximal femur fracture, without evidence of complication. IMPRESSION: Status post ORIF of the lateral femoral condyle, as above. Electronically Signed   By: Charline Bills M.D.   On: 08/13/2020 20:41        Scheduled Meds:  calcium carbonate  1 tablet Oral Q breakfast   cholecalciferol  400 Units Oral Daily   docusate sodium  100 mg Oral BID   enoxaparin (LOVENOX) injection  40 mg Subcutaneous Q24H   feeding supplement  1 Container Oral Q24H   feeding supplement  237 mL Oral BID BM   senna  1 tablet Oral BID   Continuous Infusions:  sodium chloride 75 mL/hr at 08/13/20 1944     LOS: 5 days    Time spent: 25 minutes    Dorcas Carrow, MD [facility] Pager [phone]

## 2020-08-14 NOTE — TOC Initial Note (Signed)
Transition of Care [facility]) - Initial/Assessment Note    Patient Details  Name: Christine Jennings MRN: [id number] Date of Birth: 03-10-1927  Transition of [facility] CM/SW Contact:    Lennart Pall, LCSW Phone Number: 08/14/2020, 11:13 AM  Clinical Narrative:                 Met with pt and spoke with son via phone call to introduce TOC role and confirm dc plans. Pt very HOH but is oriented and aware therapy to begin today. Pt and son are aware anticipated plan for SNF as pt does live alone.  Son requested that I coordinate all dc arrangements with the private community case manager they have, Billy Fischer.  Have left a VM for Ms. Harris.  Will continue to follow and work on SNF dc.  Expected Discharge Plan: [place] Barriers to Discharge: Continued Medical Work up   Patient Goals and CMS Choice Patient states their goals for this hospitalization and ongoing recovery are:: return home following rehab      Expected Discharge Plan and Services Expected Discharge Plan: [place] arrangements for the past 2 months: Single Family Home                 DME Arranged: N/A DME Agency: NA                  Prior Living Arrangements/Services Living arrangements for the past 2 months: [place] with:: Self Patient language and need for interpreter reviewed:: Yes Do you feel safe going back to the place where you live?: Yes      Need for Family Participation in Patient Care: No (Comment) Care giver support system in place?: Yes (comment)   Criminal Activity/Legal Involvement Pertinent to Current Situation/Hospitalization: No - Comment as needed  Activities of Daily Living Home Assistive Devices/Equipment: Walker (specify type), Grab bars in shower, Eyeglasses ADL Screening (condition at time of admission) Patient's cognitive ability adequate to safely complete daily activities?: Yes Is the patient deaf or have difficulty  hearing?: No Does the patient have difficulty seeing, even when wearing glasses/contacts?: Yes Does the patient have difficulty concentrating, remembering, or making decisions?: No Patient able to express need for assistance with ADLs?: Yes Does the patient have difficulty dressing or bathing?: Yes Independently performs ADLs?: No Communication: Independent Dressing (OT): Needs assistance Is this a change from baseline?: Change from baseline, expected to last >3 days Grooming: Needs assistance Is this a change from baseline?: Change from baseline, expected to last >3 days Feeding: Independent Bathing: Needs assistance Is this a change from baseline?: Change from baseline, expected to last >3 days Toileting: Needs assistance Is this a change from baseline?: Change from baseline, expected to last >3days In/Out Bed: Needs assistance Is this a change from baseline?: Change from baseline, expected to last >3 days Walks in Home: Needs assistance Is this a change from baseline?: Change from baseline, expected to last >3 days Does the patient have difficulty walking or climbing stairs?: Yes Weakness of Legs: Both Weakness of Arms/Hands: None  Permission Sought/Granted Permission sought to share information with : Family Supports, Other (comment) (Geriatric Community Case Manager, Billy Fischer) Permission granted to share information with : Yes, Verbal Permission Granted  Share Information with NAME: son, Merry Proud (in [place]) @ [phone]  Permission granted to share info w AGENCY: Community Case Belenda Cruise @ [phone]  Emotional Assessment Appearance:: Appears stated age Attitude/Demeanor/Rapport: Engaged Affect (typically observed): Accepting Orientation: : Oriented to Self, Oriented to Place, Oriented to  Time, Oriented to Situation Alcohol / Substance Use: Not Applicable Psych Involvement: No (comment)  Admission diagnosis:  Femur fracture ([place])  [S72.90XA] Patient Active Problem List   Diagnosis Date Noted   Femur fracture ([place]) 08/09/2020   Closed comminuted intertrochanteric fracture of left femur ([place]) 06/30/2018   Intertrochanteric fracture of left hip ([place]) 06/29/2018   Anemia 06/29/2018   Hyperglycemia 06/29/2018   Protein-calorie malnutrition, severe 06/28/2017   Hip fracture ([place]) 06/25/2017   Closed displaced intertrochanteric fracture of right femur ([place]) 06/25/2017   PCP:  Hayden Rasmussen, MD Pharmacy:   [facility], [place] [address] Phone: [phone] Fax: [phone]     Social Determinants of Health (SDOH) Interventions    Readmission Risk Interventions No flowsheet data found.

## 2020-08-14 NOTE — Progress Notes (Signed)
Physical Therapy Evaluation Patient Details Name: Christine Jennings MRN: [id number] DOB: 08/17/1927 Today's Date: 08/14/2020   History of Present Illness  Pt s/p fall with distal femur fx and now s/p ORIF.  Pt with hx of R femur IM nail (19) and L femur IM nail (20).  Clinical Impression  Pt admitted as above and presenting with functional mobility limitations 2* decreased L LE strength/ROM, post op pain, TWB on L LE, and premorbid deconditioning.  Pt would benefit from follow up rehab at SNF level to maximize IND and safety.    Follow Up Recommendations SNF    Equipment Recommendations  None recommended by PT    Recommendations for Other Services       Precautions / Restrictions Precautions Precautions: Fall Restrictions Weight Bearing Restrictions: Yes LLE Weight Bearing: Touchdown weight bearing      Mobility  Bed Mobility Overal bed mobility: Needs Assistance Bed Mobility: Supine to Sit     Supine to sit: Mod assist     General bed mobility comments: Increased time with cues for sequence and use of R LE to self assist.  Physical assist to manage L LE and to control trunk    Transfers Overall transfer level: Needs assistance Equipment used: Rolling walker (2 wheeled) Transfers: Sit to/from [employer] Sit to Stand: Mod assist;+2 physical assistance;+2 safety/equipment;From elevated surface Stand pivot transfers: Mod assist;+2 physical assistance;+2 safety/equipment;From elevated surface       General transfer comment: cues for LE management and use of UEs to self assist.  Physical assist to bring wt up and fwd and to balance in standing with RW; Stand/pvt with RW bed to chair  Ambulation/Gait             General Gait Details: Pt unable to offload wt onto UEs sufficiently to safely attempt step with R LE  Stairs            Wheelchair Mobility    Modified Rankin (Stroke Patients Only)       Balance Overall balance assessment:  Needs assistance Sitting-balance support: Feet supported;No upper extremity supported Sitting balance-Leahy Scale: Fair     Standing balance support: Bilateral upper extremity supported Standing balance-Leahy Scale: Poor                               Pertinent Vitals/Pain Pain Assessment: 0-10 Pain Score: 5  Pain Location: leg pain Pain Descriptors / Indicators: Aching Pain Intervention(s): Limited activity within patient's tolerance;Monitored during session;Premedicated before session;Ice applied    Home Living Family/patient expects to be discharged to:: Skilled nursing facility Living Arrangements: Alone               Additional Comments: Lives alone    Prior Function Level of Independence: Needs assistance   Gait / Transfers Assistance Needed: ambulatory with RW  ADL's / Homemaking Assistance Needed: PCA daily but not 24/7        Hand Dominance   Dominant Hand: Right    Extremity/Trunk Assessment   Upper Extremity Assessment Upper Extremity Assessment: Generalized weakness    Lower Extremity Assessment Lower Extremity Assessment: Generalized weakness;LLE deficits/detail LLE Deficits / Details: KI in place       Communication   Communication: HOH  Cognition Arousal/Alertness: Awake/alert Behavior During Therapy: WFL for tasks assessed/performed Overall Cognitive Status: Within Functional Limits for tasks assessed  General Comments      Exercises     Assessment/Plan    PT Assessment Patient needs continued PT services  PT Problem List Decreased strength;Decreased range of motion;Decreased activity tolerance;Decreased balance;Decreased mobility;Decreased knowledge of use of DME;Pain       PT Treatment Interventions DME instruction;Gait training;Stair training;Functional mobility training;Therapeutic activities;Therapeutic exercise;Balance training;Patient/family education     PT Goals (Current goals can be found in the Care Plan section)  Acute Rehab PT Goals Patient Stated Goal: Regain IND PT Goal Formulation: With patient Time For Goal Achievement: 08/21/20 Potential to Achieve Goals: Fair    Frequency Min 3X/week   Barriers to discharge Decreased caregiver support Lives alone with part-time PCA    Co-evaluation               AM-PAC PT "6 Clicks" Mobility  Outcome Measure Help needed turning from your back to your side while in a flat bed without using bedrails?: A Lot Help needed moving from lying on your back to sitting on the side of a flat bed without using bedrails?: A Lot Help needed moving to and from a bed to a chair (including a wheelchair)?: A Lot Help needed standing up from a chair using your arms (e.g., wheelchair or bedside chair)?: A Lot Help needed to walk in hospital room?: Total Help needed climbing 3-5 steps with a railing? : Total 6 Click Score: 10    End of Session Equipment Utilized During Treatment: Gait belt;Left knee immobilizer Activity Tolerance: Patient limited by fatigue Patient left: in chair;with call bell/phone within reach;with chair alarm set Nurse Communication: Mobility status PT Visit Diagnosis: Unsteadiness on feet (R26.81);Muscle weakness (generalized) (M62.81);Difficulty in walking, not elsewhere classified (R26.2);Pain Pain - Right/Left: Left Pain - part of body: Leg    Time: 2130-8657 PT Time Calculation (min) (ACUTE ONLY): 39 min   Charges:   PT Evaluation $PT Eval Low Complexity: 1 Low PT Treatments $Therapeutic Activity: 8-22 mins        Mauro Kaufmann PT Acute Rehabilitation Services Pager [phone] Office [phone]   Pruitt Taboada 08/14/2020, 12:49 PM

## 2020-08-14 NOTE — Progress Notes (Signed)
Patient is adamant that nursing staff cannot move her. She reports that only PT can move her. She continues to say "all I know is the surgeons assistant told me it is very important that no one move me except PT".   She has asked me to put her back to bed 3 times. I present to the room to assist her, and she asks if therapy is coming.  I have assured her that we understand the MD orders about her mobility, and we are trained to move her, and assist her back to bed.   She continues to ask for PT.   Education provided again for the 4th time today.   Will continue to monitor, and provide patient education, as well as provide assurance.     SWhittemore, Charity fundraiser

## 2020-08-14 NOTE — Progress Notes (Signed)
Orthopedic Tech Progress Note Patient Details:  Christine Jennings 05/09/1927 [id number]  Ortho Devices Type of Ortho Device: Knee Immobilizer Ortho Device/Splint Location: left cam walker Ortho Device/Splint Interventions: Application   Post Interventions Patient Tolerated: Well Instructions Provided: Care of device  Saul Fordyce 08/14/2020, 10:20 AM

## 2020-08-14 NOTE — Op Note (Signed)
OPERATIVE REPORT   08/13/2020  PATIENT:  Christine Jennings   SURGEON:  Jonette Pesa, MD  ASSISTANT:  Barrie Dunker, PA-C.   PREOPERATIVE DIAGNOSIS: Intra-articular left distal femur fracture.  POSTOPERATIVE DIAGNOSIS:  Same.  PROCEDURE: Open reduction internal fixation left femur.  ANESTHESIA:   GETA.  ANTIBIOTICS: 2 g Ancef.  IMPLANTS: Zimmer NCB periprosthetic distal femur locking plate, size 9 hole. 5.0 mm distal interlocking screw with locking caps x4. 5.0 mm proximal interlocking screw with locking caps x3. 4.0 mm proximal interlocking screw x1. 4.0 cannulated lag screw x3  SPECIMENS: None.  COMPLICATIONS: None.  DISPOSITION: Stable to PACU.  SURGICAL INDICATIONS:  Christine Jennings is a 87 y.o. female who had a ground-level fall at home.  She had left knee pain and inability to weight-bear.  She was brought to the emergency department at [facility], where x-rays and a CT scan revealed a displaced fracture of the lateral femoral condyle of the distal femur.  She was indicated for open reduction internal fixation.  The risks, benefits, and alternatives were discussed with the patient preoperatively including but not limited to the risks of infection, bleeding, nerve / blood vessel injury, malunion, nonunion, cardiopulmonary complications, the need for repeat surgery, among others, and the patient was willing to proceed.  PROCEDURE IN DETAIL: The patient was identified in the holding area using 2 identifiers.  The surgical site was marked by myself.  She was taken to the operating room, and placed supine on the operating room table.  All bony prominences were padded.  A bump was placed under the left hip.  The left lower extremity was prepped and draped in the normal sterile surgical fashion.  Timeout was called, verifying site and site of surgery.  She did receive IV antibiotics within 60 minutes of beginning the procedure.  The knee was flexed over a  padded triangle.  I made a swash Buckler incision over the distal femur.  Arthrotomy was created over the lateral aspect of the patellar tendon and carried longitudinally through the IT band.  The vastus lateralis was divided from the intermuscular septum and retracted anteriorly.  The patella was subluxated medially.  The fracture was identified.  There was an intra-articular fracture of the lateral femoral condyle.  The fracture site was irrigated to clear hematoma and debris.  The fracture was reduced and held with a point-to-point clamp.  Guidepins x3 were placed for cannulated screws.  The pins were sequentially measured, the near fragment was drilled, and the leg screws were placed.  Reduction of the fracture was anatomic.  At this point, the 9 hole NCB distal femur plate was selected and the jig was attached.  The plate was slid into place submuscularly.  The plate was held distally with a K wire.  Proximally a 3.3 mm drill bit was used to secure the plate to the femur.  The position of the plate was confirmed on AP and lateral fluoroscopy views.  I placed a total of 4 distal screws.  I placed 3 proximal bicortical screws through stab incisions.  The drill bit was exchanged for a 4 mm screw.  Locking caps were applied to the distal screws and 3 of the proximal screws.  Final AP and lateral fluoroscopy views were used to confirm fracture reduction and hardware placement.  The wound was copiously irrigated with normal saline.  Vancomycin powder was placed deep to the fascia.  The arthrotomy and IT band were repaired with #1 Vicryl  and #1 strata fix suture.  Deep dermal layer was closed with 2 oh Dermabond.  The skin was reapproximated with staples.  Dermabond was applied to the skin.  Once the glue was dried, an Aquacel Ag dressing was applied.  Bulky dressing was applied.  A knee immobilizer was applied.  The patient was then awakened from anesthesia and taken to the PACU in stable condition.  Sponge,  needle, and instrument counts were correct at the end of the case x2.  There were no known complications.  POSTOPERATIVE PLAN: Postoperatively, the patient be readmitted to the hospitalist.  A T ROM brace has been ordered.  Once the brace arrives, the knee immobilizer will be discontinued.  The brace will be locked in extension except for physical therapy, and she can have passive range of motion from 0 to 90 degrees.  Touchdown weightbearing left lower extremity.  Begin Lovenox for DVT prophylaxis.  She will undergo disposition planning.  Return to the office in 2 weeks for routine postoperative care.

## 2020-08-14 NOTE — Progress Notes (Signed)
    Subjective:  Patient reports pain as mild to moderate.  Denies N/V/CP/SOB.   Objective:   VITALS:   Vitals:   08/13/20 1959 08/13/20 2153 08/14/20 0206 08/14/20 0704  BP:  (!) 151/72 (!) 146/71 (!) 142/70  Pulse:  88 87 90  Resp:  18 18 16   Temp:  98.6 F (37 C) 98.6 F (37 C) 99 F (37.2 C)  TempSrc:  Axillary Oral Oral  SpO2: 99% 100% 100% 99%  Weight:      Height:        NAD ABD soft Neurovascular intact Sensation intact distally Intact pulses distally Dorsiflexion/Plantar flexion intact Incision: dressing C/D/I Knee immobilizer in place   Lab Results  Component Value Date   WBC 7.9 08/14/2020   HGB 12.1 08/14/2020   HCT 36.4 08/14/2020   MCV 90.8 08/14/2020   PLT 145 (L) 08/14/2020   BMET    Component Value Date/Time   NA 139 08/14/2020 0315   K 4.4 08/14/2020 0315   CL 104 08/14/2020 0315   CO2 26 08/14/2020 0315   GLUCOSE 143 (H) 08/14/2020 0315   BUN 14 08/14/2020 0315   CREATININE 0.44 08/14/2020 0315   CALCIUM 8.0 (L) 08/14/2020 0315   GFRNONAA >60 08/14/2020 0315   GFRAA >60 07/02/2018 0252     Assessment/Plan: 1 Day Post-Op   Active Problems:   Femur fracture (HCC)   TDWB LLE DVT ppx: Lovenox, SCDs, TEDS PO pain control PT/OT T-Rom brace ordered. Change from knee immobilizer to t-rom once it arrives. Lock in extension except for physical therapy where passive ROM 0-90 is allowed Dispo: Pending. Follow up with Dr.Swinteck in 2 weeks for incision check and radiographs     07/04/2018 08/14/2020, 10:52 AM [facility] is now [facility] [address], [place], [place] Phone: [phone] [email or web address] Facebook  [phone]

## 2020-08-14 NOTE — Progress Notes (Signed)
  Speech Language Pathology Treatment: Dysphagia  Patient Details Name: Christine Jennings MRN: [id number] DOB: 1927-11-05 Today's Date: 08/14/2020 Time: 1448-1856 SLP Time Calculation (min) (ACUTE ONLY): 8 min  Assessment / Plan / Recommendation Clinical Impression  Pt did not care for pot roast at lunch and reported she prefers her meats to be chopped. No indications of aspiration with large sips thin- encouraged her to take smaller sips which she typically does. Pt hungry and this therapist assisted in ordering different lunch meal. No further ST needed at this time.    HPI HPI: 87 year old female with history of previous multiple orthopedic surgery, lives at home alone and walks with walker fell while trying to get up from the toilet and injured her left knee.   Found to have left distal knee fracture.  Orthopedic consulted and admit to the hospital.   Pt noted to have problems swallowing and SLP consult was ordered.  Prior h/o COPD noted per prior imaging of chest.      SLP Plan  All goals met;Discharge SLP treatment due to (comment)       Recommendations  Diet recommendations: Dysphagia 3 (mechanical soft);Thin liquid (per pt request) Liquids provided via: Cup;Straw Medication Administration: Whole meds with puree Supervision: Patient able to self feed Compensations: Small sips/bites;Slow rate Postural Changes and/or Swallow Maneuvers: Seated upright 90 degrees                Oral Care Recommendations: Oral care BID SLP Visit Diagnosis: Dysphagia, oropharyngeal phase (R13.12);Dysphagia, unspecified (R13.10) Plan: All goals met;Discharge SLP treatment due to (comment)       GO                Houston Siren 08/14/2020, 1:16 PM

## 2020-08-15 DIAGNOSIS — S72402A Unspecified fracture of lower end of left femur, initial encounter for closed fracture: Secondary | ICD-10-CM | POA: Diagnosis not present

## 2020-08-15 LAB — BASIC METABOLIC PANEL
Anion gap: 5 (ref 5–15)
BUN: 19 mg/dL (ref 8–23)
CO2: 27 mmol/L (ref 22–32)
Calcium: 8.1 mg/dL — ABNORMAL LOW (ref 8.9–10.3)
Chloride: 102 mmol/L (ref 98–111)
Creatinine, Ser: 0.41 mg/dL — ABNORMAL LOW (ref 0.44–1.00)
GFR, Estimated: >60 mL/min (ref >60)
Glucose, Bld: 97 mg/dL (ref 70–99)
Potassium: 3.7 mmol/L (ref 3.5–5.1)
Sodium: 134 mmol/L — ABNORMAL LOW (ref 135–145)

## 2020-08-15 LAB — CBC
HCT: 31.3 % — ABNORMAL LOW (ref 36.0–46.0)
Hemoglobin: 10.3 g/dL — ABNORMAL LOW (ref 12.0–15.0)
MCH: 30.6 pg (ref 26.0–34.0)
MCHC: 32.9 g/dL (ref 30.0–36.0)
MCV: 92.9 fL (ref 80.0–100.0)
Platelets: 155 10*3/uL (ref 150–400)
RBC: 3.37 MIL/uL — ABNORMAL LOW (ref 3.87–5.11)
RDW: 14.6 % (ref 11.5–15.5)
WBC: 6.8 10*3/uL (ref 4.0–10.5)
nRBC: 0 % (ref 0.0–0.2)

## 2020-08-15 LAB — SARS CORONAVIRUS 2 (TAT 6-24 HRS): SARS Coronavirus 2: NEGATIVE

## 2020-08-15 MED ORDER — ENOXAPARIN SODIUM 40 MG/0.4ML IJ SOSY: 40.0000 mg | PREFILLED_SYRINGE | INTRAMUSCULAR | 0 refills | Status: DC

## 2020-08-15 MED ORDER — HYDROCODONE-ACETAMINOPHEN 5-325 MG PO TABS: 1.0000 | ORAL_TABLET | Freq: Four times a day (QID) | ORAL | 0 refills | Status: DC | PRN

## 2020-08-15 NOTE — Plan of Care (Signed)
  Problem: Clinical Measurements: Goal: Will remain free from infection Outcome: Progressing   Problem: Activity: Goal: Risk for activity intolerance will decrease Outcome: Progressing   Problem: Nutrition: Goal: Adequate nutrition will be maintained Outcome: Progressing   Problem: Pain Managment: Goal: General experience of comfort will improve Outcome: Progressing   Problem: Safety: Goal: Ability to remain free from injury will improve Outcome: Progressing   

## 2020-08-15 NOTE — Progress Notes (Signed)
PROGRESS NOTE    Christine Jennings  NWG:[id number] DOB: [birth year] DOA: 08/09/2020 PCP: Dois Davenport, MD    Brief Narrative:  87 year old female with history of previous multiple orthopedic surgery, lives at home alone and walks with walker fell while trying to get up from the toilet and injured her left knee.  In the emergency room hemodynamically stable.  Found to have left distal femur fracture with intra articular extension. Orthopedic consulted and admitted to the hospital. 8/10, ORIF with plate and screw left distal femur.  Assessment & Plan:   Active Problems:   Femur fracture (HCC)  Closed traumatic left distal femur fracture: ORIF Dr. Linna Caprice 8/10 Toe-touch weightbearing Adequate pain medications with IV and oral opiates with laxatives.  Currently able to manage pain with oral oxycodone. Lovenox for DVT prophylaxis. Work with PT OT.  Refer to SNF.  Should be able to go to SNF when bed available. Hinged brace to keep in extension. ROM with PT only.  Osteoporosis: On Fosamax and vitamin D.  Resume on discharge.  Dysphagia: Seen by speech therapy.  Remains on dysphagia 3 diet with aspiration precautions.  Acute anemia of blood loss: expected from long bone fracture.  Stable.  No indication for blood transfusion.   Sinus tachycardia: likely due to pain and blood loss anemia. TSH normal.  2 D Echo apparently normal. Monitor.  Resolved.   DVT prophylaxis: enoxaparin (LOVENOX) injection 40 mg Start: 08/14/20 0800 SCDs Start: 08/13/20 1927   Code Status: DNR Family Communication: None. Disposition Plan: Status is: Inpatient  Remains inpatient appropriate because:Unsafe d/c plan and Inpatient level of care appropriate due to severity of illness  Dispo: The patient is from: Home              Anticipated d/c is to: SNF               Patient currently is medically stable to transfer to skilled level of care.   Difficult to place patient No  Consultants:   Orthopedics  Procedures:  None  Antimicrobials:  None   Subjective:  Patient seen and examined.  No overnight events.  She was wondering whether she is getting rehab today.  Knee hurts.  Objective: Vitals:   08/14/20 0206 08/14/20 0704 08/14/20 2310 08/15/20 0548  BP: (!) 146/71 (!) 142/70 (!) 122/51 136/63  Pulse: 87 90 86 100  Resp: 18 16 16 16   Temp: 98.6 F (37 C) 99 F (37.2 C) 99.4 F (37.4 C) 99.1 F (37.3 C)  TempSrc: Oral Oral Oral Oral  SpO2: 100% 99% 92% 93%  Weight:      Height:        Intake/Output Summary (Last 24 hours) at 08/15/2020 1311 Last data filed at 08/15/2020 1129 Gross per 24 hour  Intake 521.51 ml  Output 1100 ml  Net -578.49 ml   Filed Weights   08/09/20 1028 08/09/20 1429  Weight: 49.9 kg 42.6 kg    Examination:  General: Frail and debilitated but age appropriate.  Appropriately anxious. Cardiovascular: S1-S2 normal.  No added sounds. Respiratory: Bilateral clear.  No added sounds. Gastrointestinal: Soft and nontender.  Bowel sounds present. Ext: Left lower extremity on knee immobilizer and hinged brace. Distal neurovascular status intact.  Trace edema left dorsum of the foot. Post op dressing and compression bandage not removed by me. Neuro: No focal neurological deficits.      Data Reviewed: I have personally reviewed following labs and imaging studies  CBC: Recent Labs  Lab 08/09/20 1110 08/10/20 0222 08/12/20 0301 08/13/20 0314 08/13/20 1948 08/14/20 0315 08/15/20 0320  WBC 14.1*   < > 6.6 6.2 9.0 7.9 6.8  NEUTROABS 12.9*  --  4.8 4.2  --   --   --   HGB 13.7   < > 8.7* 8.9* 12.4 12.1 10.3*  HCT 42.0   < > 27.8* 28.1* 38.2 36.4 31.3*  MCV 94.4   < > 99.3 96.6 91.8 90.8 92.9  PLT 172   < > 103* 143* 139* 145* 155   < > = values in this interval not displayed.   Basic Metabolic Panel: Recent Labs  Lab 08/09/20 1110 08/10/20 0222 08/13/20 1948 08/14/20 0315 08/15/20 0320  NA 140 134*  --  139 134*  K 3.7  4.1  --  4.4 3.7  CL 100 100  --  104 102  CO2 29 28  --  26 27  GLUCOSE 171* 177*  --  143* 97  BUN 25* 32*  --  14 19  CREATININE 0.64 0.91 0.51 0.44 0.41*  CALCIUM 9.4 8.6*  --  8.0* 8.1*   GFR: Estimated Creatinine Clearance: 30.8 mL/min (A) (by C-G formula based on SCr of 0.41 mg/dL (L)). Liver Function Tests: No results for input(s): AST, ALT, ALKPHOS, BILITOT, PROT, ALBUMIN in the last 168 hours. No results for input(s): LIPASE, AMYLASE in the last 168 hours. No results for input(s): AMMONIA in the last 168 hours. Coagulation Profile: Recent Labs  Lab 08/09/20 1110  INR 0.9   Cardiac Enzymes: No results for input(s): CKTOTAL, CKMB, CKMBINDEX, TROPONINI in the last 168 hours. BNP (last 3 results) No results for input(s): PROBNP in the last 8760 hours. HbA1C: No results for input(s): HGBA1C in the last 72 hours. CBG: No results for input(s): GLUCAP in the last 168 hours. Lipid Profile: No results for input(s): CHOL, HDL, LDLCALC, TRIG, CHOLHDL, LDLDIRECT in the last 72 hours. Thyroid Function Tests: No results for input(s): TSH, T4TOTAL, FREET4, T3FREE, THYROIDAB in the last 72 hours.  Anemia Panel: No results for input(s): VITAMINB12, FOLATE, FERRITIN, TIBC, IRON, RETICCTPCT in the last 72 hours.  Sepsis Labs: No results for input(s): PROCALCITON, LATICACIDVEN in the last 168 hours.  Recent Results (from the past 240 hour(s))  SARS CORONAVIRUS 2 (TAT 6-24 HRS) Nasopharyngeal Nasopharyngeal Swab     Status: None   Collection Time: 08/09/20 11:10 AM   Specimen: Nasopharyngeal Swab  Result Value Ref Range Status   SARS Coronavirus 2 NEGATIVE NEGATIVE Final    Comment: (NOTE) SARS-CoV-2 target nucleic acids are NOT DETECTED.  The SARS-CoV-2 RNA is generally detectable in upper and lower respiratory specimens during the acute phase of infection. Negative results do not preclude SARS-CoV-2 infection, do not rule out co-infections with other pathogens, and should not  be used as the sole basis for treatment or other patient management decisions. Negative results must be combined with clinical observations, patient history, and epidemiological information. The expected result is Negative.  Fact Sheet for Patients: HairSlick.no  Fact Sheet for Healthcare Providers: quierodirigir.com  This test is not yet approved or cleared by the [place] FDA and  has been authorized for detection and/or diagnosis of SARS-CoV-2 by FDA under an Emergency Use Authorization (EUA). This EUA will remain  in effect (meaning this test can be used) for the duration of the COVID-19 declaration under Se ction 564(b)(1) of the Act, 21 U.S.C. section 360bbb-3(b)(1), unless the authorization is terminated or revoked sooner.  Performed at  [facility], [address], [place], [place] [address]   Surgical pcr screen     Status: None   Collection Time: 08/12/20 10:23 AM   Specimen: Nasal Mucosa; Nasal Swab  Result Value Ref Range Status   MRSA, PCR NEGATIVE NEGATIVE Final   Staphylococcus aureus NEGATIVE NEGATIVE Final    Comment: (NOTE) The Xpert SA Assay (FDA approved for NASAL specimens in patients 10 years of age and older), is one component of a comprehensive surveillance program. It is not intended to diagnose infection nor to guide or monitor treatment. Performed at [facility], [address] [place], [place] [address]          Radiology Studies: DG C-Arm 1-60 Min  Result Date: 08/13/2020 CLINICAL DATA:  Surgery EXAM: LEFT FEMUR 2 VIEWS; DG C-ARM 1-60 MIN COMPARISON:  Plain films 08/09/2020 FINDINGS: Plate and screw fixation device noted across the distal left femoral fracture. Anatomic alignment. No hardware complicating feature. IMPRESSION: Internal fixation across the distal femoral fracture. No visible complicating feature. Electronically Signed   By: Charlett Nose M.D.    On: 08/13/2020 17:21   DG FEMUR MIN 2 VIEWS LEFT  Result Date: 08/13/2020 CLINICAL DATA:  Surgery EXAM: LEFT FEMUR 2 VIEWS; DG C-ARM 1-60 MIN COMPARISON:  Plain films 08/09/2020 FINDINGS: Plate and screw fixation device noted across the distal left femoral fracture. Anatomic alignment. No hardware complicating feature. IMPRESSION: Internal fixation across the distal femoral fracture. No visible complicating feature. Electronically Signed   By: Charlett Nose M.D.   On: 08/13/2020 17:21   DG FEMUR PORT MIN 2 VIEWS LEFT  Result Date: 08/13/2020 CLINICAL DATA:  Status post ORIF distal femur fracture EXAM: LEFT FEMUR PORTABLE 2 VIEWS COMPARISON:  CT left knee dated 08/09/2020 FINDINGS: Status post lateral compression plate and screw fixation of the lateral femoral condyle. Fracture fragment is in anatomic alignment and position. Associated mild soft tissue swelling with overlying skin staples. Prior ORIF of a proximal femur fracture, without evidence of complication. IMPRESSION: Status post ORIF of the lateral femoral condyle, as above. Electronically Signed   By: Charline Bills M.D.   On: 08/13/2020 20:41        Scheduled Meds:  calcium carbonate  1 tablet Oral Q breakfast   cholecalciferol  400 Units Oral Daily   docusate sodium  100 mg Oral BID   enoxaparin (LOVENOX) injection  40 mg Subcutaneous Q24H   feeding supplement  1 Container Oral Q24H   feeding supplement  237 mL Oral BID BM   senna  1 tablet Oral BID   Continuous Infusions:     LOS: 6 days    Time spent: 25 minutes    Dorcas Carrow, MD [facility] Pager [phone]

## 2020-08-15 NOTE — TOC Progression Note (Signed)
Transition of Care [facility]) - Progression Note    Patient Details  Name: Christine Jennings MRN: [id number] Date of Birth: [birth year]  [facility]) CM/SW Contact  Amada Jupiter, LCSW Phone Number: 08/15/2020, 3:17 PM  Clinical Narrative:    Pt has accepted SNF bed offer [facility] who can admit pt tomorrow morning.  MD aware.  Will assist with transfer via PTAR in the a.m.   Expected Discharge Plan: Skilled Nursing Facility Barriers to Discharge: Continued Medical Work up  Expected Discharge Plan and Services Expected Discharge Plan: Skilled Nursing Facility       Living arrangements for the past 2 months: Single Family Home                 DME Arranged: N/A DME Agency: NA                   Social Determinants of Health (SDOH) Interventions    Readmission Risk Interventions No flowsheet data found.

## 2020-08-16 DIAGNOSIS — S72402A Unspecified fracture of lower end of left femur, initial encounter for closed fracture: Secondary | ICD-10-CM | POA: Diagnosis not present

## 2020-08-16 LAB — CBC
HCT: 30.7 % — ABNORMAL LOW (ref 36.0–46.0)
Hemoglobin: 10 g/dL — ABNORMAL LOW (ref 12.0–15.0)
MCH: 30 pg (ref 26.0–34.0)
MCHC: 32.6 g/dL (ref 30.0–36.0)
MCV: 92.2 fL (ref 80.0–100.0)
Platelets: 174 10*3/uL (ref 150–400)
RBC: 3.33 MIL/uL — ABNORMAL LOW (ref 3.87–5.11)
RDW: 14.1 % (ref 11.5–15.5)
WBC: 6.7 10*3/uL (ref 4.0–10.5)
nRBC: 0 % (ref 0.0–0.2)

## 2020-08-16 MED ORDER — BISACODYL 10 MG RE SUPP
10.0000 mg | Freq: Once | RECTAL | Status: AC
  Administered 2020-08-16: 10 mg via RECTAL
  Filled 2020-08-16: qty 1

## 2020-08-16 MED ORDER — POLYETHYLENE GLYCOL 3350 17 G PO PACK
17.0000 g | PACK | Freq: Every day | ORAL | Status: DC
  Administered 2020-08-17: 17 g via ORAL
  Filled 2020-08-16: qty 1

## 2020-08-16 MED ORDER — POLYETHYLENE GLYCOL 3350 17 G PO PACK
17.0000 g | PACK | Freq: Once | ORAL | Status: AC
  Administered 2020-08-16: 17 g via ORAL
  Filled 2020-08-16: qty 1

## 2020-08-16 MED ORDER — ENOXAPARIN SODIUM 40 MG/0.4ML IJ SOSY: 30.0000 mg | PREFILLED_SYRINGE | INTRAMUSCULAR | 0 refills | Status: DC

## 2020-08-16 NOTE — Discharge Summary (Addendum)
Physician Discharge Summary  ELAJAH KUNSMAN ZOX:[id number] DOB: June 06, 1927 DOA: 08/09/2020  PCP: Dois Davenport, MD  Admit date: 08/09/2020 Discharge date:08/17/20  Admitted From: Home Disposition: Skilled nursing facility  Recommendations for Outpatient Follow-up:  Follow up with PCP in 1-2 weeks Please obtain BMP/CBC in one week Schedule follow-up with orthopedics in 2 weeks  Home Health: Not applicable Equipment/Devices: Not applicable  Discharge Condition: Stable CODE STATUS: Full Code  Diet recommendation: regular diet  Discharge Summary: 87 year old female with history of previous multiple orthopedic surgery, lives at home alone and walks with walker fell while trying to get up from the toilet and injured her left knee.  In the emergency room hemodynamically stable.  Found to have left distal femur fracture with intra articular extension. Orthopedic consulted and admitted to the hospital. 8/10, ORIF with plate and screw left distal femur. Addendum 8/14-no overnight issues. Pt remained stable and ok for discharge to SNF today   Assessment & Plan:   Active Problems:   Femur fracture (HCC)   Closed traumatic left distal femur fracture: ORIF Dr. Linna Caprice 8/10 Toe-touch weightbearing Adequate pain medications with oral opiates with laxatives.  Currently able to manage pain with oral oxycodone. Lovenox for DVT prophylaxis. Work with PT OT.  Refer to SNF.   Hinged brace to keep in extension. ROM left knee with PT only.   Osteoporosis: On Fosamax and vitamin D.  Resume on discharge.   Dysphagia: Seen by speech therapy.  Remains on dysphagia 3 diet with aspiration precautions.   Acute anemia of blood loss: expected from long bone fracture.  Stable.  No indication for blood transfusion.  Recheck in 1 week.   Sinus tachycardia: likely due to pain and blood loss anemia. TSH normal.  2 D Echo apparently normal. Monitor.  Resolved.  Patient is medically stable to transfer to  skilled level of care.   Discharge Diagnoses:  Active Problems:   Femur fracture [facility])    Discharge Instructions  Discharge Instructions     Call MD for:  redness, tenderness, or signs of infection (pain, swelling, redness, odor or green/yellow discharge around incision site)   Complete by: As directed    Call MD for:  severe uncontrolled pain   Complete by: As directed    Call MD for:  temperature >100.4   Complete by: As directed    Diet general   Complete by: As directed    Discharge instructions   Complete by: As directed    Follow left lower extremity precautions as in the discharge summary.   Increase activity slowly   Complete by: As directed    No wound care   Complete by: As directed    Reinforce dressing.  Keep it dry and clean.      Allergies as of 08/16/2020   No Known Allergies      Medication List     STOP taking these medications    Vitamin D (Ergocalciferol) 1.25 MG (50000 UNIT) Caps capsule Commonly known as: DRISDOL       TAKE these medications    alendronate 70 MG tablet Commonly known as: FOSAMAX Take 70 mg by mouth every Monday. Take with a full glass of water on an empty stomach.   amoxicillin 500 MG capsule Commonly known as: AMOXIL Take 1,000 mg by mouth daily as needed (prior to dental work).   aspirin EC 81 MG tablet Take 81 mg by mouth daily. Swallow whole.   CALCIUM 500 +D PO Take 1  tablet by mouth daily.   docusate sodium 100 MG capsule Commonly known as: COLACE Take 1 capsule (100 mg total) by mouth 2 (two) times daily.   enoxaparin 40 MG/0.4ML injection Commonly known as: LOVENOX Inject 0.3 mLs (30 mg total) into the skin daily.   feeding supplement Liqd Take 237 mLs by mouth 2 (two) times daily between meals.   HYDROcodone-acetaminophen 5-325 MG tablet Commonly known as: NORCO/VICODIN Take 1-2 tablets by mouth every 6 (six) hours as needed for moderate pain. What changed:  when to take this reasons to take  this   polyethylene glycol 17 g packet Commonly known as: MIRALAX / GLYCOLAX Take 17 g by mouth daily as needed for mild constipation.        Contact information for follow-up providers     Swinteck, Arlys John, MD. Schedule an appointment as soon as possible for a visit in 2 week(s).   Specialty: Orthopedic Surgery Why: For suture removal, For wound re-check Contact information: [address] [place] [address] [phone]              Contact information for after-discharge care     Destination     [facility] Preferred SNF .   Service: Skilled Nursing Contact information: [address] [place] [address] [phone]                    No Known Allergies  Consultations: Orthopedics   Procedures/Studies: CT KNEE LEFT WO CONTRAST  Result Date: 08/09/2020 CLINICAL DATA:  Evaluate left femur fracture. EXAM: CT OF THE left KNEE WITHOUT CONTRAST TECHNIQUE: Multidetector CT imaging of the left knee was performed according to the standard protocol. Multiplanar CT image reconstructions were also generated. COMPARISON:  Radiographs, same date. FINDINGS: As demonstrated on the radiographs there is a largely longitudinal fracture through the base of the lateral femoral condyle extending to the articular surface at the intercondylar notch. Maximum displacement is 12 mm. The medial femoral condyle is intact. No patella, tibia or fibula fractures. There is a large associated lipohemarthrosis. Significant underlying osteoporosis. Grossly by CT the cruciate and collateral ligaments are intact. The quadriceps and patellar tendons are intact. Moderate vascular calcifications are noted. IMPRESSION: 1. Largely longitudinal fracture through the base of the lateral femoral condyle extending to the articular surface at the intercondylar notch. Maximum displacement is 12 mm. 2. Large associated lipohemarthrosis. 3. No other fractures  are identified. 4. Significant underlying osteoporosis. Electronically Signed   By: Rudie Meyer M.D.   On: 08/09/2020 13:30   DG Knee Complete 4 Views Left  Result Date: 08/09/2020 CLINICAL DATA:  Left knee pain after falling in the bathroom this morning. EXAM: LEFT KNEE - COMPLETE 4+ VIEW COMPARISON:  None. FINDINGS: There is an acute comminuted fracture of the distal left femur which involves the transcondylar axis and extends to the articular surface of the lateral condyle. There is approximately 1.3 cm lateral displacement of the lateral femoral condyle. There is a moderate lipohemarthrosis. IMPRESSION: Comminuted, intra-articular distal left femoral fracture. Electronically Signed   By: Romona Curls M.D.   On: 08/09/2020 12:13   DG C-Arm 1-60 Min  Result Date: 08/13/2020 CLINICAL DATA:  Surgery EXAM: LEFT FEMUR 2 VIEWS; DG C-ARM 1-60 MIN COMPARISON:  Plain films 08/09/2020 FINDINGS: Plate and screw fixation device noted across the distal left femoral fracture. Anatomic alignment. No hardware complicating feature. IMPRESSION: Internal fixation across the distal femoral fracture. No visible complicating feature. Electronically  Signed   By: Charlett NoseKevin  Dover M.D.   On: 08/13/2020 17:21   ECHOCARDIOGRAM COMPLETE  Result Date: 08/11/2020    ECHOCARDIOGRAM REPORT   Patient Name:   Kathrene BongoILEEN M Matsen Date of Exam: 08/11/2020 Medical Rec #:  [id number]         Height:       60.0 in Accession #:    [id number]        Weight:       93.9 lb Date of Birth:  Oct 03, 1927         BSA:          1.354 m Patient Age:    89 years          BP:           102/60 mmHg Patient Gender: F                 HR:           105 bpm. Exam Location:  Inpatient Procedure: 2D Echo, Cardiac Doppler and Color Doppler Indications:    R07.9* Chest pain, unspecified  History:        Patient has prior history of Echocardiogram examinations, most                 recent 01/27/2018.  Sonographer:    Eulah PontSarah Pirrotta RDCS Referring Phys: [id number] Dorcas CarrowKUBER  GHIMIRE IMPRESSIONS  1. Left ventricular ejection fraction, by estimation, is 70 to 75%. The left ventricle has hyperdynamic function. The left ventricle has no regional wall motion abnormalities. Left ventricular diastolic parameters are consistent with Grade I diastolic dysfunction (impaired relaxation).  2. Right ventricular systolic function is normal. The right ventricular size is normal. There is normal pulmonary artery systolic pressure.  3. The mitral valve is normal in structure. No evidence of mitral valve regurgitation. No evidence of mitral stenosis.  4. The aortic valve is calcified. Aortic valve regurgitation is trivial. No aortic stenosis is present.  5. The inferior vena cava is normal in size with greater than 50% respiratory variability, suggesting right atrial pressure of 3 mmHg. FINDINGS  Left Ventricle: Left ventricular ejection fraction, by estimation, is 70 to 75%. The left ventricle has hyperdynamic function. The left ventricle has no regional wall motion abnormalities. The left ventricular internal cavity size was normal in size. There is no left ventricular hypertrophy. Left ventricular diastolic parameters are consistent with Grade I diastolic dysfunction (impaired relaxation). Right Ventricle: The right ventricular size is normal. Right ventricular systolic function is normal. There is normal pulmonary artery systolic pressure. The tricuspid regurgitant velocity is 2.42 m/s, and with an assumed right atrial pressure of 3 mmHg,  the estimated right ventricular systolic pressure is 26.4 mmHg. Left Atrium: Left atrial size was normal in size. Right Atrium: Right atrial size was normal in size. Pericardium: There is no evidence of pericardial effusion. Mitral Valve: The mitral valve is normal in structure. Mild mitral annular calcification. No evidence of mitral valve regurgitation. No evidence of mitral valve stenosis. Tricuspid Valve: The tricuspid valve is normal in structure. Tricuspid  valve regurgitation is trivial. No evidence of tricuspid stenosis. Aortic Valve: The aortic valve is calcified. Aortic valve regurgitation is trivial. Aortic regurgitation PHT measures 496 msec. No aortic stenosis is present. Pulmonic Valve: The pulmonic valve was normal in structure. Pulmonic valve regurgitation is not visualized. No evidence of pulmonic stenosis. Aorta: The aortic root is normal in size and structure. Venous: The inferior vena cava is normal in size with  greater than 50% respiratory variability, suggesting right atrial pressure of 3 mmHg. IAS/Shunts: No atrial level shunt detected by color flow Doppler.  LEFT VENTRICLE PLAX 2D LVIDd:         2.50 cm LVIDs:         1.60 cm LV PW:         1.00 cm LV IVS:        1.10 cm LVOT diam:     2.10 cm LV SV:         83 LV SV Index:   61 LVOT Area:     3.46 cm  RIGHT VENTRICLE RV S prime:     19.50 cm/s TAPSE (M-mode): 1.6 cm LEFT ATRIUM             Index       RIGHT ATRIUM          Index LA diam:        3.00 cm 2.22 cm/m  RA Area:     8.43 cm LA Vol (A2C):   35.9 ml 26.52 ml/m RA Volume:   13.90 ml 10.27 ml/m LA Vol (A4C):   31.8 ml 23.49 ml/m LA Biplane Vol: 36.6 ml 27.04 ml/m  AORTIC VALVE LVOT Vmax:   130.33 cm/s LVOT Vmean:  87.000 cm/s LVOT VTI:    0.239 m AI PHT:      496 msec  AORTA Ao Root diam: 2.80 cm Ao Asc diam:  2.70 cm TRICUSPID VALVE TR Peak grad:   23.4 mmHg TR Vmax:        242.00 cm/s  SHUNTS Systemic VTI:  0.24 m Systemic Diam: 2.10 cm Olga Millers MD Electronically signed by Olga Millers MD Signature Date/Time: 08/11/2020/3:41:01 PM    Final    DG Hip Unilat With Pelvis 2-3 Views Left  Result Date: 08/09/2020 CLINICAL DATA:  Left knee pain after falling in the bathroom this morning. EXAM: DG HIP (WITH OR WITHOUT PELVIS) 2-3V LEFT COMPARISON:  Intraoperative left hip fluoroscopy images dated 06/30/2018. FINDINGS: The patient is status post intramedullary nail and femoral neck screw bilaterally. No evidence of hardware failure.  There is no evidence of hip fracture or dislocation. Degenerative changes are seen in the spine. IMPRESSION: No acute osseous injury. Electronically Signed   By: Romona Curls M.D.   On: 08/09/2020 12:08   DG FEMUR MIN 2 VIEWS LEFT  Result Date: 08/13/2020 CLINICAL DATA:  Surgery EXAM: LEFT FEMUR 2 VIEWS; DG C-ARM 1-60 MIN COMPARISON:  Plain films 08/09/2020 FINDINGS: Plate and screw fixation device noted across the distal left femoral fracture. Anatomic alignment. No hardware complicating feature. IMPRESSION: Internal fixation across the distal femoral fracture. No visible complicating feature. Electronically Signed   By: Charlett Nose M.D.   On: 08/13/2020 17:21   DG FEMUR PORT MIN 2 VIEWS LEFT  Result Date: 08/13/2020 CLINICAL DATA:  Status post ORIF distal femur fracture EXAM: LEFT FEMUR PORTABLE 2 VIEWS COMPARISON:  CT left knee dated 08/09/2020 FINDINGS: Status post lateral compression plate and screw fixation of the lateral femoral condyle. Fracture fragment is in anatomic alignment and position. Associated mild soft tissue swelling with overlying skin staples. Prior ORIF of a proximal femur fracture, without evidence of complication. IMPRESSION: Status post ORIF of the lateral femoral condyle, as above. Electronically Signed   By: Charline Bills M.D.   On: 08/13/2020 20:41   (Echo, Carotid, EGD, Colonoscopy, ERCP)    Subjective: Patient seen and examined.  No overnight events.  Some pain issues  remains persistent but managed with oral pain medications.  Agreeable to transfer to skilled rehab.   Discharge Exam: Vitals:   08/15/20 2252 08/16/20 0500  BP: 126/73 136/64  Pulse: 99 90  Resp: 16 14  Temp: 98.3 F (36.8 C) 98.5 F (36.9 C)  SpO2: 93% 94%   Vitals:   08/15/20 1318 08/15/20 1348 08/15/20 2252 08/16/20 0500  BP: 100/64 110/69 126/73 136/64  Pulse: 85 (!) 54 99 90  Resp: 20 14 16 14   Temp: 98.4 F (36.9 C) 98.1 F (36.7 C) 98.3 F (36.8 C) 98.5 F (36.9 C)   TempSrc:  Oral Oral Oral  SpO2: 96%  93% 94%  Weight:      Height:        General: Pt is alert, awake, not in acute distress Frail and thinly built.  Appropriate for her age.  On room air.  Not in any distress. Cardiovascular: RRR, S1/S2 +, no rubs, no gallops Respiratory: CTA bilaterally, no wheezing, no rhonchi Abdominal: Soft, NT, ND, bowel sounds + Extremities: Left leg is on knee immobilizer, hinged brace.  Trace edema of the dorsum of the foot.    The results of significant diagnostics from this hospitalization (including imaging, microbiology, ancillary and laboratory) are listed below for reference.     Microbiology: Recent Results (from the past 240 hour(s))  SARS CORONAVIRUS 2 (TAT 6-24 HRS) Nasopharyngeal Nasopharyngeal Swab     Status: None   Collection Time: 08/09/20 11:10 AM   Specimen: Nasopharyngeal Swab  Result Value Ref Range Status   SARS Coronavirus 2 NEGATIVE NEGATIVE Final    Comment: (NOTE) SARS-CoV-2 target nucleic acids are NOT DETECTED.  The SARS-CoV-2 RNA is generally detectable in upper and lower respiratory specimens during the acute phase of infection. Negative results do not preclude SARS-CoV-2 infection, do not rule out co-infections with other pathogens, and should not be used as the sole basis for treatment or other patient management decisions. Negative results must be combined with clinical observations, patient history, and epidemiological information. The expected result is Negative.  Fact Sheet for Patients: 10/09/20  Fact Sheet for Healthcare Providers: HairSlick.no  This test is not yet approved or cleared by the [place] FDA and  has been authorized for detection and/or diagnosis of SARS-CoV-2 by FDA under an Emergency Use Authorization (EUA). This EUA will remain  in effect (meaning this test can be used) for the duration of the COVID-19 declaration under Se  ction 564(b)(1) of the Act, 21 U.S.C. section 360bbb-3(b)(1), unless the authorization is terminated or revoked sooner.  Performed at [facility], [address] [place], [place]   Surgical pcr screen     Status: None   Collection Time: 08/12/20 10:23 AM   Specimen: Nasal Mucosa; Nasal Swab  Result Value Ref Range Status   MRSA, PCR NEGATIVE NEGATIVE Final   Staphylococcus aureus NEGATIVE NEGATIVE Final    Comment: (NOTE) The Xpert SA Assay (FDA approved for NASAL specimens in patients 51 years of age and older), is one component of a comprehensive surveillance program. It is not intended to diagnose infection nor to guide or monitor treatment. Performed at [facility], [address], [place], [place]   SARS CORONAVIRUS 2 (TAT 6-24 HRS) Nasopharyngeal Nasopharyngeal Swab     Status: None   Collection Time: 08/15/20  2:50 AM   Specimen: Nasopharyngeal Swab  Result Value Ref Range Status   SARS Coronavirus 2 NEGATIVE NEGATIVE Final  Comment: (NOTE) SARS-CoV-2 target nucleic acids are NOT DETECTED.  The SARS-CoV-2 RNA is generally detectable in upper and lower respiratory specimens during the acute phase of infection. Negative results do not preclude SARS-CoV-2 infection, do not rule out co-infections with other pathogens, and should not be used as the sole basis for treatment or other patient management decisions. Negative results must be combined with clinical observations, patient history, and epidemiological information. The expected result is Negative.  Fact Sheet for Patients: HairSlick.no  Fact Sheet for Healthcare Providers: quierodirigir.com  This test is not yet approved or cleared by the [place] FDA and  has been authorized for detection and/or diagnosis of SARS-CoV-2 by FDA under an Emergency Use Authorization (EUA). This EUA will remain  in effect  (meaning this test can be used) for the duration of the COVID-19 declaration under Se ction 564(b)(1) of the Act, 21 U.S.C. section 360bbb-3(b)(1), unless the authorization is terminated or revoked sooner.  Performed at [facility], [address] [place], [place] [address]      Labs: BNP (last 3 results) No results for input(s): BNP in the last 8760 hours. Basic Metabolic Panel: Recent Labs  Lab 08/09/20 1110 08/10/20 0222 08/13/20 1948 08/14/20 0315 08/15/20 0320  NA 140 134*  --  139 134*  K 3.7 4.1  --  4.4 3.7  CL 100 100  --  104 102  CO2 29 28  --  26 27  GLUCOSE 171* 177*  --  143* 97  BUN 25* 32*  --  14 19  CREATININE 0.64 0.91 0.51 0.44 0.41*  CALCIUM 9.4 8.6*  --  8.0* 8.1*   Liver Function Tests: No results for input(s): AST, ALT, ALKPHOS, BILITOT, PROT, ALBUMIN in the last 168 hours. No results for input(s): LIPASE, AMYLASE in the last 168 hours. No results for input(s): AMMONIA in the last 168 hours. CBC: Recent Labs  Lab 08/09/20 1110 08/10/20 0222 08/12/20 0301 08/13/20 0314 08/13/20 1948 08/14/20 0315 08/15/20 0320 08/16/20 0303  WBC 14.1*   < > 6.6 6.2 9.0 7.9 6.8 6.7  NEUTROABS 12.9*  --  4.8 4.2  --   --   --   --   HGB 13.7   < > 8.7* 8.9* 12.4 12.1 10.3* 10.0*  HCT 42.0   < > 27.8* 28.1* 38.2 36.4 31.3* 30.7*  MCV 94.4   < > 99.3 96.6 91.8 90.8 92.9 92.2  PLT 172   < > 103* 143* 139* 145* 155 174   < > = values in this interval not displayed.   Cardiac Enzymes: No results for input(s): CKTOTAL, CKMB, CKMBINDEX, TROPONINI in the last 168 hours. BNP: Invalid input(s): POCBNP CBG: No results for input(s): GLUCAP in the last 168 hours. D-Dimer No results for input(s): DDIMER in the last 72 hours. Hgb A1c No results for input(s): HGBA1C in the last 72 hours. Lipid Profile No results for input(s): CHOL, HDL, LDLCALC, TRIG, CHOLHDL, LDLDIRECT in the last 72 hours. Thyroid function studies No results for input(s): TSH, T4TOTAL,  T3FREE, THYROIDAB in the last 72 hours.  Invalid input(s): FREET3 Anemia work up No results for input(s): VITAMINB12, FOLATE, FERRITIN, TIBC, IRON, RETICCTPCT in the last 72 hours. Urinalysis    Component Value Date/Time   COLORURINE YELLOW 08/11/2020 1129   APPEARANCEUR CLEAR 08/11/2020 1129   LABSPEC 1.020 08/11/2020 1129   PHURINE 5.0 08/11/2020 1129   GLUCOSEU 50 (A) 08/11/2020 1129   HGBUR NEGATIVE 08/11/2020 1129   BILIRUBINUR  NEGATIVE 08/11/2020 1129   KETONESUR NEGATIVE 08/11/2020 1129   PROTEINUR NEGATIVE 08/11/2020 1129   NITRITE NEGATIVE 08/11/2020 1129   LEUKOCYTESUR NEGATIVE 08/11/2020 1129   Sepsis Labs Invalid input(s): PROCALCITONIN,  WBC,  LACTICIDVEN Microbiology Recent Results (from the past 240 hour(s))  SARS CORONAVIRUS 2 (TAT 6-24 HRS) Nasopharyngeal Nasopharyngeal Swab     Status: None   Collection Time: 08/09/20 11:10 AM   Specimen: Nasopharyngeal Swab  Result Value Ref Range Status   SARS Coronavirus 2 NEGATIVE NEGATIVE Final    Comment: (NOTE) SARS-CoV-2 target nucleic acids are NOT DETECTED.  The SARS-CoV-2 RNA is generally detectable in upper and lower respiratory specimens during the acute phase of infection. Negative results do not preclude SARS-CoV-2 infection, do not rule out co-infections with other pathogens, and should not be used as the sole basis for treatment or other patient management decisions. Negative results must be combined with clinical observations, patient history, and epidemiological information. The expected result is Negative.  Fact Sheet for Patients: HairSlick.no  Fact Sheet for Healthcare Providers: quierodirigir.com  This test is not yet approved or cleared by the [place] FDA and  has been authorized for detection and/or diagnosis of SARS-CoV-2 by FDA under an Emergency Use Authorization (EUA). This EUA will remain  in effect (meaning this test can be  used) for the duration of the COVID-19 declaration under Se ction 564(b)(1) of the Act, 21 U.S.C. section 360bbb-3(b)(1), unless the authorization is terminated or revoked sooner.  Performed at [facility], [address], [place], [place] [address]   Surgical pcr screen     Status: None   Collection Time: 08/12/20 10:23 AM   Specimen: Nasal Mucosa; Nasal Swab  Result Value Ref Range Status   MRSA, PCR NEGATIVE NEGATIVE Final   Staphylococcus aureus NEGATIVE NEGATIVE Final    Comment: (NOTE) The Xpert SA Assay (FDA approved for NASAL specimens in patients 11 years of age and older), is one component of a comprehensive surveillance program. It is not intended to diagnose infection nor to guide or monitor treatment. Performed at [facility], [address] [place], [place] [address]   SARS CORONAVIRUS 2 (TAT 6-24 HRS) Nasopharyngeal Nasopharyngeal Swab     Status: None   Collection Time: 08/15/20  2:50 AM   Specimen: Nasopharyngeal Swab  Result Value Ref Range Status   SARS Coronavirus 2 NEGATIVE NEGATIVE Final    Comment: (NOTE) SARS-CoV-2 target nucleic acids are NOT DETECTED.  The SARS-CoV-2 RNA is generally detectable in upper and lower respiratory specimens during the acute phase of infection. Negative results do not preclude SARS-CoV-2 infection, do not rule out co-infections with other pathogens, and should not be used as the sole basis for treatment or other patient management decisions. Negative results must be combined with clinical observations, patient history, and epidemiological information. The expected result is Negative.  Fact Sheet for Patients: HairSlick.no  Fact Sheet for Healthcare Providers: quierodirigir.com  This test is not yet approved or cleared by the [place] FDA and  has been authorized for detection and/or diagnosis of SARS-CoV-2 by FDA under an Emergency  Use Authorization (EUA). This EUA will remain  in effect (meaning this test can be used) for the duration of the COVID-19 declaration under Se ction 564(b)(1) of the Act, 21 U.S.C. section 360bbb-3(b)(1), unless the authorization is terminated or revoked sooner.  Performed at [facility], [address] [place], [place] [address]      Time coordinating  discharge: 32 minutes  SIGNED:   Dorcas Carrow, MD  [facility] 08/16/2020, 9:49 AM

## 2020-08-16 NOTE — Plan of Care (Signed)
  Problem: Clinical Measurements: Goal: Respiratory complications will improve Outcome: Progressing   Problem: Clinical Measurements: Goal: Cardiovascular complication will be avoided Outcome: Progressing   Problem: Clinical Measurements: Goal: Will remain free from infection Outcome: Progressing   Problem: Pain Managment: Goal: General experience of comfort will improve Outcome: Progressing   

## 2020-08-16 NOTE — Progress Notes (Signed)
Report called to [facility] LPN at [facility]. No needs to communicate by SNF personnel at time of d/c. Pt remains stable. EMS has been called to transport. Rn will continue to monitor until transport arrives.

## 2020-08-17 NOTE — Progress Notes (Signed)
Pt stable at time of EMS transport. No needs at time of transport. Rn will continue to monitor.

## 2020-08-17 NOTE — TOC Progression Note (Signed)
Transition of Care [facility]) - Progression Note    Patient Details  Name: Christine Jennings MRN: [id number] Date of Birth: 09-Nov-1927  Transition of Care [facility]) CM/SW Contact  Amada Jupiter, LCSW Phone Number: 08/17/2020, 10:56 AM  Clinical Narrative:    Pt's planned dc held yesterday due to late pick up and facility unable to admit. Have spoken with [facility] this morning and they are ready to accept today. [facility] called at 10:55.  Pt and community case manager, Christine Jennings updated.   Expected Discharge Plan: Skilled Nursing Facility Barriers to Discharge: Continued Medical Work up  Expected Discharge Plan and Services Expected Discharge Plan: Skilled Nursing Facility       Living arrangements for the past 2 months: Single Family Home Expected Discharge Date: 08/17/20               DME Arranged: N/A DME Agency: NA                   Social Determinants of Health (SDOH) Interventions    Readmission Risk Interventions No flowsheet data found.

## 2020-08-17 NOTE — Plan of Care (Signed)
  Problem: Clinical Measurements: Goal: Respiratory complications will improve Outcome: Progressing   Problem: Clinical Measurements: Goal: Cardiovascular complication will be avoided Outcome: Progressing   Problem: Elimination: Goal: Will not experience complications related to bowel motility Outcome: Progressing   Problem: Elimination: Goal: Will not experience complications related to urinary retention Outcome: Progressing   

## 2020-08-17 NOTE — Plan of Care (Signed)
Pt stable at this time. Pt to d/c to SNF when EMS arrives. Report called to [facility] LPN at [facility]. NO needs at this time or changes in pt condition. Rn will continue to monitor.

## 2020-08-20 ENCOUNTER — Encounter (HOSPITAL_COMMUNITY): Payer: Self-pay | Admitting: Orthopedic Surgery

## 2021-05-20 ENCOUNTER — Emergency Department (HOSPITAL_COMMUNITY): Payer: Medicare Other

## 2021-05-20 ENCOUNTER — Inpatient Hospital Stay (HOSPITAL_COMMUNITY)
Admission: EM | Admit: 2021-05-20 | Discharge: 2021-05-24 | DRG: 871 | Disposition: A | Discharge status: Nursing Home (Includes ICF) | Payer: Medicare Other | Source: Skilled Nursing Facility | Attending: Internal Medicine | Admitting: Internal Medicine

## 2021-05-20 ENCOUNTER — Other Ambulatory Visit: Payer: Self-pay

## 2021-05-20 DIAGNOSIS — M810 Age-related osteoporosis without current pathological fracture: Secondary | ICD-10-CM | POA: Diagnosis present

## 2021-05-20 DIAGNOSIS — Z7983 Long term (current) use of bisphosphonates: Secondary | ICD-10-CM

## 2021-05-20 DIAGNOSIS — D72829 Elevated white blood cell count, unspecified: Secondary | ICD-10-CM | POA: Diagnosis not present

## 2021-05-20 DIAGNOSIS — E538 Deficiency of other specified B group vitamins: Secondary | ICD-10-CM | POA: Diagnosis present

## 2021-05-20 DIAGNOSIS — G9341 Metabolic encephalopathy: Secondary | ICD-10-CM | POA: Diagnosis present

## 2021-05-20 DIAGNOSIS — Z7982 Long term (current) use of aspirin: Secondary | ICD-10-CM

## 2021-05-20 DIAGNOSIS — E871 Hypo-osmolality and hyponatremia: Secondary | ICD-10-CM | POA: Diagnosis not present

## 2021-05-20 DIAGNOSIS — R410 Disorientation, unspecified: Secondary | ICD-10-CM | POA: Diagnosis present

## 2021-05-20 DIAGNOSIS — G9340 Encephalopathy, unspecified: Secondary | ICD-10-CM | POA: Diagnosis not present

## 2021-05-20 DIAGNOSIS — E872 Acidosis, unspecified: Secondary | ICD-10-CM

## 2021-05-20 DIAGNOSIS — A419 Sepsis, unspecified organism: Secondary | ICD-10-CM | POA: Diagnosis not present

## 2021-05-20 DIAGNOSIS — B9620 Unspecified Escherichia coli [E. coli] as the cause of diseases classified elsewhere: Secondary | ICD-10-CM | POA: Diagnosis present

## 2021-05-20 DIAGNOSIS — Z20822 Contact with and (suspected) exposure to covid-19: Secondary | ICD-10-CM | POA: Diagnosis present

## 2021-05-20 DIAGNOSIS — A4151 Sepsis due to Escherichia coli [E. coli]: Secondary | ICD-10-CM | POA: Diagnosis present

## 2021-05-20 DIAGNOSIS — D638 Anemia in other chronic diseases classified elsewhere: Secondary | ICD-10-CM | POA: Diagnosis present

## 2021-05-20 DIAGNOSIS — R6520 Severe sepsis without septic shock: Secondary | ICD-10-CM | POA: Diagnosis present

## 2021-05-20 DIAGNOSIS — Z79899 Other long term (current) drug therapy: Secondary | ICD-10-CM | POA: Diagnosis not present

## 2021-05-20 DIAGNOSIS — N390 Urinary tract infection, site not specified: Secondary | ICD-10-CM | POA: Diagnosis not present

## 2021-05-20 DIAGNOSIS — J159 Unspecified bacterial pneumonia: Secondary | ICD-10-CM | POA: Diagnosis present

## 2021-05-20 DIAGNOSIS — J189 Pneumonia, unspecified organism: Secondary | ICD-10-CM | POA: Diagnosis not present

## 2021-05-20 DIAGNOSIS — D72819 Decreased white blood cell count, unspecified: Secondary | ICD-10-CM | POA: Diagnosis not present

## 2021-05-20 DIAGNOSIS — N3001 Acute cystitis with hematuria: Secondary | ICD-10-CM | POA: Diagnosis present

## 2021-05-20 LAB — URINALYSIS, ROUTINE W REFLEX MICROSCOPIC
Bilirubin Urine: NEGATIVE
Glucose, UA: NEGATIVE mg/dL
Ketones, ur: NEGATIVE mg/dL
Nitrite: NEGATIVE
Protein, ur: 30 mg/dL — AB
Specific Gravity, Urine: 1.018 (ref 1.005–1.030)
WBC, UA: >50 WBC/hpf — ABNORMAL HIGH (ref 0–5)
pH: 5 (ref 5.0–8.0)

## 2021-05-20 LAB — TROPONIN I (HIGH SENSITIVITY)
Troponin I (High Sensitivity): 16 ng/L (ref <18)
Troponin I (High Sensitivity): 5 ng/L (ref <18)

## 2021-05-20 LAB — CBC WITH DIFFERENTIAL/PLATELET
Abs Immature Granulocytes: 0.05 10*3/uL (ref 0.00–0.07)
Basophils Absolute: 0 10*3/uL (ref 0.0–0.1)
Basophils Relative: 0 %
Eosinophils Absolute: 0 10*3/uL (ref 0.0–0.5)
Eosinophils Relative: 0 %
HCT: 38.2 % (ref 36.0–46.0)
Hemoglobin: 12.7 g/dL (ref 12.0–15.0)
Immature Granulocytes: 0 %
Lymphocytes Relative: 6 %
Lymphs Abs: 0.7 10*3/uL (ref 0.7–4.0)
MCH: 30.8 pg (ref 26.0–34.0)
MCHC: 33.2 g/dL (ref 30.0–36.0)
MCV: 92.7 fL (ref 80.0–100.0)
Monocytes Absolute: 0.7 10*3/uL (ref 0.1–1.0)
Monocytes Relative: 6 %
Neutro Abs: 9.9 10*3/uL — ABNORMAL HIGH (ref 1.7–7.7)
Neutrophils Relative %: 88 %
Platelets: 182 10*3/uL (ref 150–400)
RBC: 4.12 MIL/uL (ref 3.87–5.11)
RDW: 13.5 % (ref 11.5–15.5)
WBC: 11.3 10*3/uL — ABNORMAL HIGH (ref 4.0–10.5)
nRBC: 0 % (ref 0.0–0.2)

## 2021-05-20 LAB — COMPREHENSIVE METABOLIC PANEL
ALT: 22 U/L (ref 0–44)
AST: 36 U/L (ref 15–41)
Albumin: 3.4 g/dL — ABNORMAL LOW (ref 3.5–5.0)
Alkaline Phosphatase: 61 U/L (ref 38–126)
Anion gap: 9 (ref 5–15)
BUN: 28 mg/dL — ABNORMAL HIGH (ref 8–23)
CO2: 24 mmol/L (ref 22–32)
Calcium: 8.7 mg/dL — ABNORMAL LOW (ref 8.9–10.3)
Chloride: 110 mmol/L (ref 98–111)
Creatinine, Ser: 0.77 mg/dL (ref 0.44–1.00)
GFR, Estimated: >60 mL/min (ref >60)
Glucose, Bld: 153 mg/dL — ABNORMAL HIGH (ref 70–99)
Potassium: 4 mmol/L (ref 3.5–5.1)
Sodium: 143 mmol/L (ref 135–145)
Total Bilirubin: 0.8 mg/dL (ref 0.3–1.2)
Total Protein: 7 g/dL (ref 6.5–8.1)

## 2021-05-20 LAB — BLOOD GAS, VENOUS
Acid-Base Excess: 0.7 mmol/L (ref 0.0–2.0)
Bicarbonate: 24.3 mmol/L (ref 20.0–28.0)
O2 Saturation: 91.8 %
Patient temperature: 37
pCO2, Ven: 35 mmHg — ABNORMAL LOW (ref 44–60)
pH, Ven: 7.45 — ABNORMAL HIGH (ref 7.25–7.43)
pO2, Ven: 56 mmHg — ABNORMAL HIGH (ref 32–45)

## 2021-05-20 LAB — LACTIC ACID, PLASMA
Lactic Acid, Venous: 3.8 mmol/L (ref 0.5–1.9)
Lactic Acid, Venous: 3.7 mmol/L (ref 0.5–1.9)

## 2021-05-20 LAB — RESP PANEL BY RT-PCR (FLU A&B, COVID) ARPGX2
Influenza A by PCR: NEGATIVE
Influenza B by PCR: NEGATIVE
SARS Coronavirus 2 by RT PCR: NEGATIVE

## 2021-05-20 MED ORDER — SODIUM CHLORIDE 0.9 % IV SOLN
500.0000 mg | Freq: Once | INTRAVENOUS | Status: AC
  Administered 2021-05-20: 500 mg via INTRAVENOUS
  Filled 2021-05-20: qty 5

## 2021-05-20 MED ORDER — LACTATED RINGERS IV BOLUS (SEPSIS)
1000.0000 mL | Freq: Once | INTRAVENOUS | Status: AC
  Administered 2021-05-20: 1000 mL via INTRAVENOUS

## 2021-05-20 MED ORDER — LACTATED RINGERS IV SOLN: INTRAVENOUS | Status: DC

## 2021-05-20 MED ORDER — ACETAMINOPHEN 325 MG PO TABS
650.0000 mg | ORAL_TABLET | Freq: Once | ORAL | Status: AC
Start: 2021-05-20 — End: 2021-05-20
  Administered 2021-05-20: 650 mg via ORAL
  Filled 2021-05-20: qty 2

## 2021-05-20 MED ORDER — SODIUM CHLORIDE 0.9 % IV SOLN
2.0000 g | INTRAVENOUS | Status: DC
  Administered 2021-05-20 – 2021-05-23 (×4): 2 g via INTRAVENOUS
  Filled 2021-05-20 (×4): qty 20

## 2021-05-20 MED ORDER — LACTATED RINGERS IV BOLUS
1000.0000 mL | Freq: Once | INTRAVENOUS | Status: AC
Start: 2021-05-20 — End: 2021-05-21
  Administered 2021-05-20: 1000 mL via INTRAVENOUS

## 2021-05-20 NOTE — ED Triage Notes (Signed)
Sent in by nursing facility for oral temp of 103. Possible UTI. AMS per facility. Patient is alert and knows her name. Does not know why she is here. JRPRN ?

## 2021-05-20 NOTE — ED Provider Notes (Signed)
[facility]-EMERGENCY DEPT Provider Note   CSN: [id number] Arrival date & time: 05/20/21  1954     History  Chief Complaint  Patient presents with   Fever    Oral temp of 103 at nursing facility per EMS    Christine Jennings is a 88 y.o. female.   Patient as above with significant medical history as below, including osteoporosis, resides at care facility who presents to the ED with complaint of fever, altered.  Patient has no acute complaints this time, she is not sure why she is here.  Denies dysuria, hematuria, abnormal vaginal bleeding or discharge.  No belly pain, no nausea vomiting.  No chest pain or dyspnea.  Patient is a very poor historian, she herself has no complaints     Past Medical History: No date: Osteoporosis  Past Surgical History: No date: ABDOMINAL SURGERY     Comment:  Cyst Removal  06/25/2017: INTRAMEDULLARY (IM) NAIL INTERTROCHANTERIC; Right     Comment:  Procedure: INTRAMEDULLARY (IM) NAIL INTERTROCHANTRIC;                Surgeon: Samson Frederic, MD;  Location: [facility] ORS;                Service: Orthopedics;  Laterality: Right; 06/30/2018: INTRAMEDULLARY (IM) NAIL INTERTROCHANTERIC; Left     Comment:  Procedure: INTRAMEDULLARY (IM) NAIL INTERTROCHANTRIC;                Surgeon: Samson Frederic, MD;  Location: [facility] OR;  Service:              Orthopedics;  Laterality: Left; No date: Left Breast Surgery; Left 08/13/2020: ORIF FEMUR FRACTURE; Left     Comment:  Procedure: OPEN REDUCTION INTERNAL FIXATION (ORIF)               DISTAL FEMUR FRACTURE;  Surgeon: Samson Frederic, MD;                Location: [facility] ORS;  Service: Orthopedics;  Laterality:               Left;    The history is provided by the patient. No language interpreter was used.  Fever Associated symptoms: no chest pain, no chills, no confusion, no cough, no headaches, no nausea, no rash and no vomiting       Home Medications Prior to Admission medications   Medication  Sig Start Date End Date Taking? Authorizing Provider  alendronate (FOSAMAX) 70 MG tablet Take 70 mg by mouth every Monday. Take with a full glass of water on an empty stomach.    [provider]  amoxicillin (AMOXIL) 500 MG capsule Take 1,000 mg by mouth daily as needed (prior to dental work).    [provider]  aspirin EC 81 MG tablet Take 81 mg by mouth daily. Swallow whole.    [provider]  Calcium Carb-Cholecalciferol (CALCIUM 500 +D PO) Take 1 tablet by mouth daily.    [provider]  docusate sodium (COLACE) 100 MG capsule Take 1 capsule (100 mg total) by mouth 2 (two) times daily. 06/28/17   Glade Lloyd, MD  enoxaparin (LOVENOX) 40 MG/0.4ML injection Inject 0.3 mLs (30 mg total) into the skin daily. 08/16/20 09/15/20  Dorcas Carrow, MD  feeding supplement, ENSURE ENLIVE, (ENSURE ENLIVE) LIQD Take 237 mLs by mouth 2 (two) times daily between meals. 06/28/17   Glade Lloyd, MD  HYDROcodone-acetaminophen (NORCO/VICODIN) 5-325 MG tablet Take 1-2 tablets by mouth every  6 (six) hours as needed for moderate pain. 08/15/20   Barrie Dunker B, PA-C  polyethylene glycol (MIRALAX / GLYCOLAX) packet Take 17 g by mouth daily as needed for mild constipation. 06/28/17   Glade Lloyd, MD      Allergies    Patient has no known allergies.    Review of Systems   Review of Systems  Constitutional:  Positive for fever. Negative for chills.  HENT:  Negative for facial swelling and trouble swallowing.   Eyes:  Negative for photophobia and visual disturbance.  Respiratory:  Negative for cough and shortness of breath.   Cardiovascular:  Negative for chest pain and palpitations.  Gastrointestinal:  Negative for abdominal pain, nausea and vomiting.  Endocrine: Negative for polydipsia and polyuria.  Genitourinary:  Negative for difficulty urinating and hematuria.  Musculoskeletal:  Negative for gait problem and joint swelling.  Skin:  Negative for pallor and rash.   Neurological:  Negative for syncope and headaches.  Psychiatric/Behavioral:  Negative for agitation and confusion.    Physical Exam Updated Vital Signs BP 126/64   Pulse 90   Temp (!) 101 F (38.3 C) (Rectal)   Resp 19   Ht  (1.575 m)   Wt 45.4 kg   SpO2 93%   BMI 18.29 kg/m  Physical Exam Vitals and nursing note reviewed. Exam conducted with a chaperone present.  Constitutional:      General: She is not in acute distress.    Appearance: Normal appearance.     Comments: Frail  HENT:     Head: Normocephalic and atraumatic.     Right Ear: External ear normal.     Left Ear: External ear normal.     Nose: Nose normal.     Mouth/Throat:     Mouth: Mucous membranes are moist.  Eyes:     General: No scleral icterus.       Right eye: No discharge.        Left eye: No discharge.     Extraocular Movements: Extraocular movements intact.     Pupils: Pupils are equal, round, and reactive to light.  Cardiovascular:     Rate and Rhythm: Normal rate and regular rhythm.     Pulses: Normal pulses.     Heart sounds: Normal heart sounds.  Pulmonary:     Effort: Pulmonary effort is normal. No respiratory distress.     Breath sounds: Normal breath sounds.  Abdominal:     General: Abdomen is flat.     Palpations: Abdomen is soft.     Tenderness: There is no abdominal tenderness.  Musculoskeletal:        General: Normal range of motion.     Cervical back: Normal range of motion.     Right lower leg: No edema.     Left lower leg: No edema.  Skin:    General: Skin is warm and dry.     Capillary Refill: Capillary refill takes less than 2 seconds.  Neurological:     Mental Status: She is alert. She is disoriented.     GCS: GCS eye subscore is 4. GCS verbal subscore is 5. GCS motor subscore is 6.     Cranial Nerves: Cranial nerves 2-12 are intact.     Sensory: Sensation is intact.     Motor: Motor function is intact.     Coordination: Coordination is intact.  Psychiatric:         Mood and Affect: Mood normal.  Behavior: Behavior normal.    ED Results / Procedures / Treatments   Labs (all labs ordered are listed, but only abnormal results are displayed) Labs Reviewed  LACTIC ACID, PLASMA - Abnormal; Notable for the following components:      Result Value   Lactic Acid, Venous 3.8 (*)    All other components within normal limits  LACTIC ACID, PLASMA - Abnormal; Notable for the following components:   Lactic Acid, Venous 3.7 (*)    All other components within normal limits  COMPREHENSIVE METABOLIC PANEL - Abnormal; Notable for the following components:   Glucose, Bld 153 (*)    BUN 28 (*)    Calcium 8.7 (*)    Albumin 3.4 (*)    All other components within normal limits  CBC WITH DIFFERENTIAL/PLATELET - Abnormal; Notable for the following components:   WBC 11.3 (*)    Neutro Abs 9.9 (*)    All other components within normal limits  URINALYSIS, ROUTINE W REFLEX MICROSCOPIC - Abnormal; Notable for the following components:   APPearance CLOUDY (*)    Hgb urine dipstick MODERATE (*)    Protein, ur 30 (*)    Leukocytes,Ua LARGE (*)    WBC, UA >50 (*)    Bacteria, UA MANY (*)    All other components within normal limits  BLOOD GAS, VENOUS - Abnormal; Notable for the following components:   pH, Ven 7.45 (*)    pCO2, Ven 35 (*)    pO2, Ven 56 (*)    All other components within normal limits  RESP PANEL BY RT-PCR (FLU A&B, COVID) ARPGX2  CULTURE, BLOOD (ROUTINE X 2)  CULTURE, BLOOD (ROUTINE X 2)  URINE CULTURE  PROTIME-INR  APTT  LEGIONELLA PNEUMOPHILA SEROGP 1 UR AG  TROPONIN I (HIGH SENSITIVITY)  TROPONIN I (HIGH SENSITIVITY)    EKG None  Radiology CT Head Wo Contrast  Result Date: 05/20/2021 CLINICAL DATA:  Mental status change. EXAM: CT HEAD WITHOUT CONTRAST TECHNIQUE: Contiguous axial images were obtained from the base of the skull through the vertex without intravenous contrast. RADIATION DOSE REDUCTION: This exam was performed  according to the departmental dose-optimization program which includes automated exposure control, adjustment of the mA and/or kV according to patient size and/or use of iterative reconstruction technique. COMPARISON:  Head CT 06/29/2018 FINDINGS: Brain: Stable age related cerebral atrophy, ventriculomegaly and severe periventricular white matter disease. No extra-axial fluid collections are identified. No CT findings for acute hemispheric infarction or intracranial hemorrhage. No mass lesions. The brainstem and cerebellum are normal. Vascular: Stable appearing vascular calcifications but no aneurysm or hyperdense vessels. Skull: No skull fracture or bone lesions. Sinuses/Orbits: There is fluid in the left half of the sphenoid sinus which could suggest acute sphenoid sinusitis. The other paranasal sinuses are clear and the mastoid air cells and middle ear cavities are clear. The globes are intact. Other: No scalp lesions or scalp hematoma. IMPRESSION: 1. Stable age related cerebral atrophy, ventriculomegaly and severe periventricular white matter disease. 2. No acute intracranial findings or mass lesions. 3. Fluid in the left half of the sphenoid sinus could suggest acute sphenoid sinusitis. Electronically Signed   By: Rudie MeyerP.  Gallerani M.D.   On: 05/20/2021 20:54   DG Chest Port 1 View  Result Date: 05/20/2021 CLINICAL DATA:  Questionable sepsis. EXAM: PORTABLE CHEST 1 VIEW COMPARISON:  Chest x-ray 06/29/2018 FINDINGS: There are patchy opacities in scratched at the left lung base. Cardiomediastinal silhouette is within normal limits. There is no evidence for pleural  effusion or pneumothorax. No acute fractures are seen. IMPRESSION: 1. Patchy opacities in the left lung base may represent atelectasis and or infection. Electronically Signed   By: Darliss Cheney M.D.   On: 05/20/2021 20:46    Procedures .Critical Care Performed by: Sloan Leiter, DO Authorized by: Sloan Leiter, DO   Critical care provider  statement:    Critical care time (minutes):  75   Critical care time was exclusive of:  Separately billable procedures and treating other patients   Critical care was necessary to treat or prevent imminent or life-threatening deterioration of the following conditions:  Sepsis   Critical care was time spent personally by me on the following activities:  Development of treatment plan with patient or surrogate, discussions with consultants, evaluation of patient's response to treatment, examination of patient, ordering and review of laboratory studies, ordering and review of radiographic studies, ordering and performing treatments and interventions, pulse oximetry, re-evaluation of patient's condition, review of old charts and obtaining history from patient or surrogate   Care discussed with: admitting provider      Medications Ordered in ED Medications  lactated ringers infusion ( Intravenous New Bag/Given 05/20/21 2102)  cefTRIAXone (ROCEPHIN) 2 g in sodium chloride 0.9 % 100 mL IVPB (2 g Intravenous New Bag/Given 05/20/21 2103)  lactated ringers bolus 1,000 mL (1,000 mLs Intravenous New Bag/Given 05/20/21 2103)  azithromycin (ZITHROMAX) 500 mg in sodium chloride 0.9 % 250 mL IVPB (500 mg Intravenous New Bag/Given 05/20/21 2152)  acetaminophen (TYLENOL) tablet 650 mg (650 mg Oral Given 05/20/21 2321)  lactated ringers bolus 1,000 mL (1,000 mLs Intravenous New Bag/Given 05/20/21 2301)    ED Course/ Medical Decision Making/ A&P                           Medical Decision Making Amount and/or Complexity of Data Reviewed Labs: ordered. Radiology: ordered. ECG/medicine tests: ordered.  Risk OTC drugs. Prescription drug management. Decision regarding hospitalization.    CC: ams, fever   This patient presents to the Emergency Department for the above complaint. This involves an extensive number of treatment options and is a complaint that carries with it a high risk of complications and  morbidity. Vital signs were reviewed. Serious etiologies considered.  Ddx includes but not limited to sepsis, uti, cva, pna, acs, dehydration, metabolic, endocrine, dehydration, other acute etiologies considered   Record review:  Previous records obtained and reviewed  Recent ED notes, recent admission, prior labs and imaging  Additional history obtained from N/A  Medical and surgical history as noted above.   Work up as above, notable for:  Labs & imaging results that were available during my care of the patient were visualized by me and considered in my medical decision making.   I ordered imaging studies which included CT head, chest x-ray and I visualized the imaging and I agree with radiologist interpretation.  Pneumonia  Legionella pending  Cardiac monitoring reviewed and interpreted personally which shows NSR  Management: Patient meets criteria for sepsis, code sepsis was paged.  Start Rocephin azithromycin for presumed UTI with pneumonia.  Blood cultures sent prior to initiation of antibiotics.  Initial lactate is 3.8.  IV fluids in process repeat lactate is 3.7.  Continue fluids.  Reassessment:  Patient is feeling better after fluids, APAP.  Heart rate improved, lactic acid is downtrending.  On repeat neurologic assessment patient still remains altered, she has no focal deficits on exam. Delirium is  likely secondary to sepsis  Recommend admission.  She is agreeable.  D/w Dr. Antionette Char accepts patient for admission.                 Social determinants of health include -  Social History   Socioeconomic History   Marital status: Single    Spouse name: Not on file   Number of children: Not on file   Years of education: Not on file   Highest education level: Not on file  Occupational History   Not on file  Tobacco Use   Smoking status: Never   Smokeless tobacco: Never  Vaping Use   Vaping Use: Not on file  Substance and Sexual Activity   Alcohol use:  Never   Drug use: Never   Sexual activity: Not Currently  Other Topics Concern   Not on file  Social History Narrative   Not on file   Social Determinants of Health   Financial Resource Strain: Not on file  Food Insecurity: Not on file  Transportation Needs: Not on file  Physical Activity: Not on file  Stress: Not on file  Social Connections: Not on file  Intimate Partner Violence: Not on file      This chart was dictated using voice recognition software.  Despite best efforts to proofread,  errors can occur which can change the documentation meaning.         Final Clinical Impression(s) / ED Diagnoses Final diagnoses:  Delirium  Acute cystitis with hematuria  Community acquired pneumonia, unspecified laterality  Sepsis, due to unspecified organism, unspecified whether acute organ dysfunction present [facility])    Rx / DC Orders ED Discharge Orders     None         Sloan Leiter, DO 05/21/21 0008

## 2021-05-20 NOTE — Sepsis Progress Note (Signed)
Following per sepsis protocol   

## 2021-05-21 ENCOUNTER — Encounter (HOSPITAL_COMMUNITY): Payer: Self-pay | Admitting: Family Medicine

## 2021-05-21 DIAGNOSIS — D72829 Elevated white blood cell count, unspecified: Secondary | ICD-10-CM

## 2021-05-21 DIAGNOSIS — A419 Sepsis, unspecified organism: Secondary | ICD-10-CM | POA: Diagnosis not present

## 2021-05-21 DIAGNOSIS — J189 Pneumonia, unspecified organism: Secondary | ICD-10-CM

## 2021-05-21 DIAGNOSIS — D72819 Decreased white blood cell count, unspecified: Secondary | ICD-10-CM | POA: Diagnosis not present

## 2021-05-21 DIAGNOSIS — E872 Acidosis, unspecified: Secondary | ICD-10-CM

## 2021-05-21 DIAGNOSIS — G9340 Encephalopathy, unspecified: Secondary | ICD-10-CM | POA: Diagnosis not present

## 2021-05-21 LAB — CBC
HCT: 34.3 % — ABNORMAL LOW (ref 36.0–46.0)
Hemoglobin: 11.4 g/dL — ABNORMAL LOW (ref 12.0–15.0)
MCH: 31.3 pg (ref 26.0–34.0)
MCHC: 33.2 g/dL (ref 30.0–36.0)
MCV: 94.2 fL (ref 80.0–100.0)
Platelets: 151 10*3/uL (ref 150–400)
RBC: 3.64 MIL/uL — ABNORMAL LOW (ref 3.87–5.11)
RDW: 13.9 % (ref 11.5–15.5)
WBC: 7.6 10*3/uL (ref 4.0–10.5)
nRBC: 0 % (ref 0.0–0.2)

## 2021-05-21 LAB — BASIC METABOLIC PANEL
Anion gap: 5 (ref 5–15)
BUN: 25 mg/dL — ABNORMAL HIGH (ref 8–23)
CO2: 27 mmol/L (ref 22–32)
Calcium: 8.3 mg/dL — ABNORMAL LOW (ref 8.9–10.3)
Chloride: 109 mmol/L (ref 98–111)
Creatinine, Ser: 0.6 mg/dL (ref 0.44–1.00)
GFR, Estimated: >60 mL/min (ref >60)
Glucose, Bld: 100 mg/dL — ABNORMAL HIGH (ref 70–99)
Potassium: 3.7 mmol/L (ref 3.5–5.1)
Sodium: 141 mmol/L (ref 135–145)

## 2021-05-21 LAB — PROTIME-INR
INR: 1.2 (ref 0.8–1.2)
Prothrombin Time: 14.6 seconds (ref 11.4–15.2)

## 2021-05-21 LAB — LACTIC ACID, PLASMA
Lactic Acid, Venous: 1.9 mmol/L (ref 0.5–1.9)
Lactic Acid, Venous: 1.3 mmol/L (ref 0.5–1.9)

## 2021-05-21 LAB — APTT: aPTT: 33 seconds (ref 24–36)

## 2021-05-21 MED ORDER — ONDANSETRON HCL 4 MG PO TABS
4.0000 mg | ORAL_TABLET | Freq: Four times a day (QID) | ORAL | Status: DC | PRN
Start: 2021-05-21 — End: 2021-05-24

## 2021-05-21 MED ORDER — ONDANSETRON HCL 4 MG/2ML IJ SOLN: 4.0000 mg | Freq: Four times a day (QID) | INTRAMUSCULAR | Status: DC | PRN

## 2021-05-21 MED ORDER — ENOXAPARIN SODIUM 30 MG/0.3ML IJ SOSY
30.0000 mg | PREFILLED_SYRINGE | INTRAMUSCULAR | Status: DC
  Administered 2021-05-21 – 2021-05-22 (×2): 30 mg via SUBCUTANEOUS
  Filled 2021-05-21 (×2): qty 0.3

## 2021-05-21 MED ORDER — SODIUM CHLORIDE 0.9% FLUSH
3.0000 mL | Freq: Two times a day (BID) | INTRAVENOUS | Status: DC
  Administered 2021-05-21 – 2021-05-24 (×4): 3 mL via INTRAVENOUS

## 2021-05-21 MED ORDER — ACETAMINOPHEN 650 MG RE SUPP: 650.0000 mg | Freq: Four times a day (QID) | RECTAL | Status: DC | PRN

## 2021-05-21 MED ORDER — ACETAMINOPHEN 325 MG PO TABS: 650.0000 mg | ORAL_TABLET | Freq: Four times a day (QID) | ORAL | Status: DC | PRN

## 2021-05-21 MED ORDER — FOLIC ACID 1 MG PO TABS
1.0000 mg | ORAL_TABLET | Freq: Every day | ORAL | Status: DC
  Administered 2021-05-21 – 2021-05-24 (×4): 1 mg via ORAL
  Filled 2021-05-21 (×4): qty 1

## 2021-05-21 MED ORDER — LACTATED RINGERS IV SOLN: INTRAVENOUS | Status: AC

## 2021-05-21 NOTE — H&P (Signed)
History and Physical    Christine Jennings ZOX:[id number] DOB: 31-May-1927 DOA: 05/20/2021  PCP: Dois Davenport, MD   Patient coming from: SNF   Chief Complaint: Fever and confusion   HPI: Christine Jennings is a pleasant 88 y.o. female with medical history significant for osteoporosis who now presents to the emergency department with fevers and confusion.  Patient was noted to have a temperature of 103 F and confusion at her acute rehab facility and was directed to the ED for further evaluation of this.  Patient herself has no complaints, and is pleasant and cooperative, but oriented to person only.  ED Course: Upon arrival to the ED, patient is found to be febrile to 38.3 C, tachypneic, and slightly tachycardic.  Blood work is most notable for lactic acid of 3.8, elevated BUN to creatinine ratio, and WBC 11.3.  CT head is negative for acute intracranial abnormality.  Chest x-ray notable for patchy opacities in the left base which could reflect atelectasis or infection.  Blood and urine cultures were collected and the patient was given 2 L of LR, Rocephin, and azithromycin in the ED.  Review of Systems:  Unable to complete ROS secondary the patient's clinical condition.  Past Medical History:  Diagnosis Date   Osteoporosis     Past Surgical History:  Procedure Laterality Date   ABDOMINAL SURGERY     Cyst Removal    INTRAMEDULLARY (IM) NAIL INTERTROCHANTERIC Right 06/25/2017   Procedure: INTRAMEDULLARY (IM) NAIL INTERTROCHANTRIC;  Surgeon: Samson Frederic, MD;  Location: [facility];  Service: Orthopedics;  Laterality: Right;   INTRAMEDULLARY (IM) NAIL INTERTROCHANTERIC Left 06/30/2018   Procedure: INTRAMEDULLARY (IM) NAIL INTERTROCHANTRIC;  Surgeon: Samson Frederic, MD;  Location: [facility];  Service: Orthopedics;  Laterality: Left;   Left Breast Surgery Left    ORIF FEMUR FRACTURE Left 08/13/2020   Procedure: OPEN REDUCTION INTERNAL FIXATION (ORIF) DISTAL FEMUR FRACTURE;  Surgeon: Samson Frederic, MD;  Location: [facility];  Service: Orthopedics;  Laterality: Left;    Social History:   reports that she has never smoked. She has never used smokeless tobacco. She reports that she does not drink alcohol and does not use drugs.  No Known Allergies  Family History  Problem Relation Age of Onset   Heart disease Father      Prior to Admission medications   Medication Sig Start Date End Date Taking? Authorizing Provider  alendronate (FOSAMAX) 70 MG tablet Take 70 mg by mouth every Monday. Take with a full glass of water on an empty stomach.    [provider]  amoxicillin (AMOXIL) 500 MG capsule Take 1,000 mg by mouth daily as needed (prior to dental work).    [provider]  aspirin EC 81 MG tablet Take 81 mg by mouth daily. Swallow whole.    [provider]  Calcium Carb-Cholecalciferol (CALCIUM 500 +D PO) Take 1 tablet by mouth daily.    [provider]  docusate sodium (COLACE) 100 MG capsule Take 1 capsule (100 mg total) by mouth 2 (two) times daily. 06/28/17   Glade Lloyd, MD  enoxaparin (LOVENOX) 40 MG/0.4ML injection Inject 0.3 mLs (30 mg total) into the skin daily. 08/16/20 09/15/20  Dorcas Carrow, MD  feeding supplement, ENSURE ENLIVE, (ENSURE ENLIVE) LIQD Take 237 mLs by mouth 2 (two) times daily between meals. 06/28/17   Glade Lloyd, MD  HYDROcodone-acetaminophen (NORCO/VICODIN) 5-325 MG tablet Take 1-2 tablets by mouth every 6 (six) hours as needed for moderate pain. 08/15/20  Barrie DunkerMcCauley, Larry B, PA-C  polyethylene glycol (MIRALAX / GLYCOLAX) packet Take 17 g by mouth daily as needed for mild constipation. 06/28/17   Glade LloydAlekh, Kshitiz, MD    Physical Exam: Vitals:   05/21/21 0100 05/21/21 0115 05/21/21 0130 05/21/21 0145  BP: 126/67  128/67   Pulse: 80 78 79 79  Resp: 18 17 14 19   Temp:      TempSrc:      SpO2: 95% 94% 95% 93%  Weight:      Height:        Constitutional: NAD, calm  Eyes: PERTLA, lids and conjunctivae  normal ENMT: Mucous membranes are moist. Posterior pharynx clear of any exudate or lesions.   Neck: supple, no masses  Respiratory: no wheezing, no crackles. No accessory muscle use.  Cardiovascular: S1 & S2 heard, regular rate and rhythm. No extremity edema.   Abdomen: No distension, no tenderness, soft. Bowel sounds active.  Musculoskeletal: no clubbing / cyanosis. No joint deformity upper and lower extremities.   Skin: Lichenified dermatitis involving face. Warm, dry, well-perfused. Neurologic: CN 2-12 grossly intact. Moving all extremities. Alert and oriented to person only.  Psychiatric: Pleasant. Cooperative.    Labs and Imaging on Admission: I have personally reviewed following labs and imaging studies  CBC: Recent Labs  Lab 05/20/21 2027  WBC 11.3*  NEUTROABS 9.9*  HGB 12.7  HCT 38.2  MCV 92.7  PLT 182   Basic Metabolic Panel: Recent Labs  Lab 05/20/21 2027  NA 143  K 4.0  CL 110  CO2 24  GLUCOSE 153*  BUN 28*  CREATININE 0.77  CALCIUM 8.7*   GFR: Estimated Creatinine Clearance: 32.2 mL/min (by C-G formula based on SCr of 0.77 mg/dL). Liver Function Tests: Recent Labs  Lab 05/20/21 2027  AST 36  ALT 22  ALKPHOS 61  BILITOT 0.8  PROT 7.0  ALBUMIN 3.4*   No results for input(s): LIPASE, AMYLASE in the last 168 hours. No results for input(s): AMMONIA in the last 168 hours. Coagulation Profile: No results for input(s): INR, PROTIME in the last 168 hours. Cardiac Enzymes: No results for input(s): CKTOTAL, CKMB, CKMBINDEX, TROPONINI in the last 168 hours. BNP (last 3 results) No results for input(s): PROBNP in the last 8760 hours. HbA1C: No results for input(s): HGBA1C in the last 72 hours. CBG: No results for input(s): GLUCAP in the last 168 hours. Lipid Profile: No results for input(s): CHOL, HDL, LDLCALC, TRIG, CHOLHDL, LDLDIRECT in the last 72 hours. Thyroid Function Tests: No results for input(s): TSH, T4TOTAL, FREET4, T3FREE, THYROIDAB in the  last 72 hours. Anemia Panel: No results for input(s): VITAMINB12, FOLATE, FERRITIN, TIBC, IRON, RETICCTPCT in the last 72 hours. Urine analysis:    Component Value Date/Time   COLORURINE YELLOW 05/20/2021 2134   APPEARANCEUR CLOUDY (A) 05/20/2021 2134   LABSPEC 1.018 05/20/2021 2134   PHURINE 5.0 05/20/2021 2134   GLUCOSEU NEGATIVE 05/20/2021 2134   HGBUR MODERATE (A) 05/20/2021 2134   BILIRUBINUR NEGATIVE 05/20/2021 2134   KETONESUR NEGATIVE 05/20/2021 2134   PROTEINUR 30 (A) 05/20/2021 2134   NITRITE NEGATIVE 05/20/2021 2134   LEUKOCYTESUR LARGE (A) 05/20/2021 2134   Sepsis Labs: @LABRCNTIP (procalcitonin:4,lacticidven:4) ) Recent Results (from the past 240 hour(s))  Resp Panel by RT-PCR (Flu A&B, Covid) Nasopharyngeal Swab     Status: None   Collection Time: 05/20/21  8:27 PM   Specimen: Nasopharyngeal Swab; Nasopharyngeal(NP) swabs in vial transport medium  Result Value Ref Range Status   SARS Coronavirus 2 by RT  PCR NEGATIVE NEGATIVE Final    Comment: (NOTE) SARS-CoV-2 target nucleic acids are NOT DETECTED.  The SARS-CoV-2 RNA is generally detectable in upper respiratory specimens during the acute phase of infection. The lowest concentration of SARS-CoV-2 viral copies this assay can detect is 138 copies/mL. A negative result does not preclude SARS-Cov-2 infection and should not be used as the sole basis for treatment or other patient management decisions. A negative result may occur with  improper specimen collection/handling, submission of specimen other than nasopharyngeal swab, presence of viral mutation(s) within the areas targeted by this assay, and inadequate number of viral copies(<138 copies/mL). A negative result must be combined with clinical observations, patient history, and epidemiological information. The expected result is Negative.  Fact Sheet for Patients:  BloggerCourse.com  Fact Sheet for Healthcare Providers:   SeriousBroker.it  This test is no t yet approved or cleared by the [place] FDA and  has been authorized for detection and/or diagnosis of SARS-CoV-2 by FDA under an Emergency Use Authorization (EUA). This EUA will remain  in effect (meaning this test can be used) for the duration of the COVID-19 declaration under Section 564(b)(1) of the Act, 21 U.S.C.section 360bbb-3(b)(1), unless the authorization is terminated  or revoked sooner.       Influenza A by PCR NEGATIVE NEGATIVE Final   Influenza B by PCR NEGATIVE NEGATIVE Final    Comment: (NOTE) The Xpert Xpress SARS-CoV-2/FLU/RSV plus assay is intended as an aid in the diagnosis of influenza from Nasopharyngeal swab specimens and should not be used as a sole basis for treatment. Nasal washings and aspirates are unacceptable for Xpert Xpress SARS-CoV-2/FLU/RSV testing.  Fact Sheet for Patients: BloggerCourse.com  Fact Sheet for Healthcare Providers: SeriousBroker.it  This test is not yet approved or cleared by the [place] FDA and has been authorized for detection and/or diagnosis of SARS-CoV-2 by FDA under an Emergency Use Authorization (EUA). This EUA will remain in effect (meaning this test can be used) for the duration of the COVID-19 declaration under Section 564(b)(1) of the Act, 21 U.S.C. section 360bbb-3(b)(1), unless the authorization is terminated or revoked.  Performed at [facility], [address] [place], [place] [address]      Radiological Exams on Admission: CT Head Wo Contrast  Result Date: 05/20/2021 CLINICAL DATA:  Mental status change. EXAM: CT HEAD WITHOUT CONTRAST TECHNIQUE: Contiguous axial images were obtained from the base of the skull through the vertex without intravenous contrast. RADIATION DOSE REDUCTION: This exam was performed according to the departmental dose-optimization program which  includes automated exposure control, adjustment of the mA and/or kV according to patient size and/or use of iterative reconstruction technique. COMPARISON:  Head CT 06/29/2018 FINDINGS: Brain: Stable age related cerebral atrophy, ventriculomegaly and severe periventricular white matter disease. No extra-axial fluid collections are identified. No CT findings for acute hemispheric infarction or intracranial hemorrhage. No mass lesions. The brainstem and cerebellum are normal. Vascular: Stable appearing vascular calcifications but no aneurysm or hyperdense vessels. Skull: No skull fracture or bone lesions. Sinuses/Orbits: There is fluid in the left half of the sphenoid sinus which could suggest acute sphenoid sinusitis. The other paranasal sinuses are clear and the mastoid air cells and middle ear cavities are clear. The globes are intact. Other: No scalp lesions or scalp hematoma. IMPRESSION: 1. Stable age related cerebral atrophy, ventriculomegaly and severe periventricular white matter disease. 2. No acute intracranial findings or mass lesions. 3. Fluid in the left half of the sphenoid sinus could suggest  acute sphenoid sinusitis. Electronically Signed   By: Rudie Meyer M.D.   On: 05/20/2021 20:54   DG Chest Port 1 View  Result Date: 05/20/2021 CLINICAL DATA:  Questionable sepsis. EXAM: PORTABLE CHEST 1 VIEW COMPARISON:  Chest x-ray 06/29/2018 FINDINGS: There are patchy opacities in scratched at the left lung base. Cardiomediastinal silhouette is within normal limits. There is no evidence for pleural effusion or pneumothorax. No acute fractures are seen. IMPRESSION: 1. Patchy opacities in the left lung base may represent atelectasis and or infection. Electronically Signed   By: Darliss Cheney M.D.   On: 05/20/2021 20:46     Assessment/Plan   1. Sepsis secondary to UTI  - Presents with fever and confusion and found to have UTI  - Blood and urine cultures collected in ED and antibiotics were started  -  Continue Rocephin, trend lactate, follow cultures and clinical course    2. Acute encephalopathy  - No acute intracranial finding on head CT - Anticipate improvement with treatment of sepsis, delirium precautions     DVT prophylaxis: Lovenox  Code Status: Full  Level of Care: Level of care: Progressive Family Communication: None present  Disposition Plan:  Patient is from: SNF  Anticipated d/c is to: SNF  Anticipated d/c date is: 05/23/21  Patient currently: pending improvement in sepsis parameters, mental status  Consults called: none  Admission status: Inpatient     Briscoe Deutscher, MD [facility]  05/21/2021, 2:14 AM

## 2021-05-21 NOTE — Progress Notes (Signed)
PROGRESS NOTE    Christine Jennings  OIB:[id number] DOB: 16-Jun-1927 DOA: 05/20/2021 PCP: Dois Davenport, MD   Brief Narrative:   88 y.o. female with medical history significant for osteoporosis presented with fevers and confusion.  On presentation, patient was febrile to 38.3 C, tachycardic and tachypneic with lactic acidosis and mild leukocytosis.  CT of the head was negative for acute intracranial abnormity.  Chest x-ray was notable for patchy opacities in the left base, atelectasis versus infection.  There was concern for UTI.  She was started on broad-spectrum antibiotics and IV fluids.  Assessment & Plan:   Severe sepsis: Present on admission UTI: Present on admission Possible community-acquired bacterial pneumonia: Present on admission Leukocytosis: Resolved Lactic acidosis: Resolved -Presented with fever, tachycardia, tachypnea, lactic acidosis, leukocytosis with possible pneumonia and UTI -Continue Rocephin and add Zithromax.  Continue IV fluids.  COVID-19 and influenza negative on presentation.  Follow urine and blood cultures. -Currently on room air  Acute metabolic encephalopathy -Possibly from above.  Monitor mental status.  Fall precautions.  PT eval.  Empirically start folic acid.  Check TSH, B12 and ammonia level in AM.  Possible anemia of chronic disease -Hemoglobin stable.  No signs of bleeding.  Monitor intermittently  DVT prophylaxis: Lovenox Code Status: Full Family Communication: None at bedside Disposition Plan: Status is: Inpatient Remains inpatient appropriate because: Of severity of illness.  Need for IV antibiotics and fluids.    Consultants: None  Procedures: None  Antimicrobials: Rocephin   Subjective: Patient seen and examined at bedside.  Hard of hearing.  Poor historian.  Does not feel well.  Does not elaborate on symptoms but wants to go home.  Had fever on presentation.  No seizures, vomiting, agitation reported.  Objective: Vitals:    05/21/21 0454 05/21/21 0616 05/21/21 0745 05/21/21 1000  BP: 136/75 130/72 130/72 (!) 153/104  Pulse: 73 76 76 87  Resp: 17 17 18 14   Temp:      TempSrc:      SpO2: 99% 100% 99% 95%  Weight:      Height:       No intake or output data in the 24 hours ending 05/21/21 1132 Filed Weights   05/20/21 2141  Weight: 45.4 kg    Examination:  General exam: Appears calm and comfortable.  Elderly female lying in bed.  Hard of hearing.  On room air. Respiratory system: Bilateral decreased breath sounds at bases with some scattered crackles Cardiovascular system: S1 & S2 heard, Rate controlled Gastrointestinal system: Abdomen is nondistended, soft and nontender. Normal bowel sounds heard. Extremities: No cyanosis, clubbing, edema  Central nervous system: Awake, slow to respond, poor historian.  No focal neurological deficits. Moving extremities Skin: No rashes, lesions or ulcers Psychiatry: Affect is flat.  Does not participate in conversation much.    Data Reviewed: I have personally reviewed following labs and imaging studies  CBC: Recent Labs  Lab 05/20/21 2027 05/21/21 0604  WBC 11.3* 7.6  NEUTROABS 9.9*  --   HGB 12.7 11.4*  HCT 38.2 34.3*  MCV 92.7 94.2  PLT 182 151   Basic Metabolic Panel: Recent Labs  Lab 05/20/21 2027 05/21/21 0604  NA 143 141  K 4.0 3.7  CL 110 109  CO2 24 27  GLUCOSE 153* 100*  BUN 28* 25*  CREATININE 0.77 0.60  CALCIUM 8.7* 8.3*   GFR: Estimated Creatinine Clearance: 32.2 mL/min (by C-G formula based on SCr of 0.6 mg/dL). Liver Function Tests: Recent Labs  Lab  05/20/21 2027  AST 36  ALT 22  ALKPHOS 61  BILITOT 0.8  PROT 7.0  ALBUMIN 3.4*   No results for input(s): LIPASE, AMYLASE in the last 168 hours. No results for input(s): AMMONIA in the last 168 hours. Coagulation Profile: Recent Labs  Lab 05/21/21 0604  INR 1.2   Cardiac Enzymes: No results for input(s): CKTOTAL, CKMB, CKMBINDEX, TROPONINI in the last 168 hours. BNP  (last 3 results) No results for input(s): PROBNP in the last 8760 hours. HbA1C: No results for input(s): HGBA1C in the last 72 hours. CBG: No results for input(s): GLUCAP in the last 168 hours. Lipid Profile: No results for input(s): CHOL, HDL, LDLCALC, TRIG, CHOLHDL, LDLDIRECT in the last 72 hours. Thyroid Function Tests: No results for input(s): TSH, T4TOTAL, FREET4, T3FREE, THYROIDAB in the last 72 hours. Anemia Panel: No results for input(s): VITAMINB12, FOLATE, FERRITIN, TIBC, IRON, RETICCTPCT in the last 72 hours. Sepsis Labs: Recent Labs  Lab 05/20/21 2027 05/20/21 2227 05/21/21 0325 05/21/21 0604  LATICACIDVEN 3.8* 3.7* 1.9 1.3    Recent Results (from the past 240 hour(s))  Resp Panel by RT-PCR (Flu A&B, Covid) Nasopharyngeal Swab     Status: None   Collection Time: 05/20/21  8:27 PM   Specimen: Nasopharyngeal Swab; Nasopharyngeal(NP) swabs in vial transport medium  Result Value Ref Range Status   SARS Coronavirus 2 by RT PCR NEGATIVE NEGATIVE Final    Comment: (NOTE) SARS-CoV-2 target nucleic acids are NOT DETECTED.  The SARS-CoV-2 RNA is generally detectable in upper respiratory specimens during the acute phase of infection. The lowest concentration of SARS-CoV-2 viral copies this assay can detect is 138 copies/mL. A negative result does not preclude SARS-Cov-2 infection and should not be used as the sole basis for treatment or other patient management decisions. A negative result may occur with  improper specimen collection/handling, submission of specimen other than nasopharyngeal swab, presence of viral mutation(s) within the areas targeted by this assay, and inadequate number of viral copies(<138 copies/mL). A negative result must be combined with clinical observations, patient history, and epidemiological information. The expected result is Negative.  Fact Sheet for Patients:  BloggerCourse.com  Fact Sheet for Healthcare Providers:   SeriousBroker.it  This test is no t yet approved or cleared by the [place] FDA and  has been authorized for detection and/or diagnosis of SARS-CoV-2 by FDA under an Emergency Use Authorization (EUA). This EUA will remain  in effect (meaning this test can be used) for the duration of the COVID-19 declaration under Section 564(b)(1) of the Act, 21 U.S.C.section 360bbb-3(b)(1), unless the authorization is terminated  or revoked sooner.       Influenza A by PCR NEGATIVE NEGATIVE Final   Influenza B by PCR NEGATIVE NEGATIVE Final    Comment: (NOTE) The Xpert Xpress SARS-CoV-2/FLU/RSV plus assay is intended as an aid in the diagnosis of influenza from Nasopharyngeal swab specimens and should not be used as a sole basis for treatment. Nasal washings and aspirates are unacceptable for Xpert Xpress SARS-CoV-2/FLU/RSV testing.  Fact Sheet for Patients: BloggerCourse.com  Fact Sheet for Healthcare Providers: SeriousBroker.it  This test is not yet approved or cleared by the [place] FDA and has been authorized for detection and/or diagnosis of SARS-CoV-2 by FDA under an Emergency Use Authorization (EUA). This EUA will remain in effect (meaning this test can be used) for the duration of the COVID-19 declaration under Section 564(b)(1) of the Act, 21 U.S.C. section 360bbb-3(b)(1), unless the authorization is terminated or revoked.  Performed at [facility], [address] [place], [place] [address]   Blood Culture (routine x 2)     Status: None (Preliminary result)   Collection Time: 05/20/21  8:30 PM   Specimen: BLOOD  Result Value Ref Range Status   Specimen Description   Final    BLOOD BLOOD RIGHT FOREARM Performed at [facility], [address] [place], [place] [address]    Special Requests   Final    BOTTLES DRAWN AEROBIC AND ANAEROBIC Blood Culture  results may not be optimal due to an excessive volume of blood received in culture bottles Performed at [facility], [address] [place], [place] [address]    Culture   Final    NO GROWTH < 12 HOURS Performed at [facility], [address] [place], [place] [address]    Report Status PENDING  Incomplete  Blood Culture (routine x 2)     Status: None (Preliminary result)   Collection Time: 05/20/21  9:05 PM   Specimen: BLOOD  Result Value Ref Range Status   Specimen Description   Final    BLOOD BLOOD LEFT ARM Performed at [facility], [address] [place], [place] [address]    Special Requests   Final    BOTTLES DRAWN AEROBIC AND ANAEROBIC Blood Culture adequate volume Performed at [facility], [address] [place], [place] [address]    Culture   Final    NO GROWTH < 12 HOURS Performed at [facility], [address] [place], [place] [address]    Report Status PENDING  Incomplete         Radiology Studies: CT Head Wo Contrast  Result Date: 05/20/2021 CLINICAL DATA:  Mental status change. EXAM: CT HEAD WITHOUT CONTRAST TECHNIQUE: Contiguous axial images were obtained from the base of the skull through the vertex without intravenous contrast. RADIATION DOSE REDUCTION: This exam was performed according to the departmental dose-optimization program which includes automated exposure control, adjustment of the mA and/or kV according to patient size and/or use of iterative reconstruction technique. COMPARISON:  Head CT 06/29/2018 FINDINGS: Brain: Stable age related cerebral atrophy, ventriculomegaly and severe periventricular white matter disease. No extra-axial fluid collections are identified. No CT findings for acute hemispheric infarction or intracranial hemorrhage. No mass lesions. The brainstem and cerebellum are normal. Vascular: Stable appearing vascular calcifications but no aneurysm or hyperdense  vessels. Skull: No skull fracture or bone lesions. Sinuses/Orbits: There is fluid in the left half of the sphenoid sinus which could suggest acute sphenoid sinusitis. The other paranasal sinuses are clear and the mastoid air cells and middle ear cavities are clear. The globes are intact. Other: No scalp lesions or scalp hematoma. IMPRESSION: 1. Stable age related cerebral atrophy, ventriculomegaly and severe periventricular white matter disease. 2. No acute intracranial findings or mass lesions. 3. Fluid in the left half of the sphenoid sinus could suggest acute sphenoid sinusitis. Electronically Signed   By: Rudie MeyerP.  Gallerani M.D.   On: 05/20/2021 20:54   DG Chest Port 1 View  Result Date: 05/20/2021 CLINICAL DATA:  Questionable sepsis. EXAM: PORTABLE CHEST 1 VIEW COMPARISON:  Chest x-ray 06/29/2018 FINDINGS: There are patchy opacities in scratched at the left lung base. Cardiomediastinal silhouette is within normal limits. There is no evidence for pleural effusion or pneumothorax. No acute fractures are seen. IMPRESSION: 1. Patchy opacities in the left lung base may represent atelectasis and or infection. Electronically  Signed   By: Darliss Cheney M.D.   On: 05/20/2021 20:46        Scheduled Meds:  enoxaparin (LOVENOX) injection  30 mg Subcutaneous Q24H   sodium chloride flush  3 mL Intravenous Q12H   Continuous Infusions:  cefTRIAXone (ROCEPHIN)  IV Stopped (05/20/21 2133)   lactated ringers 100 mL/hr at 05/21/21 0255          Glade Lloyd, MD [facility] 05/21/2021, 11:32 AM

## 2021-05-22 DIAGNOSIS — D72829 Elevated white blood cell count, unspecified: Secondary | ICD-10-CM

## 2021-05-22 DIAGNOSIS — A419 Sepsis, unspecified organism: Secondary | ICD-10-CM | POA: Diagnosis not present

## 2021-05-22 DIAGNOSIS — G9340 Encephalopathy, unspecified: Secondary | ICD-10-CM | POA: Diagnosis not present

## 2021-05-22 DIAGNOSIS — J189 Pneumonia, unspecified organism: Secondary | ICD-10-CM | POA: Diagnosis not present

## 2021-05-22 DIAGNOSIS — E872 Acidosis, unspecified: Secondary | ICD-10-CM | POA: Diagnosis not present

## 2021-05-22 LAB — BASIC METABOLIC PANEL
Anion gap: 5 (ref 5–15)
BUN: 13 mg/dL (ref 8–23)
CO2: 24 mmol/L (ref 22–32)
Calcium: 7.9 mg/dL — ABNORMAL LOW (ref 8.9–10.3)
Chloride: 105 mmol/L (ref 98–111)
Creatinine, Ser: 0.57 mg/dL (ref 0.44–1.00)
GFR, Estimated: >60 mL/min (ref >60)
Glucose, Bld: 90 mg/dL (ref 70–99)
Potassium: 3.6 mmol/L (ref 3.5–5.1)
Sodium: 134 mmol/L — ABNORMAL LOW (ref 135–145)

## 2021-05-22 LAB — TSH: TSH: 2.954 u[IU]/mL (ref 0.350–4.500)

## 2021-05-22 LAB — CBC
HCT: 37.7 % (ref 36.0–46.0)
Hemoglobin: 12.7 g/dL (ref 12.0–15.0)
MCH: 31.3 pg (ref 26.0–34.0)
MCHC: 33.7 g/dL (ref 30.0–36.0)
MCV: 92.9 fL (ref 80.0–100.0)
Platelets: 157 10*3/uL (ref 150–400)
RBC: 4.06 MIL/uL (ref 3.87–5.11)
RDW: 13.4 % (ref 11.5–15.5)
WBC: 6.4 10*3/uL (ref 4.0–10.5)
nRBC: 0 % (ref 0.0–0.2)

## 2021-05-22 LAB — URINE CULTURE: Culture: >=100000 — AB

## 2021-05-22 LAB — MAGNESIUM: Magnesium: 2 mg/dL (ref 1.7–2.4)

## 2021-05-22 LAB — AMMONIA: Ammonia: 22 umol/L (ref 9–35)

## 2021-05-22 LAB — VITAMIN B12: Vitamin B-12: 210 pg/mL (ref 180–914)

## 2021-05-22 MED ORDER — LIP MEDEX EX OINT
TOPICAL_OINTMENT | CUTANEOUS | Status: DC | PRN
  Filled 2021-05-22: qty 7

## 2021-05-22 MED ORDER — ENSURE ENLIVE PO LIQD: 237.0000 mL | Freq: Two times a day (BID) | ORAL | Status: DC

## 2021-05-22 MED ORDER — ENOXAPARIN SODIUM 40 MG/0.4ML IJ SOSY
40.0000 mg | PREFILLED_SYRINGE | INTRAMUSCULAR | Status: DC
  Administered 2021-05-23 – 2021-05-24 (×2): 40 mg via SUBCUTANEOUS
  Filled 2021-05-22 (×2): qty 0.4

## 2021-05-22 MED ORDER — AZITHROMYCIN 250 MG PO TABS
500.0000 mg | ORAL_TABLET | Freq: Every day | ORAL | Status: DC
  Administered 2021-05-22 – 2021-05-24 (×3): 500 mg via ORAL
  Filled 2021-05-22 (×3): qty 2

## 2021-05-22 MED ORDER — CYANOCOBALAMIN 1000 MCG/ML IJ SOLN
1000.0000 ug | Freq: Once | INTRAMUSCULAR | Status: AC
  Administered 2021-05-22: 1000 ug via INTRAMUSCULAR
  Filled 2021-05-22 (×2): qty 1

## 2021-05-22 MED ORDER — VITAMIN B-12 1000 MCG PO TABS
1000.0000 ug | ORAL_TABLET | Freq: Every day | ORAL | Status: DC
  Administered 2021-05-23 – 2021-05-24 (×2): 1000 ug via ORAL
  Filled 2021-05-22 (×2): qty 1

## 2021-05-22 MED ORDER — ADULT MULTIVITAMIN W/MINERALS CH
1.0000 | ORAL_TABLET | Freq: Every day | ORAL | Status: DC
  Administered 2021-05-22 – 2021-05-24 (×3): 1 via ORAL
  Filled 2021-05-22 (×3): qty 1

## 2021-05-22 NOTE — Progress Notes (Signed)
Initial Nutrition Assessment  DOCUMENTATION CODES:   Not applicable  INTERVENTION:   -MVI with minerals daily -Ensure Enlive po TID, each supplement provides 350 kcal and 20 grams of protein -Liberalize diet to regular to offer widest variety of meal selections -Feeding assistance with meals  NUTRITION DIAGNOSIS:   Inadequate oral intake related to poor appetite as evidenced by meal completion < 25%.  GOAL:   Patient will meet greater than or equal to 90% of their needs  MONITOR:   PO intake, Supplement acceptance  REASON FOR ASSESSMENT:   Malnutrition Screening Tool    ASSESSMENT:   Pt with medical history significant for osteoporosis who presents with fevers and confusion.  Pt admitted with severe sepsis and UTI.   Reviewed I/O's: -380 ml x 24 hours  UOP: 700 ml x 24 hours  Pt unavailable at time of visit. Attempted to speak with pt via call to hospital room phone, however, unable to reach. RD unable to obtain further nutrition-related history or complete nutrition-focused physical exam at this time.    Pt currently on a heart healthy diet. Meal completions documented at 0%.   Reviewed wt hx; no wt loss noted over the past 9 months.   Pt with decreased oral intake and would greatly benefit from addition of oral nutrition supplements.   Medications reviwed and include folic acid and vitamin B-12.   Labs reviewed: Na: 134.    Diet Order:   Diet Order             Diet Heart Room service appropriate? No; Fluid consistency: Thin  Diet effective now                   EDUCATION NEEDS:   No education needs have been identified at this time  Skin:  Skin Assessment: Reviewed RN Assessment  Last BM:  05/21/21  Height:   Ht Readings from Last 1 Encounters:  05/20/21 5\' 2"  (1.575 m)    Weight:   Wt Readings from Last 1 Encounters:  05/22/21 53.8 kg    Ideal Body Weight:  50 kg  BMI:  Body mass index is 21.69 kg/m.  Estimated Nutritional  Needs:   Kcal:  1300-1500  Protein:  60-75 grams  Fluid:  > 1.3 L    05/24/21, RD, LDN, CDCES Registered Dietitian II Certified Diabetes Care and Education Specialist Please refer to [facility] for RD and/or RD on-call/weekend/after hours pager

## 2021-05-22 NOTE — Progress Notes (Signed)
PROGRESS NOTE    Christine Jennings  [id number] DOB: 02-Apr-1927 DOA: 05/20/2021 PCP: Dois Davenportichter, Karen L, MD   Brief Narrative:   88 y.o. female with medical history significant for osteoporosis presented with fevers and confusion.  On presentation, patient was febrile to 38.3 C, tachycardic and tachypneic with lactic acidosis and mild leukocytosis.  CT of the head was negative for acute intracranial abnormity.  Chest x-ray was notable for patchy opacities in the left base, atelectasis versus infection.  There was concern for UTI.  She was started on broad-spectrum antibiotics and IV fluids.  Assessment & Plan:   Severe sepsis: Present on admission UTI: Present on admission Possible community-acquired bacterial pneumonia: Present on admission Leukocytosis: Resolved Lactic acidosis: Resolved -Presented with fever, tachycardia, tachypnea, lactic acidosis, leukocytosis with possible pneumonia and UTI -Continue Rocephin and add Zithromax.  COVID-19 and influenza negative on presentation.  Follow urine and blood cultures. -Currently on room air  Acute metabolic encephalopathy -Possibly from above.  Monitor mental status.  Fall precautions.  PT eval.  Continue empiric folic acid.    Possible vitamin B12 deficiency -Vitamin B12 210, at the lower limit of normal.  Start supplementation  Hyponatremia -Mild.  Monitor  Possible anemia of chronic disease -Hemoglobin stable.  No signs of bleeding.  Monitor intermittently  DVT prophylaxis: Lovenox Code Status: Full Family Communication: None at bedside Disposition Plan: Status is: Inpatient Remains inpatient appropriate because: Of severity of illness.  Need for IV antibiotics and fluids.    Consultants: None  Procedures: None  Antimicrobials: Rocephin   Subjective: Patient seen and examined at bedside.  Hard of hearing.  Poor historian.  Does not feel well.  No overnight fever, vomiting, seizures, agitation reported.  Feels  slightly better. Objective: Vitals:   05/21/21 2318 05/22/21 0417 05/22/21 0434 05/22/21 0500  BP: (!) 158/93 (!) 163/100 (!) 166/81   Pulse: 91 87 83   Resp: 18 14 20    Temp: 98.8 F (37.1 C) 98.3 F (36.8 C) 98.7 F (37.1 C)   TempSrc: Oral Oral Oral   SpO2: 91% 92% 91%   Weight:    53.8 kg  Height:        Intake/Output Summary (Last 24 hours) at 05/22/2021 1016 Last data filed at 05/22/2021 1000 Gross per 24 hour  Intake 560 ml  Output 700 ml  Net -140 ml   Filed Weights   05/20/21 2141 05/22/21 0500  Weight: 45.4 kg 53.8 kg    Examination:  General: On room air.  No distress.  Elderly female lying in bed.  Hard of hearing. ENT/neck: No thyromegaly.  JVD is not elevated  respiratory: Decreased breath sounds at bases bilaterally with some crackles; no wheezing CVS: S1-S2 heard, rate controlled Abdominal: Soft, nontender, slightly distended; no organomegaly, normal bowel sounds are heard Extremities: Trace lower extremity edema; no cyanosis  CNS: Awake, slow to respond, poor historian.  No focal neurologic deficit.  Moves extremities Lymph: No obvious lymphadenopathy Skin: No obvious ecchymosis/lesions  psych: Flat affect.  Currently not agitated.  Musculoskeletal: No obvious joint swelling/deformity .    Data Reviewed: I have personally reviewed following labs and imaging studies  CBC: Recent Labs  Lab 05/20/21 2027 05/21/21 0604 05/22/21 0548  WBC 11.3* 7.6 6.4  NEUTROABS 9.9*  --   --   HGB 12.7 11.4* 12.7  HCT 38.2 34.3* 37.7  MCV 92.7 94.2 92.9  PLT 182 151 157    Basic Metabolic Panel: Recent Labs  Lab 05/20/21 2027  05/21/21 0604 05/22/21 0548  NA 143 141 134*  K 4.0 3.7 3.6  CL 110 109 105  CO2 GLUCOSE 153* 100* 90  BUN 28* 25* 13  CREATININE 0.77 0.60 0.57  CALCIUM 8.7* 8.3* 7.9*  MG  --   --  2.0    GFR: Estimated Creatinine Clearance: 35.5 mL/min (by C-G formula based on SCr of 0.57 mg/dL). Liver Function Tests: Recent  Labs  Lab 05/20/21 2027  AST 36  ALT 22  ALKPHOS 61  BILITOT 0.8  PROT 7.0  ALBUMIN 3.4*    No results for input(s): LIPASE, AMYLASE in the last 168 hours. Recent Labs  Lab 05/22/21 0548  AMMONIA 22   Coagulation Profile: Recent Labs  Lab 05/21/21 0604  INR 1.2    Cardiac Enzymes: No results for input(s): CKTOTAL, CKMB, CKMBINDEX, TROPONINI in the last 168 hours. BNP (last 3 results) No results for input(s): PROBNP in the last 8760 hours. HbA1C: No results for input(s): HGBA1C in the last 72 hours. CBG: No results for input(s): GLUCAP in the last 168 hours. Lipid Profile: No results for input(s): CHOL, HDL, LDLCALC, TRIG, CHOLHDL, LDLDIRECT in the last 72 hours. Thyroid Function Tests: Recent Labs    05/22/21 0548  TSH 2.954   Anemia Panel: Recent Labs    05/22/21 0548  VITAMINB12 210   Sepsis Labs: Recent Labs  Lab 05/20/21 2027 05/20/21 2227 05/21/21 0325 05/21/21 0604  LATICACIDVEN 3.8* 3.7* 1.9 1.3     Recent Results (from the past 240 hour(s))  Resp Panel by RT-PCR (Flu A&B, Covid) Nasopharyngeal Swab     Status: None   Collection Time: 05/20/21  8:27 PM   Specimen: Nasopharyngeal Swab; Nasopharyngeal(NP) swabs in vial transport medium  Result Value Ref Range Status   SARS Coronavirus 2 by RT PCR NEGATIVE NEGATIVE Final    Comment: (NOTE) SARS-CoV-2 target nucleic acids are NOT DETECTED.  The SARS-CoV-2 RNA is generally detectable in upper respiratory specimens during the acute phase of infection. The lowest concentration of SARS-CoV-2 viral copies this assay can detect is 138 copies/mL. A negative result does not preclude SARS-Cov-2 infection and should not be used as the sole basis for treatment or other patient management decisions. A negative result may occur with  improper specimen collection/handling, submission of specimen other than nasopharyngeal swab, presence of viral mutation(s) within the areas targeted by this assay, and  inadequate number of viral copies(<138 copies/mL). A negative result must be combined with clinical observations, patient history, and epidemiological information. The expected result is Negative.  Fact Sheet for Patients:  BloggerCourse.com  Fact Sheet for Healthcare Providers:  SeriousBroker.it  This test is no t yet approved or cleared by the [place] FDA and  has been authorized for detection and/or diagnosis of SARS-CoV-2 by FDA under an Emergency Use Authorization (EUA). This EUA will remain  in effect (meaning this test can be used) for the duration of the COVID-19 declaration under Section 564(b)(1) of the Act, 21 U.S.C.section 360bbb-3(b)(1), unless the authorization is terminated  or revoked sooner.       Influenza A by PCR NEGATIVE NEGATIVE Final   Influenza B by PCR NEGATIVE NEGATIVE Final    Comment: (NOTE) The Xpert Xpress SARS-CoV-2/FLU/RSV plus assay is intended as an aid in the diagnosis of influenza from Nasopharyngeal swab specimens and should not be used as a sole basis for treatment. Nasal washings and aspirates are unacceptable for Xpert Xpress SARS-CoV-2/FLU/RSV testing.  Fact Sheet for  Patients: BloggerCourse.com  Fact Sheet for Healthcare Providers: SeriousBroker.it  This test is not yet approved or cleared by the [place] FDA and has been authorized for detection and/or diagnosis of SARS-CoV-2 by FDA under an Emergency Use Authorization (EUA). This EUA will remain in effect (meaning this test can be used) for the duration of the COVID-19 declaration under Section 564(b)(1) of the Act, 21 U.S.C. section 360bbb-3(b)(1), unless the authorization is terminated or revoked.  Performed at [facility], [address] [place], [place] [address]   Blood Culture (routine x 2)     Status: None (Preliminary result)   Collection  Time: 05/20/21  8:30 PM   Specimen: BLOOD  Result Value Ref Range Status   Specimen Description   Final    BLOOD BLOOD RIGHT FOREARM Performed at [facility], [address] [place], [place] [address]    Special Requests   Final    BOTTLES DRAWN AEROBIC AND ANAEROBIC Blood Culture results may not be optimal due to an excessive volume of blood received in culture bottles Performed at [facility], [address] [place], [place] [address]    Culture   Final    NO GROWTH < 12 HOURS Performed at [facility], [address], [place], [place] [address]    Report Status PENDING  Incomplete  Blood Culture (routine x 2)     Status: None (Preliminary result)   Collection Time: 05/20/21  9:05 PM   Specimen: BLOOD  Result Value Ref Range Status   Specimen Description   Final    BLOOD BLOOD LEFT ARM Performed at [facility], [address], [place], [place] [address]    Special Requests   Final    BOTTLES DRAWN AEROBIC AND ANAEROBIC Blood Culture adequate volume Performed at [facility], [address] [place], [place] [address]    Culture   Final    NO GROWTH < 12 HOURS Performed at [facility], [address] [place], [place] [address]    Report Status PENDING  Incomplete  Urine Culture     Status: Abnormal   Collection Time: 05/20/21  9:34 PM   Specimen: In/Out Cath Urine  Result Value Ref Range Status   Specimen Description   Final    IN/OUT CATH URINE Performed at [facility], [address] [place], [place] [address]    Special Requests   Final    NONE Performed at [facility], [address] [place], [place] [address]    Culture >=100,000 COLONIES/mL ESCHERICHIA COLI (A)  Final   Report Status 05/22/2021 FINAL  Final   Organism ID, Bacteria ESCHERICHIA COLI (A)  Final      Susceptibility   Escherichia coli - MIC*    AMPICILLIN 4  SENSITIVE Sensitive     CEFAZOLIN <=4 SENSITIVE Sensitive     CEFEPIME <=0.12 SENSITIVE Sensitive     CEFTRIAXONE <=0.25 SENSITIVE Sensitive     CIPROFLOXACIN <=0.25 SENSITIVE Sensitive     GENTAMICIN <=1 SENSITIVE Sensitive     IMIPENEM <=0.25 SENSITIVE Sensitive     NITROFURANTOIN <=16 SENSITIVE Sensitive     TRIMETH/SULFA <=20 SENSITIVE Sensitive     AMPICILLIN/SULBACTAM <=2 SENSITIVE Sensitive     PIP/TAZO <=4 SENSITIVE Sensitive     * >=100,000 COLONIES/mL ESCHERICHIA COLI          Radiology Studies: CT Head Wo Contrast  Result Date: 05/20/2021  CLINICAL DATA:  Mental status change. EXAM: CT HEAD WITHOUT CONTRAST TECHNIQUE: Contiguous axial images were obtained from the base of the skull through the vertex without intravenous contrast. RADIATION DOSE REDUCTION: This exam was performed according to the departmental dose-optimization program which includes automated exposure control, adjustment of the mA and/or kV according to patient size and/or use of iterative reconstruction technique. COMPARISON:  Head CT 06/29/2018 FINDINGS: Brain: Stable age related cerebral atrophy, ventriculomegaly and severe periventricular white matter disease. No extra-axial fluid collections are identified. No CT findings for acute hemispheric infarction or intracranial hemorrhage. No mass lesions. The brainstem and cerebellum are normal. Vascular: Stable appearing vascular calcifications but no aneurysm or hyperdense vessels. Skull: No skull fracture or bone lesions. Sinuses/Orbits: There is fluid in the left half of the sphenoid sinus which could suggest acute sphenoid sinusitis. The other paranasal sinuses are clear and the mastoid air cells and middle ear cavities are clear. The globes are intact. Other: No scalp lesions or scalp hematoma. IMPRESSION: 1. Stable age related cerebral atrophy, ventriculomegaly and severe periventricular white matter disease. 2. No acute intracranial findings or mass lesions. 3.  Fluid in the left half of the sphenoid sinus could suggest acute sphenoid sinusitis. Electronically Signed   By: Rudie Meyer M.D.   On: 05/20/2021 20:54   DG Chest Port 1 View  Result Date: 05/20/2021 CLINICAL DATA:  Questionable sepsis. EXAM: PORTABLE CHEST 1 VIEW COMPARISON:  Chest x-ray 06/29/2018 FINDINGS: There are patchy opacities in scratched at the left lung base. Cardiomediastinal silhouette is within normal limits. There is no evidence for pleural effusion or pneumothorax. No acute fractures are seen. IMPRESSION: 1. Patchy opacities in the left lung base may represent atelectasis and or infection. Electronically Signed   By: Darliss Cheney M.D.   On: 05/20/2021 20:46        Scheduled Meds:  azithromycin  500 mg Oral Daily   cyanocobalamin  1,000 mcg Intramuscular Once   enoxaparin (LOVENOX) injection  30 mg Subcutaneous Q24H   folic acid  1 mg Oral Daily   sodium chloride flush  3 mL Intravenous Q12H   Continuous Infusions:  cefTRIAXone (ROCEPHIN)  IV 2 g (05/21/21 2031)          Glade Lloyd, MD [facility] 05/22/2021, 10:16 AM

## 2021-05-23 DIAGNOSIS — G9340 Encephalopathy, unspecified: Secondary | ICD-10-CM | POA: Diagnosis not present

## 2021-05-23 DIAGNOSIS — E538 Deficiency of other specified B group vitamins: Secondary | ICD-10-CM | POA: Diagnosis present

## 2021-05-23 DIAGNOSIS — J189 Pneumonia, unspecified organism: Secondary | ICD-10-CM | POA: Diagnosis not present

## 2021-05-23 DIAGNOSIS — A419 Sepsis, unspecified organism: Secondary | ICD-10-CM | POA: Diagnosis not present

## 2021-05-23 DIAGNOSIS — E872 Acidosis, unspecified: Secondary | ICD-10-CM | POA: Diagnosis not present

## 2021-05-23 LAB — CBC
HCT: 37.2 % (ref 36.0–46.0)
Hemoglobin: 12.6 g/dL (ref 12.0–15.0)
MCH: 31 pg (ref 26.0–34.0)
MCHC: 33.9 g/dL (ref 30.0–36.0)
MCV: 91.6 fL (ref 80.0–100.0)
Platelets: 160 10*3/uL (ref 150–400)
RBC: 4.06 MIL/uL (ref 3.87–5.11)
RDW: 13.2 % (ref 11.5–15.5)
WBC: 5 10*3/uL (ref 4.0–10.5)
nRBC: 0 % (ref 0.0–0.2)

## 2021-05-23 LAB — BASIC METABOLIC PANEL
Anion gap: 8 (ref 5–15)
BUN: 10 mg/dL (ref 8–23)
CO2: 28 mmol/L (ref 22–32)
Calcium: 8.4 mg/dL — ABNORMAL LOW (ref 8.9–10.3)
Chloride: 104 mmol/L (ref 98–111)
Creatinine, Ser: 0.61 mg/dL (ref 0.44–1.00)
GFR, Estimated: >60 mL/min (ref >60)
Glucose, Bld: 86 mg/dL (ref 70–99)
Potassium: 3.4 mmol/L — ABNORMAL LOW (ref 3.5–5.1)
Sodium: 140 mmol/L (ref 135–145)

## 2021-05-23 LAB — MAGNESIUM: Magnesium: 2.1 mg/dL (ref 1.7–2.4)

## 2021-05-23 MED ORDER — CYANOCOBALAMIN 1000 MCG PO TABS: 1000.0000 ug | ORAL_TABLET | Freq: Every day | ORAL | 0 refills | Status: DC

## 2021-05-23 MED ORDER — POTASSIUM CHLORIDE CRYS ER 20 MEQ PO TBCR
40.0000 meq | EXTENDED_RELEASE_TABLET | Freq: Once | ORAL | Status: AC
  Administered 2021-05-23: 40 meq via ORAL
  Filled 2021-05-23: qty 2

## 2021-05-23 MED ORDER — ENSURE ENLIVE PO LIQD: 237.0000 mL | Freq: Every day | ORAL | Status: AC | PRN

## 2021-05-23 MED ORDER — FOLIC ACID 1 MG PO TABS: 1.0000 mg | ORAL_TABLET | Freq: Every day | ORAL | 0 refills | Status: DC

## 2021-05-23 MED ORDER — HYDRALAZINE HCL 20 MG/ML IJ SOLN
5.0000 mg | INTRAMUSCULAR | Status: DC | PRN
Start: 2021-05-23 — End: 2021-05-24
  Administered 2021-05-23: 5 mg via INTRAVENOUS
  Filled 2021-05-23: qty 1

## 2021-05-23 MED ORDER — DOCUSATE SODIUM 100 MG PO CAPS: 100.0000 mg | ORAL_CAPSULE | Freq: Every day | ORAL | Status: AC | PRN

## 2021-05-23 MED ORDER — CEFUROXIME AXETIL 500 MG PO TABS
500.0000 mg | ORAL_TABLET | Freq: Two times a day (BID) | ORAL | 0 refills | Status: DC
Start: 2021-05-23 — End: 2021-05-24

## 2021-05-23 NOTE — Evaluation (Signed)
Physical Therapy Evaluation Patient Details Name: Christine Jennings MRN: [id number] DOB: 1927/10/30 Today's Date: 05/23/2021  History of Present Illness  Pt admitted from facility with fever, sepsis, UTI and AMS.  Pt with hx of osteoporosis, and bil hip fx with IM nailings  Clinical Impression  Pt admitted as above and presenting with function mobility limitations 2* generalized weakness, ambulatory balance deficits, and limited endurance.  This date, pt requiring steady assist to exit bed and ambulate 80' in hall with RW before c/o fatigue.  Pt would best be served by return to prior living arrangement at [facility] and follow up PT services to regain IND and maximize safety.     Recommendations for follow up therapy are one component of a multi-disciplinary discharge planning process, led by the attending physician.  Recommendations may be updated based on patient status, additional functional criteria and insurance authorization.  Follow Up Recommendations Other (comment) (PT follow up at [facility])    Assistance Recommended at Discharge Intermittent Supervision/Assistance  Patient can return home with the following  A little help with walking and/or transfers;A little help with bathing/dressing/bathroom;Assistance with cooking/housework;Assist for transportation;Help with stairs or ramp for entrance    Equipment Recommendations None recommended by PT  Recommendations for Other Services       Functional Status Assessment Patient has had a recent decline in their functional status and demonstrates the ability to make significant improvements in function in a reasonable and predictable amount of time.     Precautions / Restrictions Precautions Precautions: Fall Restrictions Weight Bearing Restrictions: No      Mobility  Bed Mobility Overal bed mobility: Needs Assistance Bed Mobility: Supine to Sit     Supine to sit: Min guard, Supervision     General bed  mobility comments: Increased time but no physical assist    Transfers Overall transfer level: Needs assistance Equipment used: Rolling walker (2 wheels) Transfers: Sit to/from Stand Sit to Stand: Min guard           General transfer comment: Steady assist to bring wt up and fwd and to balance in initial standing with RW    Ambulation/Gait Ambulation/Gait assistance: Min assist, Min guard Gait Distance (Feet): 70 Feet Assistive device: Rolling walker (2 wheels) Gait Pattern/deviations: Step-to pattern, Step-through pattern, Decreased step length - right, Decreased step length - left, Shuffle Gait velocity: decr     General Gait Details: Steady assist with cues for safety and position from RW; Distance ltd by fatigue "I just feel weak"  Stairs            Wheelchair Mobility    Modified Rankin (Stroke Patients Only)       Balance Overall balance assessment: Needs assistance Sitting-balance support: No upper extremity supported, Feet supported Sitting balance-Leahy Scale: Good     Standing balance support: Bilateral upper extremity supported Standing balance-Leahy Scale: Poor                               Pertinent Vitals/Pain      Home Living Family/patient expects to be discharged to:: Other (Comment)                   Additional Comments: [facility]    Prior Function Prior Level of Function : Needs assist  Cognitive Assist : Mobility (cognitive);ADLs (cognitive)           Mobility Comments: [facility]; pt used  RW and states she was able to get up and mobilize without assist       Hand Dominance   Dominant Hand: Right    Extremity/Trunk Assessment   Upper Extremity Assessment Upper Extremity Assessment: Generalized weakness    Lower Extremity Assessment Lower Extremity Assessment: Generalized weakness    Cervical / Trunk Assessment Cervical / Trunk Assessment: Kyphotic  Communication    Communication: HOH  Cognition Arousal/Alertness: Awake/alert Behavior During Therapy: Anxious (concerned about where her shoes might be) Overall Cognitive Status: History of cognitive impairments - at baseline                                          General Comments      Exercises     Assessment/Plan    PT Assessment Patient needs continued PT services  PT Problem List Decreased strength;Decreased activity tolerance;Decreased balance;Decreased mobility;Decreased cognition       PT Treatment Interventions DME instruction;Gait training;Functional mobility training;Therapeutic activities;Therapeutic exercise;Balance training;Patient/family education    PT Goals (Current goals can be found in the Care Plan section)  Acute Rehab PT Goals Patient Stated Goal: Regain strength and find my shoes PT Goal Formulation: With patient Time For Goal Achievement: 05/30/21 Potential to Achieve Goals: Good    Frequency Min 3X/week     Co-evaluation               AM-PAC PT "6 Clicks" Mobility  Outcome Measure Help needed turning from your back to your side while in a flat bed without using bedrails?: None Help needed moving from lying on your back to sitting on the side of a flat bed without using bedrails?: A Little Help needed moving to and from a bed to a chair (including a wheelchair)?: A Little Help needed standing up from a chair using your arms (e.g., wheelchair or bedside chair)?: A Little Help needed to walk in hospital room?: A Little Help needed climbing 3-5 steps with a railing? : A Lot 6 Click Score: 18    End of Session Equipment Utilized During Treatment: Gait belt Activity Tolerance: Patient tolerated treatment well;Patient limited by fatigue Patient left: in chair;with call bell/phone within reach;with chair alarm set;with nursing/sitter in room Nurse Communication: Mobility status PT Visit Diagnosis: Unsteadiness on feet (R26.81);Difficulty in  walking, not elsewhere classified (R26.2)    Time: 1115-1140 PT Time Calculation (min) (ACUTE ONLY): 25 min   Charges:   PT Evaluation $PT Eval Low Complexity: 1 Low PT Treatments $Gait Training: 8-22 mins        Mauro Kaufmann PT Acute Rehabilitation Services Pager [phone] Office [phone]   Daven Montz 05/23/2021, 11:57 AM

## 2021-05-23 NOTE — TOC Initial Note (Addendum)
Transition of Care [facility]) - Initial/Assessment Note   Patient Details  Name: Christine Jennings MRN: [id number] Date of Birth: [birth year]  Transition of Care [facility]) CM/SW Contact:    Sherie Don, LCSW Phone Number: 05/23/2021, 12:34 PM  Clinical Narrative: CSW spoke with patient's son and confirmed that patient is a resident at [facility] and the plan is for patient to return there. CSW discussed transportation with son and son reported there is no local family that can assist with transportation. Son aware patient is too mobile to qualify for PTAR.  CSW notified by RN that Nanine Means cannot arrange for transportation to the facility until tomorrow even though the patient has been discharged. FL2 completed.  Addendum: 2:34pm-CSW spoke with Billy Fischer who works with the patient at [facility] as there is no local family. Per Ms. Harris, the facility only provides transportation Monday-Friday and patient would need wheelchair transport. CSW recommended that Ms. Harris contact local vendors if the ALF is unable to assist with transport.  Ms. Kenton Kingfisher requested that the [facility] and discharge summary be faxed to [place] or [place] at [phone]. CSW faxed FL2 and discharge summary to [facility].  Ms. Kenton Kingfisher also requested that the patient's Ceftin be faxed to [place] at [phone]. CSW explained that TOC does not fax prescriptions, but that information would be given to the hospitalist. Hospitalist updated.  Ms. Kenton Kingfisher said she will try to get the patient's wheelchair to the hospital tomorrow, but does not know if transportation will be set up by then. Bed days updated as patient has been discharged.  Expected Discharge Plan: Assisted Living Barriers to Discharge: Transportation ([facility] unable to arrange transportation until tomorrow. Patient does not meet criteria for PTAR and has no family that live locally.)  Patient Goals and CMS Choice Patient states their  goals for this hospitalization and ongoing recovery are:: Return to [facility] CMS Medicare.gov Compare Post Acute Care list provided to:: Patient Represenative (must comment) Choice offered to / list presented to : Adult Children  Expected Discharge Plan and Services Expected Discharge Plan: Assisted Living In-house Referral: Clinical Social Work Post Acute Care Choice: NA Living arrangements for the past 2 months: [facility] Expected Discharge Date: 05/23/21               DME Arranged: N/A DME Agency: NA  Prior Living Arrangements/Services Living arrangements for the past 2 months: [place] Lives with:: Facility Resident Patient language and need for interpreter reviewed:: Yes Do you feel safe going back to the place where you live?: Yes      Need for Family Participation in Patient Care: Yes (Comment) Care giver support system in place?: Yes (comment) Criminal Activity/Legal Involvement Pertinent to Current Situation/Hospitalization: No - Comment as needed  Activities of Daily Living Home Assistive Devices/Equipment: Environmental consultant (specify type), Eyeglasses, Hearing aid (bilateral hearing aides) ADL Screening (condition at time of admission) Patient's cognitive ability adequate to safely complete daily activities?: No Is the patient deaf or have difficulty hearing?: Yes Does the patient have difficulty seeing, even when wearing glasses/contacts?: No Does the patient have difficulty concentrating, remembering, or making decisions?: Yes Patient able to express need for assistance with ADLs?: Yes Does the patient have difficulty dressing or bathing?: No Independently performs ADLs?: No Communication: Independent Dressing (OT): Needs assistance Is this a change from baseline?: Pre-admission baseline Grooming: Independent Feeding: Independent Bathing: Needs assistance Is this a change from baseline?: Pre-admission baseline Toileting: Needs  assistance  Is this a change from baseline?: Pre-admission baseline In/Out Bed: Needs assistance Is this a change from baseline?: Pre-admission baseline Walks in Home: Needs assistance Is this a change from baseline?: Pre-admission baseline Does the patient have difficulty walking or climbing stairs?: Yes Weakness of Legs: Both Weakness of Arms/Hands: Both  Emotional Assessment Orientation: : Oriented to Self, Oriented to Place Alcohol / Substance Use: Not Applicable Psych Involvement: No (comment)  Admission diagnosis:  Delirium [R41.0] Acute cystitis with hematuria [N30.01] Sepsis secondary to UTI ([place]) [A41.9, N39.0] Sepsis ([place]) [A41.9] Community acquired pneumonia, unspecified laterality [J18.9] Sepsis, due to unspecified organism, unspecified whether acute organ dysfunction present [facility]) [A41.9] Patient Active Problem List   Diagnosis Date Noted   B12 deficiency 05/23/2021   Acute encephalopathy 05/21/2021   Leukocytosis 05/21/2021   Lactic acidosis 05/21/2021   CAP (community acquired pneumonia) 05/21/2021   Sepsis ([place]) 05/21/2021   Sepsis secondary to UTI ([place]) 05/20/2021   Hyperglycemia 06/29/2018   Protein-calorie malnutrition, severe 06/28/2017   PCP:  Hayden Rasmussen, MD Pharmacy:   [facility], [place] [address] Phone: [phone] Fax: [phone]  Readmission Risk Interventions     View : No data to display.

## 2021-05-23 NOTE — Plan of Care (Signed)
  Problem: Fluid Volume: Goal: Hemodynamic stability will improve Outcome: Progressing   Problem: Clinical Measurements: Goal: Diagnostic test results will improve Outcome: Progressing Goal: Signs and symptoms of infection will decrease Outcome: Progressing   Problem: Respiratory: Goal: Ability to maintain adequate ventilation will improve Outcome: Progressing   Pt c/o of mild abdomen distention but has had multiple BMs since admission and is not complaining of any pain at this time.  PT eval today in preparation for discharge back to her ALF.  Pt confused at baseline.

## 2021-05-23 NOTE — Progress Notes (Signed)
Notified Dr.Kakrakandy of consistently elevated blood pressure. Last reading at 5:35 am 183/80.

## 2021-05-23 NOTE — [place] FL2 (Signed)
[place] MEDICAID FL2 LEVEL OF CARE SCREENING TOOL     IDENTIFICATION  Patient Name: Christine Jennings Birthdate: June 23, 1927 Sex: female Admission Date (Current Location): 05/20/2021  [employer] and [place] Number:  Producer, television/film/video and Address:  [facility],  [address] [place], [place] [address]      Provider Number: [id number]  Attending Physician Name and Address:  Glade Lloyd, MD  Relative Name and Phone Number:  Malayjah Otoole (son) Ph: [phone]    Current Level of Care: Hospital Recommended Level of Care: Assisted Living Facility Prior Approval Number:    Date Approved/Denied:   PASRR Number:    Discharge Plan: Other (Comment) [facility])    Current Diagnoses: Patient Active Problem List   Diagnosis Date Noted   B12 deficiency 05/23/2021   Acute encephalopathy 05/21/2021   Leukocytosis 05/21/2021   Lactic acidosis 05/21/2021   CAP (community acquired pneumonia) 05/21/2021   Sepsis (H[facility]) 05/21/2021   Sepsis secondary to UTI (H[facility]) 05/20/2021   Hyperglycemia 06/29/2018   Protein-calorie malnutrition, severe 06/28/2017    Orientation RESPIRATION BLADDER Height & Weight     Self, Place  Normal Incontinent Weight: 113 lb 1.5 oz (51.3 kg) Height:  5\' 2"  (157.5 cm)  BEHAVIORAL SYMPTOMS/MOOD NEUROLOGICAL BOWEL NUTRITION STATUS      Incontinent Diet (Regular diet)  AMBULATORY STATUS COMMUNICATION OF NEEDS Skin   Limited Assist Verbally Other (Comment) (Ecchymosis: right arm, left leg)                       Personal Care Assistance Level of Assistance  Bathing, Feeding, Dressing Bathing Assistance: Limited assistance Feeding assistance: Independent Dressing Assistance: Limited assistance     Functional Limitations Info  Sight, Hearing, Speech Sight Info: Impaired Hearing Info: Impaired Speech Info: Adequate    SPECIAL CARE FACTORS FREQUENCY  PT (By licensed PT)     PT Frequency: 3x's/week at the ALF               Contractures Contractures Info: Not present    Additional Factors Info  Code Status, Allergies Code Status Info: Full Allergies Info: NKA           Current Medications (05/23/2021):  This is the current hospital active medication list Current Facility-Administered Medications  Medication Dose Route Frequency Provider Last Rate Last Admin   acetaminophen (TYLENOL) tablet 650 mg  650 mg Oral Q6H PRN Opyd, 05/25/2021, MD       Or   acetaminophen (TYLENOL) suppository 650 mg  650 mg Rectal Q6H PRN Opyd, Lavone Neri, MD       azithromycin (ZITHROMAX) tablet 500 mg  500 mg Oral Daily Alekh, Kshitiz, MD   500 mg at 05/23/21 0947   cefTRIAXone (ROCEPHIN) 2 g in sodium chloride 0.9 % 100 mL IVPB  2 g Intravenous Q24H Opyd, 05/25/21, MD 200 mL/hr at 05/22/21 1948 2 g at 05/22/21 1948   enoxaparin (LOVENOX) injection 40 mg  40 mg Subcutaneous Q24H 05/24/21, RPH   40 mg at 05/23/21 0947   feeding supplement (ENSURE ENLIVE / ENSURE PLUS) liquid 237 mL  237 mL Oral BID BM Alekh, Kshitiz, MD       folic acid (FOLVITE) tablet 1 mg  1 mg Oral Daily Alekh, Kshitiz, MD   1 mg at 05/23/21 0947   hydrALAZINE (APRESOLINE) injection 5 mg  5 mg Intravenous Q4H PRN 05/25/21, MD   5 mg at 05/23/21 [phone]  lip balm (CARMEX) ointment   Topical PRN Glade Lloyd, MD       multivitamin with minerals tablet 1 tablet  1 tablet Oral Daily Glade Lloyd, MD   1 tablet at 05/23/21 0947   ondansetron (ZOFRAN) tablet 4 mg  4 mg Oral Q6H PRN Opyd, Lavone Neri, MD       Or   ondansetron (ZOFRAN) injection 4 mg  4 mg Intravenous Q6H PRN Opyd, Lavone Neri, MD       sodium chloride flush (NS) 0.9 % injection 3 mL  3 mL Intravenous Q12H Opyd, Lavone Neri, MD   3 mL at 05/23/21 0948   vitamin B-12 (CYANOCOBALAMIN) tablet 1,000 mcg  1,000 mcg Oral Daily Glade Lloyd, MD   1,000 mcg at 05/23/21 [id number]     Discharge Medications: Please see discharge summary for a list of discharge medications.  STOP taking  these medications     nitrofurantoin 100 MG capsule Commonly known as: MACRODANTIN           TAKE these medications     acetaminophen 325 MG tablet Commonly known as: TYLENOL Take 650 mg by mouth every 6 (six) hours as needed for mild pain or fever.    alendronate 70 MG tablet Commonly known as: FOSAMAX Take 70 mg by mouth once a week. Take with a full glass of water on an empty stomach. Monday    amoxicillin 500 MG capsule Commonly known as: AMOXIL Take 1,000 mg by mouth daily as needed (prior to dental work).    aspirin EC 81 MG tablet Take 81 mg by mouth daily. Swallow whole.    CALCIUM 500 +D PO Take 1 tablet by mouth daily.    cefUROXime 500 MG tablet Commonly known as: CEFTIN Take 1 tablet (500 mg total) by mouth 2 (two) times daily for 3 days.    cyanocobalamin 1000 MCG tablet Take 1 tablet (1,000 mcg total) by mouth daily. Start taking on: May 24, 2021    docusate sodium 100 MG capsule Commonly known as: COLACE Take 1 capsule (100 mg total) by mouth daily as needed for mild constipation.    feeding supplement Liqd Take 237 mLs by mouth daily as needed (supplement).    folic acid 1 MG tablet Commonly known as: FOLVITE Take 1 tablet (1 mg total) by mouth daily. Start taking on: May 24, 2021    polyethylene glycol 17 g packet Commonly known as: MIRALAX / GLYCOLAX Take 17 g by mouth daily as needed for mild constipation.    prenatal multivitamin Tabs tablet Take 1 tablet by mouth daily at 12 noon.    Relevant Imaging Results:  Relevant Lab Results:   Additional Information SSN: [id number]  Ewing Schlein, LCSW

## 2021-05-23 NOTE — Discharge Summary (Addendum)
UrinePhysician Discharge Summary  Christine Jennings [id number] DOB: [birth year] DOA: 05/20/2021  PCP: Hayden Rasmussen, MD  Admit date: 05/20/2021 Discharge date: 05/23/2021  Admitted From: ALF Disposition: ALF  Recommendations for Outpatient Follow-up:  Follow up with PCP in 1 week with repeat CBC/BMP Follow up in ED if symptoms worsen or new appear   Home Health: Home health PT  equipment/Devices: None  Discharge Condition: Stable CODE STATUS: Full Diet recommendation: Heart healthy  Brief/Interim Summary: 88 y.o. female with medical history significant for osteoporosis presented with fevers and confusion.  On presentation, patient was febrile to 38.3 C, tachycardic and tachypneic with lactic acidosis and mild leukocytosis.  CT of the head was negative for acute intracranial abnormity.  Chest x-ray was notable for patchy opacities in the left base, atelectasis versus infection.  There was concern for UTI.  She was started on broad-spectrum antibiotics and IV fluids.  During the hospitalization, her condition has improved.  She remains on room air.  Urine culture grew E. coli.  Vitamin B12 supplementation has been started for possible B12 deficiency.  She will be discharged back to ALF today on oral antibiotics for few more days.  Discharge Diagnoses:   Severe sepsis: Present on admission: Resolved UTI: Present on admission Possible community-acquired bacterial pneumonia: Present on admission Leukocytosis: Resolved Lactic acidosis: Resolved -Presented with fever, tachycardia, tachypnea, lactic acidosis, leukocytosis with possible pneumonia and UTI -Currently on Rocephin and add Zithromax.  COVID-19 and influenza negative on presentation.  Blood cultures negative so far.  Urine culture grew pansensitive E. coli. -Currently on room air -Discharge to ALF today on oral Ceftin for 3 more days   Acute metabolic encephalopathy -Possibly from above.  Mental status has much improved.   Continue empiric folic acid.     Possible vitamin B12 deficiency -Vitamin B12 210, at the lower limit of normal.  Continue supplementation on discharge.  Hyponatremia -Resolved  Possible anemia of chronic disease -Hemoglobin stable.  No signs of bleeding.  Monitor as an outpatient Discharge Instructions  Discharge Instructions     Diet - low sodium heart healthy   Complete by: As directed    Increase activity slowly   Complete by: As directed       Allergies as of 05/23/2021   No Known Allergies      Medication List     STOP taking these medications    nitrofurantoin 100 MG capsule Commonly known as: MACRODANTIN       TAKE these medications    acetaminophen 325 MG tablet Commonly known as: TYLENOL Take 650 mg by mouth every 6 (six) hours as needed for mild pain or fever.   alendronate 70 MG tablet Commonly known as: FOSAMAX Take 70 mg by mouth once a week. Take with a full glass of water on an empty stomach. Monday   amoxicillin 500 MG capsule Commonly known as: AMOXIL Take 1,000 mg by mouth daily as needed (prior to dental work).   aspirin EC 81 MG tablet Take 81 mg by mouth daily. Swallow whole.   CALCIUM 500 +D PO Take 1 tablet by mouth daily.   cefUROXime 500 MG tablet Commonly known as: CEFTIN Take 1 tablet (500 mg total) by mouth 2 (two) times daily for 3 days.   cyanocobalamin 1000 MCG tablet Take 1 tablet (1,000 mcg total) by mouth daily. Start taking on: May 24, 2021   docusate sodium 100 MG capsule Commonly known as: COLACE Take 1 capsule (100 mg total) by mouth  daily as needed for mild constipation.   feeding supplement Liqd Take 237 mLs by mouth daily as needed (supplement).   folic acid 1 MG tablet Commonly known as: FOLVITE Take 1 tablet (1 mg total) by mouth daily. Start taking on: May 24, 2021   polyethylene glycol 17 g packet Commonly known as: MIRALAX / GLYCOLAX Take 17 g by mouth daily as needed for mild constipation.    prenatal multivitamin Tabs tablet Take 1 tablet by mouth daily at 12 noon.        No Known Allergies  Consultations: None   Procedures/Studies: CT Head Wo Contrast  Result Date: 05/20/2021 CLINICAL DATA:  Mental status change. EXAM: CT HEAD WITHOUT CONTRAST TECHNIQUE: Contiguous axial images were obtained from the base of the skull through the vertex without intravenous contrast. RADIATION DOSE REDUCTION: This exam was performed according to the departmental dose-optimization program which includes automated exposure control, adjustment of the mA and/or kV according to patient size and/or use of iterative reconstruction technique. COMPARISON:  Head CT 06/29/2018 FINDINGS: Brain: Stable age related cerebral atrophy, ventriculomegaly and severe periventricular white matter disease. No extra-axial fluid collections are identified. No CT findings for acute hemispheric infarction or intracranial hemorrhage. No mass lesions. The brainstem and cerebellum are normal. Vascular: Stable appearing vascular calcifications but no aneurysm or hyperdense vessels. Skull: No skull fracture or bone lesions. Sinuses/Orbits: There is fluid in the left half of the sphenoid sinus which could suggest acute sphenoid sinusitis. The other paranasal sinuses are clear and the mastoid air cells and middle ear cavities are clear. The globes are intact. Other: No scalp lesions or scalp hematoma. IMPRESSION: 1. Stable age related cerebral atrophy, ventriculomegaly and severe periventricular white matter disease. 2. No acute intracranial findings or mass lesions. 3. Fluid in the left half of the sphenoid sinus could suggest acute sphenoid sinusitis. Electronically Signed   By: Marijo Sanes M.D.   On: 05/20/2021 20:54   DG Chest Port 1 View  Result Date: 05/20/2021 CLINICAL DATA:  Questionable sepsis. EXAM: PORTABLE CHEST 1 VIEW COMPARISON:  Chest x-ray 06/29/2018 FINDINGS: There are patchy opacities in scratched at the left  lung base. Cardiomediastinal silhouette is within normal limits. There is no evidence for pleural effusion or pneumothorax. No acute fractures are seen. IMPRESSION: 1. Patchy opacities in the left lung base may represent atelectasis and or infection. Electronically Signed   By: Ronney Asters M.D.   On: 05/20/2021 20:46      Subjective: Patient seen and examined at bedside.  She feels better.  Denies worsening fever, nausea, vomiting or chest pain.  Discharge Exam: Vitals:   05/22/21 2007 05/23/21 0535  BP: (!) 161/84 (!) 183/80  Pulse: 82 75  Resp: 19 18  Temp: 98.8 F (37.1 C) 97.7 F (36.5 C)  SpO2: 93% 92%    General: Pt is alert, awake, not in acute distress.  Elderly female lying in bed.  Currently on room air.  Hard of hearing.  Slow to respond, poor historian Cardiovascular: rate controlled, S1/S2 + Respiratory: bilateral decreased breath sounds at bases Abdominal: Soft, NT, ND, bowel sounds + Extremities: no edema, no cyanosis    The results of significant diagnostics from this hospitalization (including imaging, microbiology, ancillary and laboratory) are listed below for reference.     Microbiology: Recent Results (from the past 240 hour(s))  Resp Panel by RT-PCR (Flu A&B, Covid) Nasopharyngeal Swab     Status: None   Collection Time: 05/20/21  8:27 PM  Specimen: Nasopharyngeal Swab; Nasopharyngeal(NP) swabs in vial transport medium  Result Value Ref Range Status   SARS Coronavirus 2 by RT PCR NEGATIVE NEGATIVE Final    Comment: (NOTE) SARS-CoV-2 target nucleic acids are NOT DETECTED.  The SARS-CoV-2 RNA is generally detectable in upper respiratory specimens during the acute phase of infection. The lowest concentration of SARS-CoV-2 viral copies this assay can detect is 138 copies/mL. A negative result does not preclude SARS-Cov-2 infection and should not be used as the sole basis for treatment or other patient management decisions. A negative result may occur  with  improper specimen collection/handling, submission of specimen other than nasopharyngeal swab, presence of viral mutation(s) within the areas targeted by this assay, and inadequate number of viral copies(<138 copies/mL). A negative result must be combined with clinical observations, patient history, and epidemiological information. The expected result is Negative.  Fact Sheet for Patients:  EntrepreneurPulse.com.au  Fact Sheet for Healthcare Providers:  IncredibleEmployment.be  This test is no t yet approved or cleared by the [place] FDA and  has been authorized for detection and/or diagnosis of SARS-CoV-2 by FDA under an Emergency Use Authorization (EUA). This EUA will remain  in effect (meaning this test can be used) for the duration of the COVID-19 declaration under Section 564(b)(1) of the Act, 21 U.S.C.section 360bbb-3(b)(1), unless the authorization is terminated  or revoked sooner.       Influenza A by PCR NEGATIVE NEGATIVE Final   Influenza B by PCR NEGATIVE NEGATIVE Final    Comment: (NOTE) The Xpert Xpress SARS-CoV-2/FLU/RSV plus assay is intended as an aid in the diagnosis of influenza from Nasopharyngeal swab specimens and should not be used as a sole basis for treatment. Nasal washings and aspirates are unacceptable for Xpert Xpress SARS-CoV-2/FLU/RSV testing.  Fact Sheet for Patients: EntrepreneurPulse.com.au  Fact Sheet for Healthcare Providers: IncredibleEmployment.be  This test is not yet approved or cleared by the [place] FDA and has been authorized for detection and/or diagnosis of SARS-CoV-2 by FDA under an Emergency Use Authorization (EUA). This EUA will remain in effect (meaning this test can be used) for the duration of the COVID-19 declaration under Section 564(b)(1) of the Act, 21 U.S.C. section 360bbb-3(b)(1), unless the authorization is terminated  or revoked.  Performed at [facility], [place] [address] [place], [place] [address]   Blood Culture (routine x 2)     Status: None (Preliminary result)   Collection Time: 05/20/21  8:30 PM   Specimen: BLOOD  Result Value Ref Range Status   Specimen Description   Final    BLOOD BLOOD RIGHT FOREARM Performed at [facility] [address], [place], [place] [address]    Special Requests   Final    BOTTLES DRAWN AEROBIC AND ANAEROBIC Blood Culture results may not be optimal due to an excessive volume of blood received in culture bottles Performed at [facility] [address], [place], [place] [address]    Culture   Final    NO GROWTH 3 DAYS Performed at [facility], [place] [address], [place], [place] [address]    Report Status PENDING  Incomplete  Blood Culture (routine x 2)     Status: None (Preliminary result)   Collection Time: 05/20/21  9:05 PM   Specimen: BLOOD  Result Value Ref Range Status   Specimen Description   Final    BLOOD BLOOD LEFT ARM Performed at [facility] [address] [place], [place]  [address]    Special Requests   Final    BOTTLES DRAWN AEROBIC AND ANAEROBIC Blood Culture adequate volume Performed at [facility] [address], [place], [place] [address]    Culture   Final    NO GROWTH 3 DAYS Performed at [facility], [place] [address], [place], [place] [address]    Report Status PENDING  Incomplete  Urine Culture     Status: Abnormal   Collection Time: 05/20/21  9:34 PM   Specimen: In/Out Cath Urine  Result Value Ref Range Status   Specimen Description   Final    IN/OUT CATH URINE Performed at [facility] [address] [place], [place] [address]    Special Requests   Final    NONE Performed at [facility], [place] [address] [place], [place] [address]    Culture >=100,000 COLONIES/mL ESCHERICHIA  COLI (A)  Final   Report Status 05/22/2021 FINAL  Final   Organism ID, Bacteria ESCHERICHIA COLI (A)  Final      Susceptibility   Escherichia coli - MIC*    AMPICILLIN 4 SENSITIVE Sensitive     CEFAZOLIN <=4 SENSITIVE Sensitive     CEFEPIME <=0.12 SENSITIVE Sensitive     CEFTRIAXONE <=0.25 SENSITIVE Sensitive     CIPROFLOXACIN <=0.25 SENSITIVE Sensitive     GENTAMICIN <=1 SENSITIVE Sensitive     IMIPENEM <=0.25 SENSITIVE Sensitive     NITROFURANTOIN <=16 SENSITIVE Sensitive     TRIMETH/SULFA <=20 SENSITIVE Sensitive     AMPICILLIN/SULBACTAM <=2 SENSITIVE Sensitive     PIP/TAZO <=4 SENSITIVE Sensitive     * >=100,000 COLONIES/mL ESCHERICHIA COLI     Labs: BNP (last 3 results) No results for input(s): BNP in the last 8760 hours. Basic Metabolic Panel: Recent Labs  Lab 05/20/21 2027 05/21/21 0604 05/22/21 0548 05/23/21 0648  NA 143 141 134* 140  K 4.0 3.7 3.6 3.4*  CL 110 109 105 104  CO2 24 27 24 28   GLUCOSE 153* 100* 90 86  BUN 28* 25* 13 10  CREATININE 0.77 0.60 0.57 0.61  CALCIUM 8.7* 8.3* 7.9* 8.4*  MG  --   --  2.0 2.1   Liver Function Tests: Recent Labs  Lab 05/20/21 2027  AST 36  ALT 22  ALKPHOS 61  BILITOT 0.8  PROT 7.0  ALBUMIN 3.4*   No results for input(s): LIPASE, AMYLASE in the last 168 hours. Recent Labs  Lab 05/22/21 0548  AMMONIA 22   CBC: Recent Labs  Lab 05/20/21 2027 05/21/21 0604 05/22/21 0548 05/23/21 0648  WBC 11.3* 7.6 6.4 5.0  NEUTROABS 9.9*  --   --   --   HGB 12.7 11.4* 12.7 12.6  HCT 38.2 34.3* 37.7 37.2  MCV 92.7 94.2 92.9 91.6  PLT 182 151 157 160   Cardiac Enzymes: No results for input(s): CKTOTAL, CKMB, CKMBINDEX, TROPONINI in the last 168 hours. BNP: Invalid input(s): POCBNP CBG: No results for input(s): GLUCAP in the last 168 hours. D-Dimer No results for input(s): DDIMER in the last 72 hours. Hgb A1c No results for input(s): HGBA1C in the last 72 hours. Lipid Profile No results for input(s): CHOL, HDL,  LDLCALC, TRIG, CHOLHDL, LDLDIRECT in the last 72 hours. Thyroid function studies Recent Labs    05/22/21 0548  TSH 2.954   Anemia work up Recent Labs    05/22/21 0548  VITAMINB12 210   Urinalysis    Component Value Date/Time   COLORURINE  YELLOW 05/20/2021 2134   APPEARANCEUR CLOUDY (A) 05/20/2021 2134   LABSPEC 1.018 05/20/2021 2134   PHURINE 5.0 05/20/2021 2134   GLUCOSEU NEGATIVE 05/20/2021 2134   HGBUR MODERATE (A) 05/20/2021 2134   BILIRUBINUR NEGATIVE 05/20/2021 2134   [place] 05/20/2021 2134   PROTEINUR 30 (A) 05/20/2021 2134   NITRITE NEGATIVE 05/20/2021 2134   LEUKOCYTESUR LARGE (A) 05/20/2021 2134   Sepsis Labs Invalid input(s): PROCALCITONIN,  WBC,  LACTICIDVEN Microbiology Recent Results (from the past 240 hour(s))  Resp Panel by RT-PCR (Flu A&B, Covid) Nasopharyngeal Swab     Status: None   Collection Time: 05/20/21  8:27 PM   Specimen: Nasopharyngeal Swab; Nasopharyngeal(NP) swabs in vial transport medium  Result Value Ref Range Status   SARS Coronavirus 2 by RT PCR NEGATIVE NEGATIVE Final    Comment: (NOTE) SARS-CoV-2 target nucleic acids are NOT DETECTED.  The SARS-CoV-2 RNA is generally detectable in upper respiratory specimens during the acute phase of infection. The lowest concentration of SARS-CoV-2 viral copies this assay can detect is 138 copies/mL. A negative result does not preclude SARS-Cov-2 infection and should not be used as the sole basis for treatment or other patient management decisions. A negative result may occur with  improper specimen collection/handling, submission of specimen other than nasopharyngeal swab, presence of viral mutation(s) within the areas targeted by this assay, and inadequate number of viral copies(<138 copies/mL). A negative result must be combined with clinical observations, patient history, and epidemiological information. The expected result is Negative.  Fact Sheet for Patients:   EntrepreneurPulse.com.au  Fact Sheet for Healthcare Providers:  IncredibleEmployment.be  This test is no t yet approved or cleared by the [place] FDA and  has been authorized for detection and/or diagnosis of SARS-CoV-2 by FDA under an Emergency Use Authorization (EUA). This EUA will remain  in effect (meaning this test can be used) for the duration of the COVID-19 declaration under Section 564(b)(1) of the Act, 21 U.S.C.section 360bbb-3(b)(1), unless the authorization is terminated  or revoked sooner.       Influenza A by PCR NEGATIVE NEGATIVE Final   Influenza B by PCR NEGATIVE NEGATIVE Final    Comment: (NOTE) The Xpert Xpress SARS-CoV-2/FLU/RSV plus assay is intended as an aid in the diagnosis of influenza from Nasopharyngeal swab specimens and should not be used as a sole basis for treatment. Nasal washings and aspirates are unacceptable for Xpert Xpress SARS-CoV-2/FLU/RSV testing.  Fact Sheet for Patients: EntrepreneurPulse.com.au  Fact Sheet for Healthcare Providers: IncredibleEmployment.be  This test is not yet approved or cleared by the [place] FDA and has been authorized for detection and/or diagnosis of SARS-CoV-2 by FDA under an Emergency Use Authorization (EUA). This EUA will remain in effect (meaning this test can be used) for the duration of the COVID-19 declaration under Section 564(b)(1) of the Act, 21 U.S.C. section 360bbb-3(b)(1), unless the authorization is terminated or revoked.  Performed at [facility], [place] [address] [place], [place] [address]   Blood Culture (routine x 2)     Status: None (Preliminary result)   Collection Time: 05/20/21  8:30 PM   Specimen: BLOOD  Result Value Ref Range Status   Specimen Description   Final    BLOOD BLOOD RIGHT FOREARM Performed at [facility] [address], [place], [place] [address]     Special Requests   Final    BOTTLES DRAWN AEROBIC AND ANAEROBIC Blood Culture results may not be optimal due to an excessive volume  of blood received in culture bottles Performed at [facility], [place] [address] [place], [place] [address]    Culture   Final    NO GROWTH 3 DAYS Performed at [facility], [place] [address] [place], [place] [address]    Report Status PENDING  Incomplete  Blood Culture (routine x 2)     Status: None (Preliminary result)   Collection Time: 05/20/21  9:05 PM   Specimen: BLOOD  Result Value Ref Range Status   Specimen Description   Final    BLOOD BLOOD LEFT ARM Performed at [facility] [address] [place], [place] [address]    Special Requests   Final    BOTTLES DRAWN AEROBIC AND ANAEROBIC Blood Culture adequate volume Performed at [facility] [address] [place], [place] [address]    Culture   Final    NO GROWTH 3 DAYS Performed at [facility], [place] [address] [place], [place] [address]    Report Status PENDING  Incomplete  Urine Culture     Status: Abnormal   Collection Time: 05/20/21  9:34 PM   Specimen: In/Out Cath Urine  Result Value Ref Range Status   Specimen Description   Final    IN/OUT CATH URINE Performed at [facility] [address], [place], [place] [address]    Special Requests   Final    NONE Performed at [facility], [place] [address], [place], [place] [address]    Culture >=100,000 COLONIES/mL ESCHERICHIA COLI (A)  Final   Report Status 05/22/2021 FINAL  Final   Organism ID, Bacteria ESCHERICHIA COLI (A)  Final      Susceptibility   Escherichia coli - MIC*    AMPICILLIN 4 SENSITIVE Sensitive     CEFAZOLIN <=4 SENSITIVE Sensitive     CEFEPIME <=0.12 SENSITIVE Sensitive     CEFTRIAXONE <=0.25 SENSITIVE Sensitive     CIPROFLOXACIN <=0.25 SENSITIVE Sensitive     GENTAMICIN <=1 SENSITIVE Sensitive     IMIPENEM  <=0.25 SENSITIVE Sensitive     NITROFURANTOIN <=16 SENSITIVE Sensitive     TRIMETH/SULFA <=20 SENSITIVE Sensitive     AMPICILLIN/SULBACTAM <=2 SENSITIVE Sensitive     PIP/TAZO <=4 SENSITIVE Sensitive     * >=100,000 COLONIES/mL ESCHERICHIA COLI     Time coordinating discharge: 35 minutes  SIGNED:   Aline August, MD  [facility] 05/23/2021, 10:55 AM

## 2021-05-24 DIAGNOSIS — E538 Deficiency of other specified B group vitamins: Secondary | ICD-10-CM

## 2021-05-24 MED ORDER — CYANOCOBALAMIN 1000 MCG PO TABS: 1000.0000 ug | ORAL_TABLET | Freq: Every day | ORAL | 0 refills | Status: AC

## 2021-05-24 MED ORDER — CEFUROXIME AXETIL 500 MG PO TABS: 500.0000 mg | ORAL_TABLET | Freq: Two times a day (BID) | ORAL | 0 refills | Status: AC

## 2021-05-24 MED ORDER — FOLIC ACID 1 MG PO TABS: 1.0000 mg | ORAL_TABLET | Freq: Every day | ORAL | 0 refills | Status: AC

## 2021-05-24 NOTE — TOC Progression Note (Signed)
Transition of Care [facility]) - Progression Note    Patient Details  Name: TERRYANN VERBEEK MRN: [id number] Date of Birth: [birth year]  Transition of Care [facility]) CM/SW Contact  Larrie Kass, LCSW Phone Number: 05/24/2021, 11:19 AM  Clinical Narrative:     CSW attempted to contact Stormy Card to get an update on transportation, no response, left HIPPA compliant VM.  CSW spoke with pt's son Maryanna Stuber, he stated his frustration with the hospital. He last time he spoke with Victorino Dike last night she was arranging for someone to help pick pt up this afternoon. Pt's son reported not knowing what time it would be. TOC to follow.    Expected Discharge Plan: Assisted Living Barriers to Discharge: Transportation [facility] unable to arrange transportation until tomorrow. Patient does not meet criteria for PTAR and has no family that live locally.)  Expected Discharge Plan and Services Expected Discharge Plan: Assisted Living In-house Referral: Clinical Social Work   Post Acute Care Choice: NA Living arrangements for the past 2 months: Assisted Living Facility Expected Discharge Date: 05/23/21               DME Arranged: N/A DME Agency: NA                   Social Determinants of Health (SDOH) Interventions    Readmission Risk Interventions     View : No data to display.

## 2021-05-24 NOTE — Progress Notes (Signed)
Called [facility] where patient was residing before her hospital admission and that's where she was returning to. Her care giver transported her there. When I called I only got answering machines, I did leave a message and number to call back to get a report. Also she did not take her prescriptions and I was going to try and fax them to [facility]. No one called me back.

## 2021-05-24 NOTE — Progress Notes (Signed)
Patient waiting to be discharged to ALF.  She is currently medically stable for discharge.  Please refer to the full discharge summary done by me on 05/23/2021 for full details.  Patient seen and examined at bedside today.

## 2021-05-25 LAB — CULTURE, BLOOD (ROUTINE X 2)
Culture: NO GROWTH
Culture: NO GROWTH
Special Requests: ADEQUATE

## 2022-02-23 ENCOUNTER — Other Ambulatory Visit: Payer: Self-pay | Admitting: Family Medicine

## 2022-02-23 ENCOUNTER — Ambulatory Visit
Admission: RE | Admit: 2022-02-23 | Discharge: 2022-02-23 | Disposition: A | Discharge status: Home / Self Care | Payer: Medicare Other | Source: Ambulatory Visit | Attending: Family Medicine | Admitting: Family Medicine

## 2022-02-23 DIAGNOSIS — K5900 Constipation, unspecified: Secondary | ICD-10-CM

## 2023-05-22 IMAGING — CR DG HIP (WITH OR WITHOUT PELVIS) 2-3V*L*
4 series · 4 of 4 positions shown · non-contrast
Comparison: Intraoperative left hip fluoroscopy images dated
06/30/2018.

CLINICAL DATA: Left knee pain after falling in the bathroom this
morning.

EXAM:
DG HIP (WITH OR WITHOUT PELVIS) 2-3V LEFT

[x pelvis (1 of 2)]
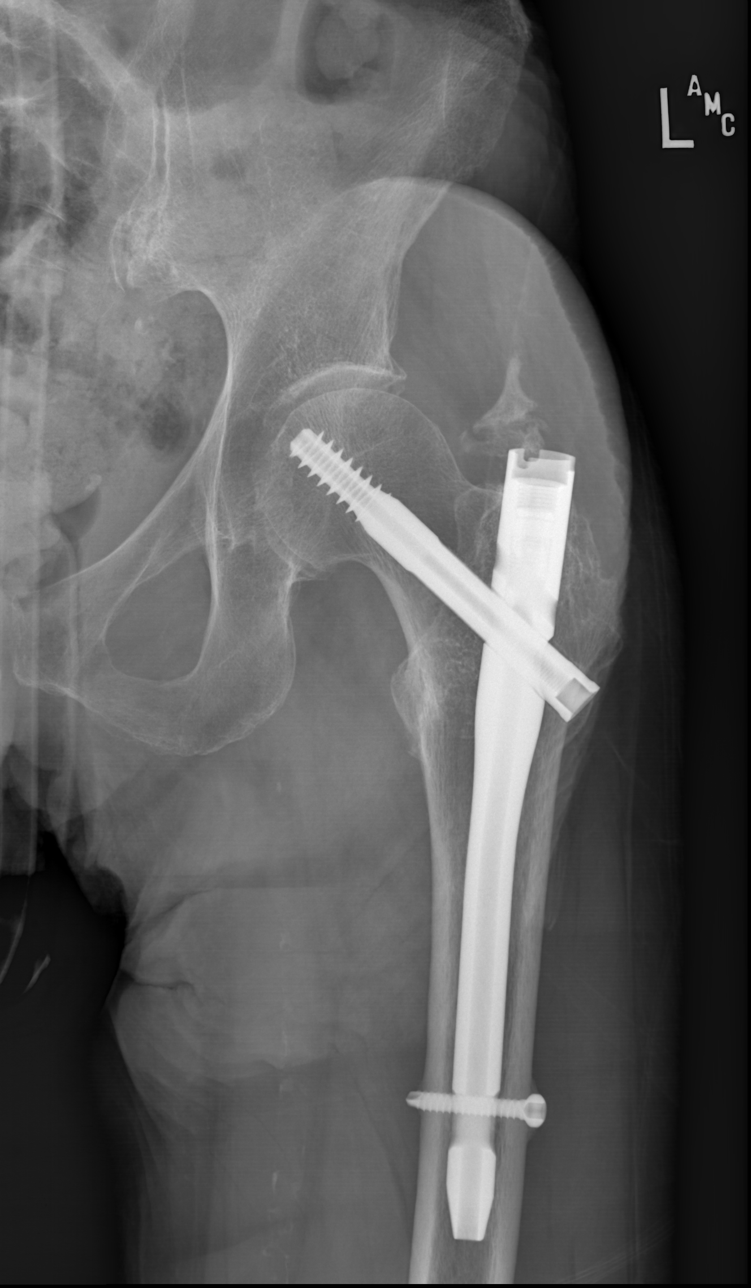

[x pelvis (2 of 2)]
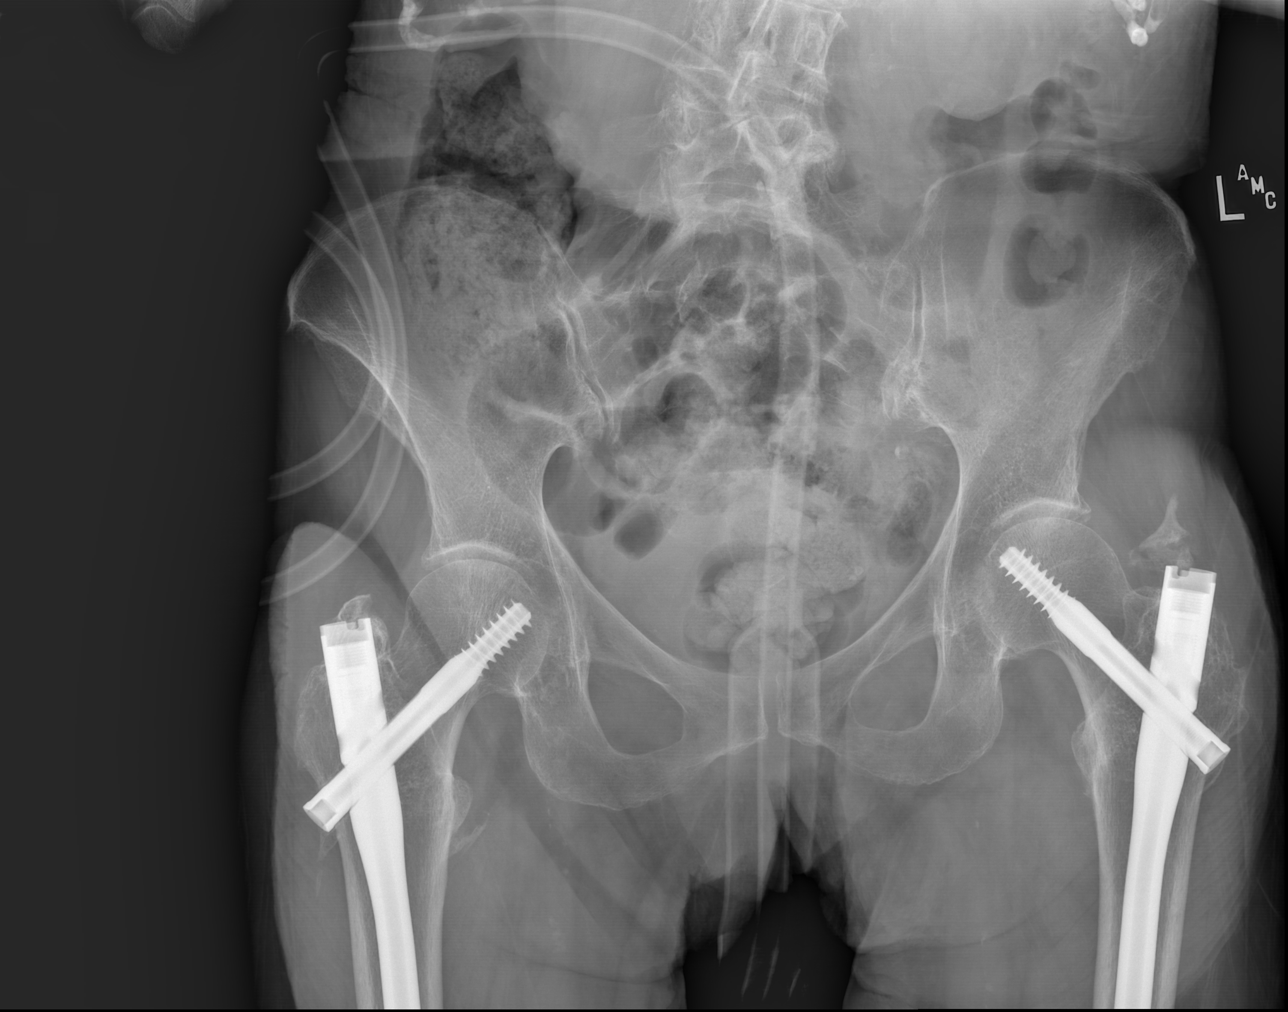

[x hip ap left]
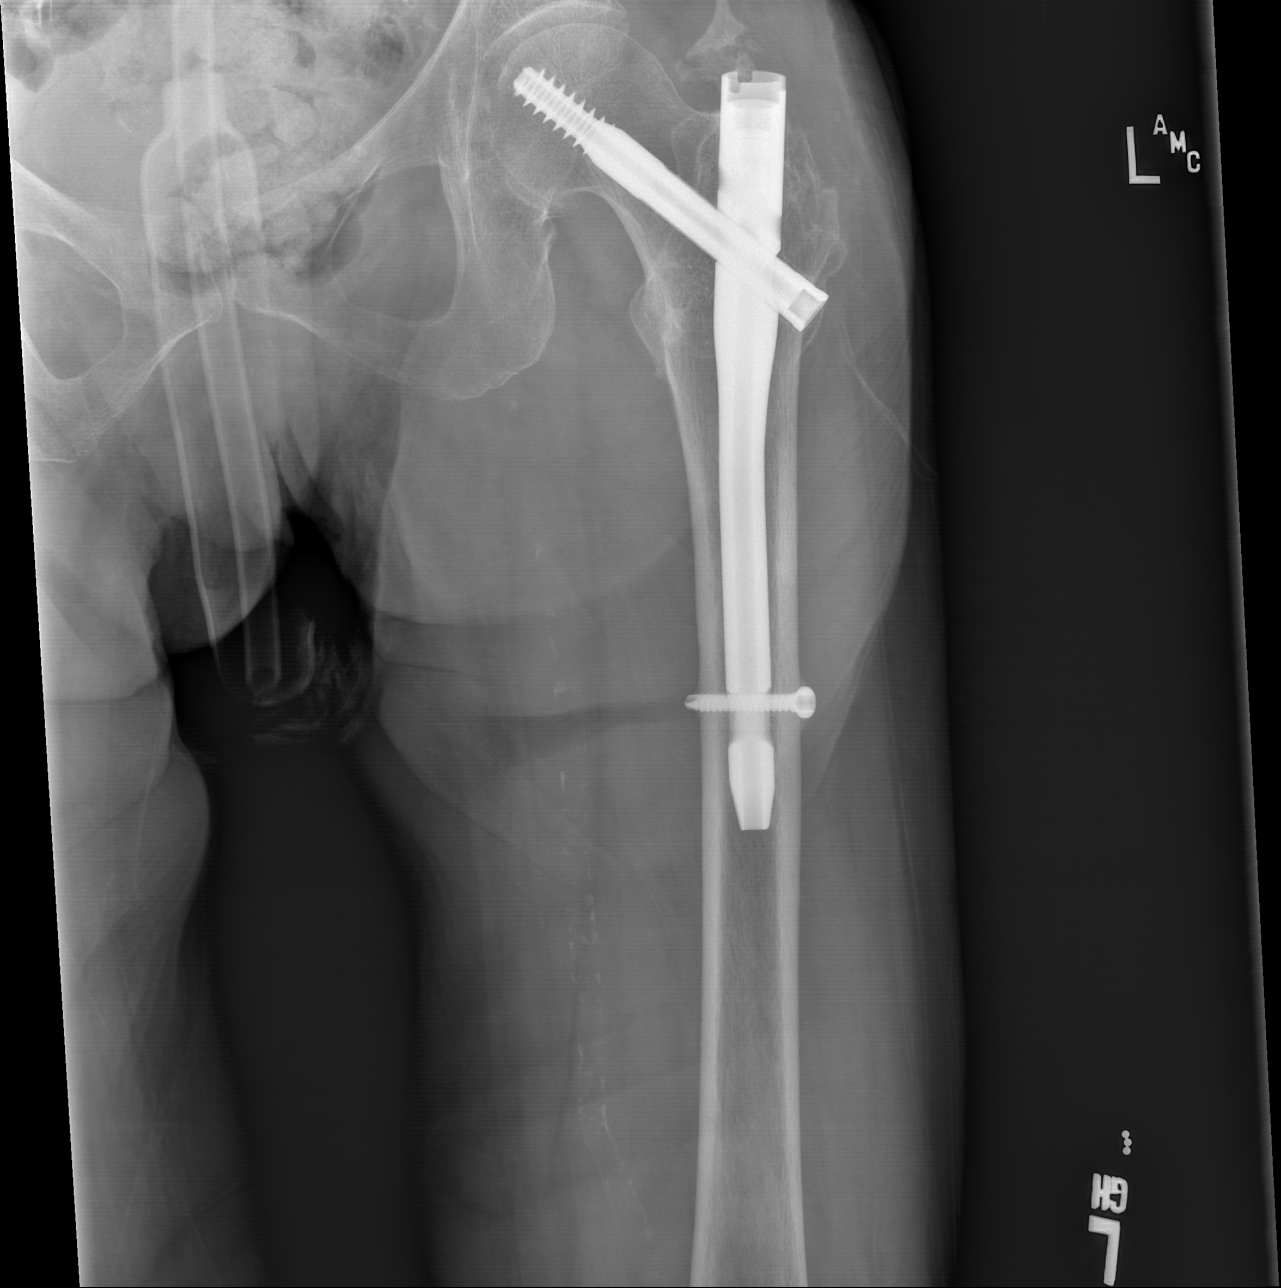

[x hip lat left]
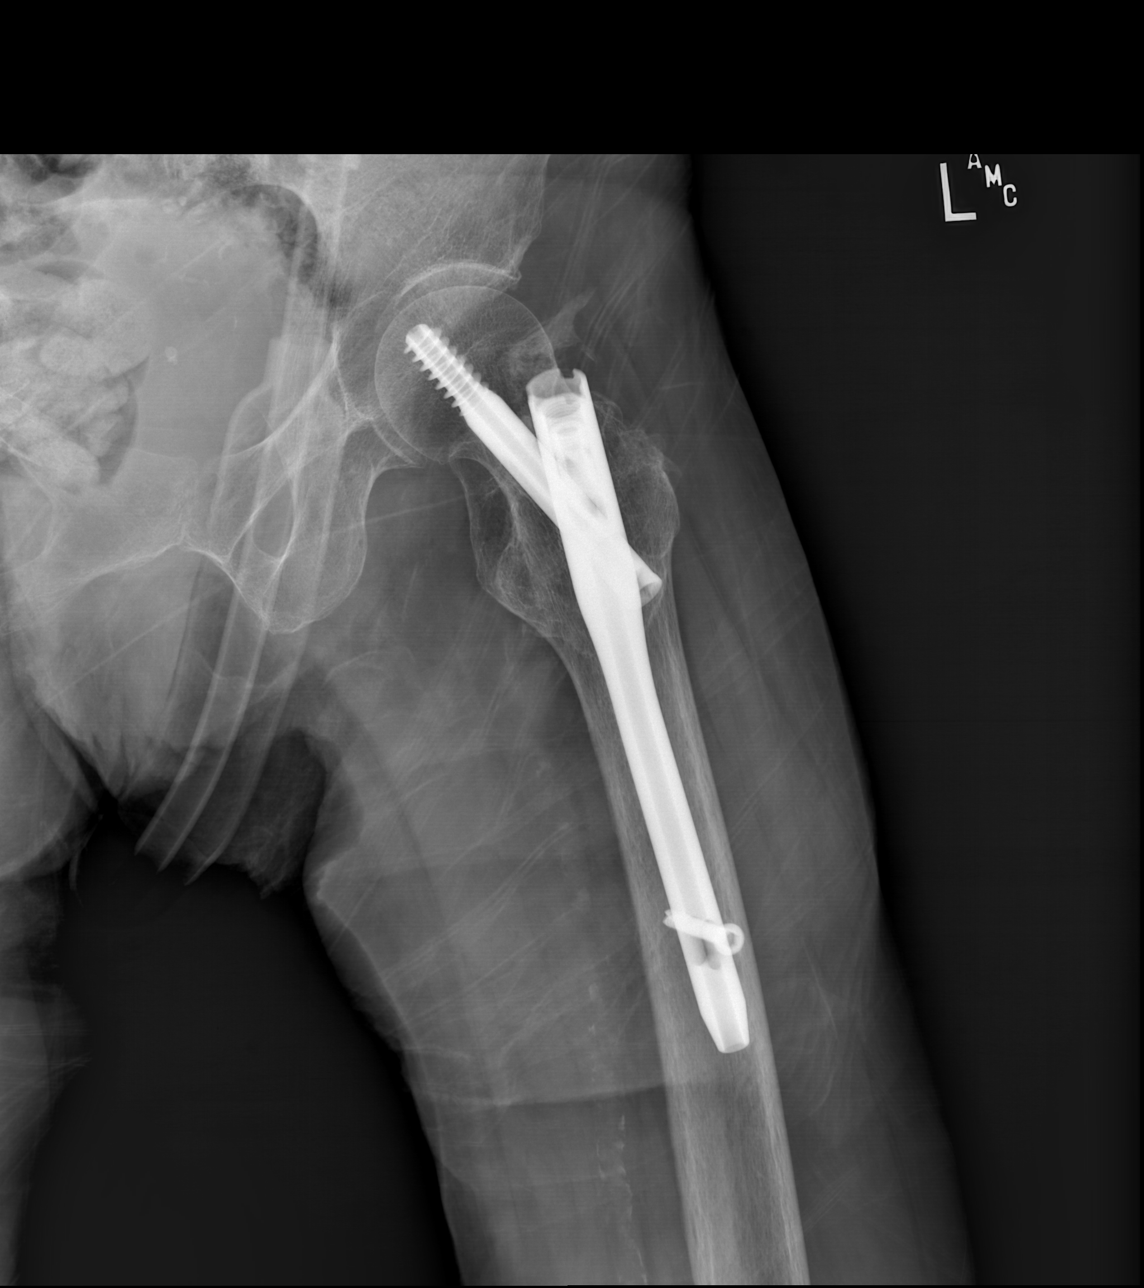

[4 of 4 positions shown; findings below may reference images not displayed]

FINDINGS: The patient is status post intramedullary nail and femoral neck
screw bilaterally. No evidence of hardware failure. There is no
evidence of hip fracture or dislocation. Degenerative changes are
seen in the spine.
IMPRESSION: No acute osseous injury.
